# Patient Record
Sex: Female | Born: 1950 | Race: White | Hispanic: No | Marital: Married | Smoking: Current some day smoker
Health system: Southern US, Community
[De-identification: ages and names within clinical notes are randomized; demographics above are authoritative.]

## PROBLEM LIST (undated history)

## (undated) DIAGNOSIS — R0600 Dyspnea, unspecified: Secondary | ICD-10-CM

## (undated) DIAGNOSIS — M1990 Unspecified osteoarthritis, unspecified site: Secondary | ICD-10-CM

## (undated) DIAGNOSIS — I4891 Unspecified atrial fibrillation: Secondary | ICD-10-CM

## (undated) DIAGNOSIS — T8209XA Other mechanical complication of heart valve prosthesis, initial encounter: Secondary | ICD-10-CM

## (undated) DIAGNOSIS — I10 Essential (primary) hypertension: Secondary | ICD-10-CM

## (undated) DIAGNOSIS — Z953 Presence of xenogenic heart valve: Secondary | ICD-10-CM

## (undated) DIAGNOSIS — I253 Aneurysm of heart: Secondary | ICD-10-CM

## (undated) DIAGNOSIS — Z9889 Other specified postprocedural states: Secondary | ICD-10-CM

## (undated) DIAGNOSIS — D649 Anemia, unspecified: Secondary | ICD-10-CM

## (undated) DIAGNOSIS — I499 Cardiac arrhythmia, unspecified: Secondary | ICD-10-CM

## (undated) DIAGNOSIS — E7800 Pure hypercholesterolemia, unspecified: Secondary | ICD-10-CM

## (undated) DIAGNOSIS — K219 Gastro-esophageal reflux disease without esophagitis: Secondary | ICD-10-CM

## (undated) DIAGNOSIS — I2699 Other pulmonary embolism without acute cor pulmonale: Secondary | ICD-10-CM

## (undated) DIAGNOSIS — I350 Nonrheumatic aortic (valve) stenosis: Secondary | ICD-10-CM

## (undated) DIAGNOSIS — G510 Bell's palsy: Secondary | ICD-10-CM

## (undated) DIAGNOSIS — H269 Unspecified cataract: Secondary | ICD-10-CM

## (undated) DIAGNOSIS — I729 Aneurysm of unspecified site: Secondary | ICD-10-CM

## (undated) DIAGNOSIS — E669 Obesity, unspecified: Secondary | ICD-10-CM

## (undated) HISTORY — DX: Other mechanical complication of heart valve prosthesis, initial encounter: T82.09XA

## (undated) HISTORY — DX: Nonrheumatic aortic (valve) stenosis: I35.0

## (undated) HISTORY — DX: Unspecified cataract: H26.9

## (undated) HISTORY — DX: Aneurysm of unspecified site: I72.9

## (undated) HISTORY — DX: Aneurysm of heart: I25.3

## (undated) HISTORY — PX: CATARACT EXTRACTION: SUR2

## (undated) HISTORY — PX: PARS PLANA VITRECTOMY W/ REPAIR OF MACULAR HOLE: SHX2170

## (undated) HISTORY — DX: Other specified postprocedural states: Z98.890

## (undated) HISTORY — PX: KNEE ARTHROSCOPY: SUR90

## (undated) HISTORY — PX: EYE SURGERY: SHX253

## (undated) HISTORY — DX: Obesity, unspecified: E66.9

## (undated) HISTORY — DX: Unspecified atrial fibrillation: I48.91

## (undated) HISTORY — DX: Anemia, unspecified: D64.9

## (undated) HISTORY — DX: Bell's palsy: G51.0

## (undated) HISTORY — DX: Essential (primary) hypertension: I10

## (undated) HISTORY — DX: Pure hypercholesterolemia, unspecified: E78.00

## (undated) HISTORY — DX: Other pulmonary embolism without acute cor pulmonale: I26.99

---

## 1997-11-12 ENCOUNTER — Other Ambulatory Visit: Admission: RE | Admit: 1997-11-12 | Discharge: 1997-11-12 | Payer: Self-pay | Admitting: Gynecology

## 1999-10-04 ENCOUNTER — Other Ambulatory Visit: Admission: RE | Admit: 1999-10-04 | Discharge: 1999-10-04 | Payer: Self-pay | Admitting: Gynecology

## 2001-05-30 ENCOUNTER — Other Ambulatory Visit: Admission: RE | Admit: 2001-05-30 | Discharge: 2001-05-30 | Payer: Self-pay | Admitting: Gynecology

## 2002-10-27 ENCOUNTER — Other Ambulatory Visit: Admission: RE | Admit: 2002-10-27 | Discharge: 2002-10-27 | Payer: Self-pay | Admitting: Gynecology

## 2003-03-21 DIAGNOSIS — G510 Bell's palsy: Secondary | ICD-10-CM

## 2003-03-21 HISTORY — DX: Bell's palsy: G51.0

## 2003-09-03 ENCOUNTER — Emergency Department (HOSPITAL_COMMUNITY): Admission: EM | Admit: 2003-09-03 | Discharge: 2003-09-03 | Payer: Self-pay | Admitting: Emergency Medicine

## 2004-08-31 ENCOUNTER — Ambulatory Visit (HOSPITAL_COMMUNITY): Admission: RE | Admit: 2004-08-31 | Discharge: 2004-08-31 | Payer: Self-pay

## 2008-05-04 ENCOUNTER — Ambulatory Visit (HOSPITAL_COMMUNITY): Admission: RE | Admit: 2008-05-04 | Discharge: 2008-05-04 | Payer: Self-pay | Admitting: Internal Medicine

## 2008-05-04 ENCOUNTER — Encounter: Payer: Self-pay | Admitting: Cardiology

## 2008-05-19 ENCOUNTER — Ambulatory Visit: Payer: Self-pay | Admitting: Cardiology

## 2008-05-29 ENCOUNTER — Ambulatory Visit: Payer: Self-pay | Admitting: Cardiology

## 2008-05-29 ENCOUNTER — Ambulatory Visit: Payer: Self-pay

## 2008-05-29 LAB — CONVERTED CEMR LAB
Basophils Relative: 0.6 % (ref 0.0–3.0)
Eosinophils Absolute: 0.1 10*3/uL (ref 0.0–0.7)
Eosinophils Relative: 1.3 % (ref 0.0–5.0)
HDL: 44.4 mg/dL (ref >39.0)
Lymphocytes Relative: 39.2 % (ref 12.0–46.0)
MCV: 91.9 fL (ref 78.0–100.0)
Monocytes Relative: 7.5 % (ref 3.0–12.0)
Neutrophils Relative %: 51.4 % (ref 43.0–77.0)
Platelets: 284 10*3/uL (ref 150–400)
RBC: 4.33 M/uL (ref 3.87–5.11)
Total CHOL/HDL Ratio: 4.7
Triglycerides: 65 mg/dL (ref 0–149)
VLDL: 13 mg/dL (ref 0–40)
WBC: 8.1 10*3/uL (ref 4.5–10.5)

## 2008-06-01 ENCOUNTER — Encounter: Payer: Self-pay | Admitting: Cardiology

## 2008-06-01 ENCOUNTER — Ambulatory Visit: Payer: Self-pay | Admitting: Cardiology

## 2008-06-01 DIAGNOSIS — E7800 Pure hypercholesterolemia, unspecified: Secondary | ICD-10-CM

## 2008-06-01 DIAGNOSIS — I359 Nonrheumatic aortic valve disorder, unspecified: Secondary | ICD-10-CM | POA: Insufficient documentation

## 2008-06-01 DIAGNOSIS — R0602 Shortness of breath: Secondary | ICD-10-CM | POA: Insufficient documentation

## 2008-06-01 DIAGNOSIS — I10 Essential (primary) hypertension: Secondary | ICD-10-CM

## 2008-06-01 DIAGNOSIS — F17200 Nicotine dependence, unspecified, uncomplicated: Secondary | ICD-10-CM | POA: Insufficient documentation

## 2008-07-13 ENCOUNTER — Ambulatory Visit: Payer: Self-pay | Admitting: Cardiology

## 2008-07-13 LAB — CONVERTED CEMR LAB
ALT: 17 units/L (ref 0–35)
Albumin: 3.9 g/dL (ref 3.5–5.2)
Alkaline Phosphatase: 80 units/L (ref 39–117)
Bilirubin, Direct: 0.1 mg/dL (ref 0.0–0.3)
Total Protein: 6.7 g/dL (ref 6.0–8.3)

## 2008-07-17 LAB — CONVERTED CEMR LAB
Cholesterol: 139 mg/dL (ref 0–200)
Magnesium: 2.2 mg/dL (ref 1.5–2.5)
Triglycerides: 41 mg/dL (ref 0.0–149.0)
VLDL: 8.2 mg/dL (ref 0.0–40.0)

## 2008-11-09 ENCOUNTER — Ambulatory Visit (HOSPITAL_BASED_OUTPATIENT_CLINIC_OR_DEPARTMENT_OTHER): Admission: RE | Admit: 2008-11-09 | Discharge: 2008-11-09 | Payer: Self-pay | Admitting: Specialist

## 2009-12-21 ENCOUNTER — Telehealth: Payer: Self-pay | Admitting: Cardiology

## 2010-01-03 ENCOUNTER — Telehealth: Payer: Self-pay | Admitting: Cardiology

## 2010-01-25 ENCOUNTER — Ambulatory Visit: Payer: Self-pay | Admitting: Cardiology

## 2010-01-26 ENCOUNTER — Encounter (INDEPENDENT_AMBULATORY_CARE_PROVIDER_SITE_OTHER): Payer: Self-pay | Admitting: *Deleted

## 2010-01-26 LAB — CONVERTED CEMR LAB
ALT: 15 units/L (ref 0–35)
AST: 18 units/L (ref 0–37)
Alkaline Phosphatase: 88 units/L (ref 39–117)
Total Bilirubin: 0.7 mg/dL (ref 0.3–1.2)
Total CHOL/HDL Ratio: 4
Triglycerides: 71 mg/dL (ref 0.0–149.0)

## 2010-02-09 ENCOUNTER — Ambulatory Visit: Payer: Self-pay | Admitting: Cardiovascular Disease

## 2010-02-09 ENCOUNTER — Ambulatory Visit (HOSPITAL_COMMUNITY): Admission: RE | Admit: 2010-02-09 | Discharge: 2010-02-09 | Payer: Self-pay | Admitting: Cardiology

## 2010-02-09 ENCOUNTER — Telehealth: Payer: Self-pay | Admitting: Cardiology

## 2010-02-09 ENCOUNTER — Ambulatory Visit: Payer: Self-pay

## 2010-02-09 ENCOUNTER — Encounter: Payer: Self-pay | Admitting: Cardiology

## 2010-02-25 ENCOUNTER — Telehealth: Payer: Self-pay | Admitting: Cardiology

## 2010-03-01 ENCOUNTER — Ambulatory Visit (HOSPITAL_COMMUNITY)
Admission: RE | Admit: 2010-03-01 | Discharge: 2010-03-01 | Payer: Self-pay | Source: Home / Self Care | Attending: Internal Medicine | Admitting: Internal Medicine

## 2010-03-07 ENCOUNTER — Telehealth: Payer: Self-pay | Admitting: Cardiology

## 2010-04-10 ENCOUNTER — Encounter: Payer: Self-pay | Admitting: Otolaryngology

## 2010-04-10 ENCOUNTER — Encounter: Payer: Self-pay | Admitting: Cardiology

## 2010-04-21 NOTE — Progress Notes (Signed)
Summary: pt having sob x 1week since starting norvasc   Phone Note Call from Patient   Caller: Patient [phone] Reason for Call: Talk to Nurse Summary of Call: pt having sob since starting new med-pls advise Initial call taken by: Glynda Jaeger,  February 25, 2010 9:00 AM  Follow-up for Phone Call        Left message to call back Deliah Goody, RN  February 25, 2010 12:48 PM  pt returning call call back [phone] Judie Grieve  February 25, 2010 12:54 PM Left message to call back Deliah Goody, RN  February 25, 2010 3:38 PM  Additional Follow-up for Phone Call Additional follow up Details #1::        pt rtn call she is at home now Omer Jack  February 25, 2010 3:52 PM   spoke with pt, she reports since starting the norvasc she has been SOB. she denies edema. she did not take the norvasc today and feels better. will foward for dr Jens Som review Deliah Goody, RN  February 25, 2010 4:43 PM    Additional Follow-up for Phone Call Additional follow up Details #2::    follow BP off norvasc Ferman Hamming, MD, [facility]  February 25, 2010 5:40 PM  pt aware Deliah Goody, RN  February 25, 2010 5:50 PM

## 2010-04-21 NOTE — Progress Notes (Signed)
Summary: order for blood work?   Phone Note Call from Patient   Caller: Patient [phone] ok to leave message Reason for Call: Talk to Nurse Complaint: Cough/Sore throat Summary of Call: pt called to make a past due appt, wants to know if needs blood work prior? just let me know and i'll call her and make her appt Initial call taken by: Glynda Jaeger,  January 03, 2010 9:09 AM  Follow-up for Phone Call        left mess on ID vm doesn't look like labs needed at this time, liver/lipid ok in April Terri Staggers, RN  January 03, 2010 11:06 AM

## 2010-04-21 NOTE — Progress Notes (Signed)
Summary: calling about labs   Phone Note Call from Patient Call back at Home Phone [phone]   Caller: Patient Summary of Call: Pt calling regarding labs,said she have not had labs since last year Initial call taken by: Judie Grieve,  January 03, 2010 3:02 PM  Follow-up for Phone Call        Left message to call back Meredith Staggers, RN  January 03, 2010 3:14 PM   Additional Follow-up for Phone Call Additional follow up Details #1::        rtn your call Omer Jack  January 03, 2010 4:06 PM   spoke w/pt labs were from April of 2010, she is due for liver/lipid she can't come in before appt she will have done same day Meredith Staggers, RN  January 03, 2010 4:54 PM

## 2010-04-21 NOTE — Assessment & Plan Note (Signed)
Summary: follow up appt  Medications Added OMEPRAZOLE 40 MG CPDR (OMEPRAZOLE) 1 tab by mouth once daily SIMVASTATIN 40 MG TABS (SIMVASTATIN) Take one tablet by mouth daily at bedtime METOPROLOL SUCCINATE 50 MG XR24H-TAB (METOPROLOL SUCCINATE) Take one tablet by mouth daily ASPIRIN 81 MG TBEC (ASPIRIN) Take one tablet by mouth daily        CC:  check up.  History of Present Illness: Pleasant female with aortic stenosis who presents in followup. She did have an echocardiogram performed on May 01, 2008. She was found to have normal LV function and moderate aortic stenosis with a mean gradient of 33 mm of mercury. She also had carotid Dopplers on May 29, 2008 that showed 0-39% bilateral stenosis. I last saw her in March 2010. Since then, the patient has dyspnea with more extreme activities but not with routine activities. It is relieved with rest. It is not associated with chest pain. There is no orthopnea, PND or pedal edema. There is no syncope or palpitations. There is no exertional chest pain.   Current Medications (verified): 1)  Omeprazole 40 Mg Cpdr (Omeprazole) .Marland Kitchen.. 1 Tab By Mouth Once Daily 2)  Motrin .... As Needed 3)  Simvastatin 40 Mg Tabs (Simvastatin) .... Take One Tablet By Mouth Daily At Bedtime 4)  Metoprolol Succinate 25 Mg Xr24h-Tab (Metoprolol Succinate) .... Take One Tablet By Mouth Daily As Needed  Past History:  Past Medical History: HYPERTENSION, BENIGN ESSENTIAL  AORTIC STENOSIS  HYPERCHOLESTEROLEMIA-PURE   Social History: Reviewed history from 06/01/2008 and no changes required. Works locally in [employer] Married  Tobacco Use - Yes.   Review of Systems       Occasional myalgias  but no fevers or chills, productive cough, hemoptysis, dysphasia, odynophagia, melena, hematochezia, dysuria, hematuria, rash, seizure activity, orthopnea, PND, pedal edema, claudication. Remaining systems are negative.   Vital Signs:  Patient profile:   60 year old  female Height:      63 inches Weight:      183 pounds BMI:     32.53 Pulse rate:   74 / minute Resp:     14 per minute BP sitting:   153 / 77  (left arm)  Vitals Entered By: Kem Parkinson (January 25, 2010 8:56 AM)  Physical Exam  General:  Well-developed well-nourished in no acute distress.  Skin is warm and dry.  HEENT is normal.  Neck is supple. No thyromegaly.  Chest is clear to auscultation with normal expansion.  Cardiovascular exam is regular rate and rhythm. 3/6 systolic murmur left sternal border radiating to the carotids. Abdominal exam nontender or distended. No masses palpated. Extremities show no edema. neuro grossly intact    EKG  Procedure date:  01/25/2010  Findings:      Sinus rhythm at a rate of 74. Axis normal. No ST changes.  Impression & Recommendations:  Problem # 1:  PURE HYPERCHOLESTEROLEMIA (ICD-272.0)  Continue statin. Check lipids and liver. Her updated medication list for this problem includes:    Simvastatin 40 Mg Tabs (Simvastatin) .Marland Kitchen... Take one tablet by mouth daily at bedtime  Her updated medication list for this problem includes:    Simvastatin 40 Mg Tabs (Simvastatin) .Marland Kitchen... Take one tablet by mouth daily at bedtime  Problem # 2:  HYPERTENSION, BENIGN ESSENTIAL (ICD-401.1) Blood pressure elevated. Increase Toprol to 50 mg p.o. daily. Patient will follow blood pressure at home and we will adjust medicines accordingly. Her updated medication list for this problem includes:    Metoprolol  Succinate 50 Mg Xr24h-tab (Metoprolol succinate) .Marland Kitchen... Take one tablet by mouth daily    Aspirin 81 Mg Tbec (Aspirin) .Marland Kitchen... Take one tablet by mouth daily  Problem # 3:  AORTIC STENOSIS (ICD-424.1)  Repeat echocardiogram. Add ECASA 81 mg by mouth daily. Her updated medication list for this problem includes:    Metoprolol Succinate 50 Mg Xr24h-tab (Metoprolol succinate) .Marland Kitchen... Take one tablet by mouth daily  Her updated medication list for this  problem includes:    Metoprolol Succinate 50 Mg Xr24h-tab (Metoprolol succinate) .Marland Kitchen... Take one tablet by mouth daily  Orders: EKG w/ Interpretation (93[address]) Echocardiogram (Echo)  Problem # 4:  TOBACCO ABUSE (ICD-305.1) Patient counseled on discontinuing.  Problem # 5:  DYSPNEA (ICD-786.05) No change. Instructed on symptoms of progressive aortic stenosis. Her updated medication list for this problem includes:    Metoprolol Succinate 50 Mg Xr24h-tab (Metoprolol succinate) .Marland Kitchen... Take one tablet by mouth daily    Aspirin 81 Mg Tbec (Aspirin) .Marland Kitchen... Take one tablet by mouth daily  Her updated medication list for this problem includes:    Metoprolol Succinate 50 Mg Xr24h-tab (Metoprolol succinate) .Marland Kitchen... Take one tablet by mouth daily  Patient Instructions: 1)  Increase Metoprolol Succ to 50mg  once daily. 2)  Start Aspirin 81mg  once daily. 3)  Labwork today: lipid/liver (272.2). 4)  Your physician has requested that you have an echocardiogram.  Echocardiography is a painless test that uses sound waves to create images of your heart. It provides your doctor with information about the size and shape of your heart and how well your heart's chambers and valves are working.  This procedure takes approximately one hour. There are no restrictions for this procedure. 5)  Your physician wants you to follow-up in: 1 year.  You will receive a reminder letter in the mail two months in advance. If you don't receive a letter, please call our office to schedule the follow-up appointment. Prescriptions: SIMVASTATIN 40 MG TABS (SIMVASTATIN) Take one tablet by mouth daily at bedtime  #30 x 11   Entered by:   Sherri Rad, RN, BSN   Authorized by:   Ferman Hamming, MD, [facility]   Signed by:   Sherri Rad, RN, BSN on 01/25/2010   Method used:   [facility] to        [facility] [phone][facility] (retail)       [address]       [place], [place]  [address]       Ph: [phone] or  [phone]       Fax: [phone]   RxID:   [id number] METOPROLOL SUCCINATE 50 MG XR24H-TAB (METOPROLOL SUCCINATE) Take one tablet by mouth daily  #30 x 11   Entered by:   Sherri Rad, RN, BSN   Authorized by:   Ferman Hamming, MD, [facility]   Signed by:   Sherri Rad, RN, BSN on 01/25/2010   Method used:   [facility]ronically to        [facility] [phone]* (retail)       [address]       [place], [place]  [address]       Ph: [phone] or [phone]       Fax: [phone]   RxID:   [id number]

## 2010-04-21 NOTE — Progress Notes (Signed)
Summary: Questions about Norvasc   Phone Note Call from Patient Call back at [phone]   Caller: Patient Summary of Call: Pt have question about taking Norvasc and pt calling with B/P numbers 153/87 137/82 144/74 146/86 toady Initial call taken by: Judie Grieve,  March 07, 2010 12:39 PM  Follow-up for Phone Call        pt stopped taking Norvasc about 12/10 because it was making her [facility], she thought. Last week she was dx w/bronchitis and sinus infection and started on prednisone.  Note the blood pressures she has provided off of Norvasc. She is wondering since she had an URI, should she go back on Norvasc now or not. Adv would wait for MD to decide.   Follow-up by: Claris Gladden RN,  March 07, 2010 3:02 PM  Additional Follow-up for Phone Call Additional follow up Details #1::        Add norvasc 5 mg by mouth daily Ferman Hamming, MD, [facility]  March 07, 2010 3:11 PM     Additional Follow-up for Phone Call Additional follow up Details #2::    LMTCB Scherrie Bateman, LPN  March 07, 2010 4:53 PM  Additional Follow-up for Phone Call Additional follow up Details #3:: Details for Additional Follow-up Action Taken: [phone] pt calling back. pt states you can leave a message re meds.  I called the pt back and made her aware that Dr. Jens Som would like her to take Norvasc 5mg  once daily. I have advised her to track her bp readings and let us know if they are not controlled on this dose. She verbalizes understanding. Sherri Rad, RN, BSN  March 08, 2010 9:10 AM  Additional Follow-up by: Roe Coombs,  March 08, 2010 8:06 AM

## 2010-04-21 NOTE — Progress Notes (Signed)
Summary: Pt returning call regarding meds   Phone Note Call from Patient Call back at [facility] Phone [phone]   Caller: Patient Summary of Call: Pt returned call Initial call taken by: Judie Grieve,  December 21, 2009 2:26 PM  Follow-up for Phone Call        Patient's spause states was returning a call. I did not find any thing noted on pt's records regarding a call placed to pt. Pt's husband aware . He verbalized understanding. Follow-up by: Ollen Gross, RN, BSN,  December 21, 2009 2:48 PM

## 2010-04-21 NOTE — Progress Notes (Signed)
Summary: b/p reading   Medications Added AMLODIPINE BESYLATE 5 MG TABS (AMLODIPINE BESYLATE) Take one tablet by mouth daily       Phone Note Call from Patient Call back at Home Phone [phone]   Caller: Patient Reason for Call: Talk to Nurse Summary of Call: Pt had echo today, wanted to report her b/p reading .  139/83  on  11/ 9 154/75 on 11/ 10 154 /84 on 11/ 14 146/86 on  11/15 151/82 on 11/16 133/82 on 11/21 145/81 done on today .  Initial call taken by: Lorne Skeens,  February 09, 2010 12:30 PM  Follow-up for Phone Call        will foward for dr Jens Som review Deliah Goody, RN  February 09, 2010 2:36 PM   Additional Follow-up for Phone Call Additional follow up Details #1::        Add norvasc 5 mg by mouth daily Ferman Hamming, MD, [facility]  February 10, 2010 11:27 AM  Left message to call back Deliah Goody, RN  February 11, 2010 9:04 AM  pt aware Deliah Goody, RN  February 11, 2010 4:51 PM     Additional Follow-up for Phone Call Additional follow up Details #2::    pt returning your call you can reach pt at # [phone] Follow-up by: Roe Coombs,  February 11, 2010 11:35 AM  New/Updated Medications: AMLODIPINE BESYLATE 5 MG TABS (AMLODIPINE BESYLATE) Take one tablet by mouth daily Prescriptions: AMLODIPINE BESYLATE 5 MG TABS (AMLODIPINE BESYLATE) Take one tablet by mouth daily  #30 x 12   Entered by:   Deliah Goody, RN   Authorized by:   Ferman Hamming, MD, [facility]   Signed by:   Deliah Goody, RN on 02/11/2010   Method used:   [facility]ronically to        [facility] [phone]* (retail)       [address]       [place], [place]  [address]       Ph: [phone] or [phone]       Fax: [phone]   RxID:   [id number]

## 2010-04-21 NOTE — Letter (Signed)
Summary: Custom - Lipid  [facility], Main Office  [address]   [place], [place] [address]   Phone: [phone]  Fax: [phone]     January 26, 2010 MRN: [id number]   [facility] [address] [place], [place]  [address]   Dear Ms. Feil,  We have reviewed your cholesterol results.  They are as follows:     Total Cholesterol:    187 (Desirable: less than 200)       HDL  Cholesterol:     44.60  (Desirable: greater than 40 for men and 50 for women)       LDL Cholesterol:       128  (Desirable: less than 100 for low risk and less than 70 for moderate to high risk)       Triglycerides:       71.0  (Desirable: less than 150)  Our recommendations include:These numbers look good. Continue on the same medicine. Liver function is normal. Take care, Dr. Darel Hong.    Call our office at the number listed above if you have any questions.  Lowering your LDL cholesterol is important, but it is only one of a large number of "risk factors" that may indicate that you are at risk for heart disease, stroke or other complications of hardening of the arteries.  Other risk factors include:   A.  Cigarette Smoking* B.  High Blood Pressure* C.  Obesity* D.   Low HDL Cholesterol (see yours above)* E.   Diabetes Mellitus (higher risk if your is uncontrolled) F.  Family history of premature heart disease G.  Previous history of stroke or cardiovascular disease    *These are risk factors YOU HAVE CONTROL OVER.  For more information, visit .  There is now evidence that lowering the TOTAL CHOLESTEROL AND LDL CHOLESTEROL can reduce the risk of heart disease.  The American Heart Association recommends the following guidelines for the treatment of elevated cholesterol:  1.  If there is now current heart disease and less than two risk factors, TOTAL CHOLESTEROL should be less than 200 and LDL CHOLESTEROL should be less than 100. 2.  If there is current heart disease  or two or more risk factors, TOTAL CHOLESTEROL should be less than 200 and LDL CHOLESTEROL should be less than 70.  A diet low in cholesterol, saturated fat, and calories is the cornerstone of treatment for elevated cholesterol.  Cessation of smoking and exercise are also important in the management of elevated cholesterol and preventing vascular disease.  Studies have shown that 30 to 60 minutes of physical activity most days can help lower blood pressure, lower cholesterol, and keep your weight at a healthy level.  Drug therapy is used when cholesterol levels do not respond to therapeutic lifestyle changes (smoking cessation, diet, and exercise) and remains unacceptably high.  If medication is started, it is important to have you levels checked periodically to evaluate the need for further treatment options.  Thank you,    [employer] Team

## 2010-06-16 ENCOUNTER — Other Ambulatory Visit: Payer: Self-pay | Admitting: Cardiology

## 2010-06-25 LAB — POCT I-STAT 4, (NA,K, GLUC, HGB,HCT)
HCT: 39 % (ref 36.0–46.0)
Hemoglobin: 13.3 g/dL (ref 12.0–15.0)

## 2010-08-02 NOTE — Op Note (Signed)
[name], Terri Edwards            ACCOUNT NO.:  [id number]   MEDICAL RECORD NO.:  [id number]          PATIENT TYPE:  AMB   LOCATION:  [facility]                         FACILITY[facility]  [facility]   PHYSICIAN:  Jene Every, M.D.    DATE OF BIRTH:  May 24, 1950   DATE OF PROCEDURE:  11/09/2008  DATE OF DISCHARGE:                               OPERATIVE REPORT   PREOPERATIVE DIAGNOSES:  1. Medial meniscus tear of the left knee.  2. Degenerative joint disease.   POSTOPERATIVE DIAGNOSES:  1. Medial meniscus tear of the left knee.  2. Degenerative joint disease.   PROCEDURE PERFORMED:  1. Left knee arthroscopy.  2. Partial medial meniscectomy.  3. Synovectomy.   BRIEF HISTORY:  This is a 60 year old with medial knee pain, locking and  giving way.  Mild degenerative changes seen on her studies.  Meniscus  tear suspected.  She was indicated for diagnosis and treatment by  arthroscopic means.  The risks and benefits discussed, including  bleeding, infection, damage to neurovascular structures, no change in  symptoms, worsening symptoms, need for repeat debridement, anesthetic  complications, etc.   TECHNIQUE:  With the patient in the supine position after induction of  adequate anesthesia and 1 gram of Kefzol, the left lower extremity was  prepped and draped in the usual sterile fashion.  A lateral parapatellar  portal and superomedial parapatellar portal was fashioned with a #11  blade.  Ingress cannula atraumatically placed.  Irrigant was utilized to  insufflate the joint.  Under direct visualization, the medial  parapatellar portal was fashioned with a #11 blade after localization  with an 18 gauge needle, sparing the medial meniscus.  Noted was  extensive complex tearing of the anterior horn of the medial meniscus.  It was attached, however.  I introduced a shaver, a 3.5 and utilized it  to debride and shave this down to a more stable and further contoured  with a basket rongeur and then  ArthroWand.  There was a shear tearing of  this anteriorly of the inner 50% of that from approximately the 6  o'clock position to the 9 o'clock position.  The remainder of the  meniscus was stable to probe palpation without evidence of tearing.  There was minor grade 3 changes of the medial compartment, but no grade  4 changes.  Hypertrophic synovitis was noted and a synovectomy was  performed here as well.   The ACL and PCL were unremarkable.   The lateral compartment revealed normal femoral condyle, tibial plateau  and meniscus stable to probe palpation.   The suprapatellar pouch revealed some patellofemoral degenerative  changes, normal patellofemoral tracking.  A light chondroplasty  performed here.   Next, the knee was copiously lavaged.  I reexamined the medial  compartment in flexion/extension.  The meniscus was stable and without  impingement.  All instrumentation was removed.  The portals were closed  with 4-0 nylon simple sutures.  Quarter-percent Marcaine with  epinephrine was infiltrated in the joint.  The wound was dressed  sterilely.  She was awakened without difficulty and transported to the  recovery room in satisfactory condition.  The patient tolerated the procedure well with no complications.      Jene Every, M.D.  Electronically Signed     JB/MEDQ  D:  11/09/2008  T:  11/09/2008  Job:  [id number]

## 2010-08-02 NOTE — Letter (Signed)
May 19, 2008    Terri Edwards. Terri Sills, MD  [address]  [place], [place] [address]   RE:  Terri, Edwards  MRN:  [id number]  /  DOB:  09/21/1950   Dear Terri Edwards,   It was my pleasure evaluating Terri Edwards in the office today  at your request for aortic stenosis.  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  Her only hospitalizations have  been for childbirth with her babies delivered vaginally.  She has never  undergone surgery.  She has no chronic medical conditions other than  mild GERD for which she takes omeprazole daily.  She does smoke  cigarettes at a rate of 1 pack per day with a total consumption of  approximately 40 pack-years.   She reports an allergy to SULFA drugs.   Her only medications are omeprazole and Motrin on a p.r.n. basis.   A murmur was recently auscultated during a routine physical examination.  She has never been told of a murmur in the past nor any other cardiac  problem.  She has never previously seen a cardiologist.  An  echocardiogram shows moderate aortic stenosis.  There is mild LVH with  normal LV systolic function.   SOCIAL HISTORY:  Works locally in [employer]; married with 1 child; no  excessive use of alcohol.   Family history is positive for a fatal CVA in her father, fatal MI in  her mother, and sister with a murmur.   Review of systems is positive for the need for corrective lenses, prior  bilateral cataract surgery, occasional mild palpitations, intermittent  heartburn.  All other systems reviewed and are negative.   PHYSICAL EXAMINATION:  GENERAL:  Pleasant, somewhat overweight woman in  no acute distress.  VITAL SIGNS:  The weight is 172.  Blood pressure 135/80, heart rate 75  and regular, respirations 14 and unlabored.  HEENT:  EOMs full; normal lids and conjunctivae; normal oral mucosa.  NECK:  No jugular venous distention; possible slight delay in the  carotid upstroke; transmitted murmur  bilaterally.  ENDOCRINE:  No thyromegaly.  HEMATOPOIETIC:  No adenopathy.  SKIN:  No significant lesions.  LUNGS:  Clear.  CARDIAC:  Normal first and second heart sounds; grade 2-3/6 early to mid  peaking systolic ejection murmur at the cardiac base.  Normal PMI.  ABDOMEN:  Soft and nontender; no organomegaly.  EXTREMITIES:  No edema; normal distal pulses.  NEUROLOGIC:  Symmetric strength and tone; normal cranial nerves.   EKG:  Normal sinus rhythm; borderline left atrial abnormality; delayed R-  wave progression.  No prior tracing for comparison.   IMPRESSION:  Terri Edwards has asymptomatic aortic stenosis.  Her  lifestyle is generally sedentary.  She has other vascular risk factors  including cigarette smoking.  She has not been told of hyperlipidemia in  the past.  Since there is some overlap between risk factors for coronary  disease and risk factors for progression of aortic stenosis, she should  be more attentive to these concerns.  She is advised to completely  discontinue cigarette smoking and increase activity.  A lipid profile  will be obtained.  Unless cholesterol values are low, she may benefit  from treatment with a statin drug.  The pathophysiology, physiology, and  likely time course of aortic valve disease were discussed with her.  Due  to the presence of transmitted murmur versus bilateral bruits, carotid  ultrasound will be obtained.  We will check basic  laboratories including  a lipid profile.  If results are good, I will plan to see this nice  woman again in 1 year.  The symptoms of a critical aortic stenosis were  described to her.  She was encouraged call your office or mine  immediately should she develop chest discomfort, lightheadedness,  syncope, or exertional dyspnea.    Sincerely,      Gerrit Friends. Dietrich Pates, MD, [facility]  Electronically Signed    RMR/MedQ  DD: 05/19/2008  DT: 05/20/2008  Job #: [id number]

## 2011-01-16 ENCOUNTER — Other Ambulatory Visit: Payer: Self-pay | Admitting: Cardiology

## 2011-01-16 MED ORDER — METOPROLOL SUCCINATE ER 50 MG PO TB24: 50.0000 mg | ORAL_TABLET | Freq: Every day | ORAL | Status: DC

## 2011-01-16 NOTE — Telephone Encounter (Signed)
Pt calling stating that she needs metoprolol 50 mg called in. Pt said she has two pills left. Pt said she has been trying to get refill since last Wednesday or Thursday.

## 2011-02-07 ENCOUNTER — Ambulatory Visit (INDEPENDENT_AMBULATORY_CARE_PROVIDER_SITE_OTHER): Payer: BC Managed Care – PPO | Admitting: Cardiology

## 2011-02-07 ENCOUNTER — Encounter: Payer: Self-pay | Admitting: Cardiology

## 2011-02-07 DIAGNOSIS — F17200 Nicotine dependence, unspecified, uncomplicated: Secondary | ICD-10-CM

## 2011-02-07 DIAGNOSIS — E7800 Pure hypercholesterolemia, unspecified: Secondary | ICD-10-CM

## 2011-02-07 DIAGNOSIS — I10 Essential (primary) hypertension: Secondary | ICD-10-CM

## 2011-02-07 DIAGNOSIS — I359 Nonrheumatic aortic valve disorder, unspecified: Secondary | ICD-10-CM

## 2011-02-07 MED ORDER — AMLODIPINE BESYLATE 5 MG PO TABS: 5.0000 mg | ORAL_TABLET | Freq: Every day | ORAL | Status: DC

## 2011-02-07 MED ORDER — PRAVASTATIN SODIUM 40 MG PO TABS: 40.0000 mg | ORAL_TABLET | Freq: Every day | ORAL | Status: DC

## 2011-02-07 NOTE — Assessment & Plan Note (Signed)
Blood pressure controlled. Continue present medications. Check potassium and renal function. 

## 2011-02-07 NOTE — Assessment & Plan Note (Signed)
Patient developed myalgias with Zocor. Will discontinue. Try Pravachol 40 mg daily. Check lipids and liver in 6 weeks.

## 2011-02-07 NOTE — Progress Notes (Signed)
[id number] female with aortic stenosis who presents in followup. She did have an echocardiogram performed in Nov 2011. She was found to have normal LV function and moderate aortic stenosis with a mean gradient of 31 mm of mercury. She also had carotid Dopplers on May 29, 2008 that showed 0-39% bilateral stenosis. I last saw her in Nov 2011. Since then, the patient has dyspnea with more extreme activities but not with routine activities. It is relieved with rest. It is not associated with chest pain. There is no orthopnea, PND or pedal edema. There is no syncope or palpitations. There is no exertional chest pain.  Current Outpatient Prescriptions  Medication Sig Dispense Refill  . amLODipine (NORVASC) 5 MG tablet Take 5 mg by mouth daily.        Marland Kitchen aspirin 81 MG tablet Take 81 mg by mouth daily.        . metoprolol succinate (TOPROL-XL) 50 MG 24 hr tablet Take 1 tablet (50 mg total) by mouth daily.  30 tablet  11  . pantoprazole (PROTONIX) 40 MG tablet Take 40 mg by mouth as needed.        . simvastatin (ZOCOR) 40 MG tablet TAKE 1 TABLET BY MOUTH AT BEDTIME  30 tablet  11     Past Medical History  Diagnosis Date  . HTN (hypertension), benign   . Aortic stenosis   . Hypercholesteremia     No past surgical history on file.  History   Social History  . Marital Status: Married    Spouse Name: N/A    Number of Children: N/A  . 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 Smoking status: Current Everyday Smoker  . Smokeless tobacco: Not on file  . Alcohol Use: Not on file  . Drug Use: Not on file  . Sexually Active: Not on file   Other Topics Concern  . Not on file   Social History Narrative  . No narrative on file    ROS: no fevers or chills, productive cough, hemoptysis, dysphasia, odynophagia, melena, hematochezia, dysuria, hematuria, rash, seizure activity, orthopnea, PND, pedal edema, claudication. Remaining systems are  negative.  Physical Exam: Well-developed well-nourished in no acute distress.  Skin is warm and dry.  HEENT is normal.  Neck is supple. No thyromegaly.  Chest is clear to auscultation with normal expansion.  Cardiovascular exam is regular rate and rhythm. 3/6 systolic murmur left sternal border. S2 is not diminished. Abdominal exam nontender or distended. No masses palpated. Extremities show no edema. neuro grossly intact  ECG normal sinus rhythm at a rate of 79. No ST changes.

## 2011-02-07 NOTE — Assessment & Plan Note (Signed)
Symptoms unchanged. Plan repeat echocardiogram. I have explained that she will most likely require aortic valve replacement in the future. We will watch for worsening dyspnea, chest pain or presyncope.

## 2011-02-07 NOTE — Assessment & Plan Note (Signed)
Patient counseled on discontinuing. 

## 2011-02-07 NOTE — Patient Instructions (Addendum)
Your physician has recommended you make the following change in your medication: Start Pravachol  Stop Simvastatin  Your physician recommends that you return for lab work in: Early January for fasting lipid,liver, and bmet  Same day as your ECHO  Your physician has requested that you have an echocardiogram. Echocardiography is a painless test that uses sound waves to create images of your heart. It provides your doctor with information about the size and shape of your heart and how well your heart's chambers and valves are working. This procedure takes approximately one hour. There are no restrictions for this procedure.  Early January same day as fasting labs  Your physician wants you to follow-up in: 1 year with Dr. Jens Som. You will receive a reminder letter in the mail two months in advance. If you don't receive a letter, please call our office to schedule the follow-up appointment.

## 2011-03-27 ENCOUNTER — Ambulatory Visit (HOSPITAL_COMMUNITY): Payer: BC Managed Care – PPO | Attending: Cardiology | Admitting: Radiology

## 2011-03-27 ENCOUNTER — Telehealth: Payer: Self-pay | Admitting: Cardiology

## 2011-03-27 ENCOUNTER — Other Ambulatory Visit (INDEPENDENT_AMBULATORY_CARE_PROVIDER_SITE_OTHER): Payer: BC Managed Care – PPO | Admitting: *Deleted

## 2011-03-27 DIAGNOSIS — E7800 Pure hypercholesterolemia, unspecified: Secondary | ICD-10-CM | POA: Insufficient documentation

## 2011-03-27 DIAGNOSIS — I359 Nonrheumatic aortic valve disorder, unspecified: Secondary | ICD-10-CM | POA: Insufficient documentation

## 2011-03-27 DIAGNOSIS — R0989 Other specified symptoms and signs involving the circulatory and respiratory systems: Secondary | ICD-10-CM | POA: Insufficient documentation

## 2011-03-27 DIAGNOSIS — I10 Essential (primary) hypertension: Secondary | ICD-10-CM | POA: Insufficient documentation

## 2011-03-27 DIAGNOSIS — R0609 Other forms of dyspnea: Secondary | ICD-10-CM | POA: Insufficient documentation

## 2011-03-27 LAB — BASIC METABOLIC PANEL
Chloride: 107 mEq/L (ref 96–112)
Potassium: 4.2 mEq/L (ref 3.5–5.1)

## 2011-03-27 LAB — LIPID PANEL
LDL Cholesterol: 86 mg/dL (ref 0–99)
Total CHOL/HDL Ratio: 3

## 2011-03-28 LAB — HEPATIC FUNCTION PANEL
ALT: 13 U/L (ref 0–35)
AST: 16 U/L (ref 0–37)
Alkaline Phosphatase: 73 U/L (ref 39–117)
Bilirubin, Direct: 0.1 mg/dL (ref 0.0–0.3)
Total Bilirubin: 0.8 mg/dL (ref 0.3–1.2)

## 2011-04-18 ENCOUNTER — Encounter: Payer: Self-pay | Admitting: Cardiology

## 2011-04-18 ENCOUNTER — Ambulatory Visit (INDEPENDENT_AMBULATORY_CARE_PROVIDER_SITE_OTHER): Payer: BC Managed Care – PPO | Admitting: Cardiology

## 2011-04-18 VITALS — BP 131/67 | HR 88 | Ht 62.0 in | Wt 184.0 lb

## 2011-04-18 DIAGNOSIS — I77819 Aortic ectasia, unspecified site: Secondary | ICD-10-CM

## 2011-04-18 DIAGNOSIS — I10 Essential (primary) hypertension: Secondary | ICD-10-CM

## 2011-04-18 DIAGNOSIS — I77810 Thoracic aortic ectasia: Secondary | ICD-10-CM

## 2011-04-18 DIAGNOSIS — I359 Nonrheumatic aortic valve disorder, unspecified: Secondary | ICD-10-CM

## 2011-04-18 DIAGNOSIS — E7800 Pure hypercholesterolemia, unspecified: Secondary | ICD-10-CM

## 2011-04-18 DIAGNOSIS — F17200 Nicotine dependence, unspecified, uncomplicated: Secondary | ICD-10-CM

## 2011-04-18 NOTE — Assessment & Plan Note (Signed)
Patient counseled on discontinuing. 

## 2011-04-18 NOTE — Patient Instructions (Signed)
Your physician recommends that you schedule a follow-up appointment in: 3 MONTHS  CTA OF THE CHEST FOR DILATED AORTIC ROOT

## 2011-04-18 NOTE — Progress Notes (Signed)
   HPI: Pleasant female with aortic stenosis who presents in followup. Last echo in Jan 2013 showed normal LV function, severe AS with mean gradient of 50 mmHg, mild LAE. She also had carotid Dopplers on May 29, 2008 that showed 0-39% bilateral stenosis. I last saw her in Nov 2012. Since then, the patient has dyspnea with more extreme activities but not with routine activities. It is relieved with rest. It is not associated with chest pain. There is no orthopnea, PND or pedal edema. There is no syncope or palpitations. There is no exertional chest pain.   Current Outpatient Prescriptions  Medication Sig Dispense Refill  . amLODipine (NORVASC) 5 MG tablet Take 1 tablet (5 mg total) by mouth daily.  30 tablet  12  . aspirin 81 MG tablet Take 81 mg by mouth daily.        . metoprolol succinate (TOPROL-XL) 50 MG 24 hr tablet Take 1 tablet (50 mg total) by mouth daily.  30 tablet  11  . pantoprazole (PROTONIX) 40 MG tablet Take 40 mg by mouth as needed.        . pravastatin (PRAVACHOL) 40 MG tablet Take 1 tablet (40 mg total) by mouth daily.  30 tablet  12     Past Medical History  Diagnosis Date  . HTN (hypertension), benign   . Aortic stenosis   . Hypercholesteremia     No past surgical history on file.  History   Social History  . Marital Status: Married    Spouse Name: N/A    Number of Children: N/A  . 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 Smoking status: Current Everyday Smoker  . Smokeless tobacco: Not on file  . Alcohol Use: Not on file  . Drug Use: Not on file  . Sexually Active: Not on file   Other Topics Concern  . Not on file   Social History Narrative  . No narrative on file    ROS: no fevers or chills, productive cough, hemoptysis, dysphasia, odynophagia, melena, hematochezia, dysuria, hematuria, rash, seizure activity, orthopnea, PND, pedal edema, claudication. Remaining systems are negative.  Physical  Exam: Well-developed well-nourished in no acute distress.  Skin is warm and dry.  HEENT is normal.  Neck is supple. No thyromegaly.  Chest is clear to auscultation with normal expansion.  Cardiovascular exam is regular rate and rhythm. 3 over 6 systolic murmur left sternal border radiating to the carotids. Abdominal exam nontender or distended. No masses palpated. Extremities show no edema. neuro grossly intact

## 2011-04-18 NOTE — Assessment & Plan Note (Signed)
Blood pressure controlled. Continue present medications. 

## 2011-04-18 NOTE — Assessment & Plan Note (Signed)
Continue statin. 

## 2011-04-18 NOTE — Assessment & Plan Note (Signed)
Patient's echocardiogram demonstrates severe aortic stenosis. I discussed dyspnea, chest pain and syncope with her. She feels her symptoms are stable. I will see her back in 3 months. If she develops any of the above symptoms I have asked her to contact me. She understands she will require aortic valve replacement in the near future. Repeat echocardiogram in 6 months. Plan CTA of her aortic root to rule out aneurysm.

## 2011-04-21 ENCOUNTER — Ambulatory Visit (INDEPENDENT_AMBULATORY_CARE_PROVIDER_SITE_OTHER)
Admission: RE | Admit: 2011-04-21 | Discharge: 2011-04-21 | Disposition: A | Discharge status: Home / Self Care | Payer: BC Managed Care – PPO | Source: Ambulatory Visit | Attending: Cardiology | Admitting: Cardiology

## 2011-04-21 DIAGNOSIS — I77810 Thoracic aortic ectasia: Secondary | ICD-10-CM

## 2011-04-21 DIAGNOSIS — I77819 Aortic ectasia, unspecified site: Secondary | ICD-10-CM

## 2011-04-21 MED ORDER — IOHEXOL 300 MG/ML  SOLN
100.0000 mL | Freq: Once | INTRAMUSCULAR | Status: AC | PRN
  Administered 2011-04-21: 100 mL via INTRAVENOUS

## 2011-04-24 ENCOUNTER — Telehealth: Payer: Self-pay | Admitting: Cardiology

## 2011-04-24 DIAGNOSIS — E278 Other specified disorders of adrenal gland: Secondary | ICD-10-CM

## 2011-04-24 NOTE — Telephone Encounter (Signed)
F/U   Patient returning nurse call, she can be reached on cell# [phone]

## 2011-04-24 NOTE — Telephone Encounter (Signed)
New problem Pt wants to know results of test she had on Friday and setting her up for surgery

## 2011-04-25 ENCOUNTER — Telehealth: Payer: Self-pay | Admitting: Cardiology

## 2011-04-25 NOTE — Telephone Encounter (Signed)
Discussed pt with dr Jens Som, pt is not ready for valve replacement unless her SOB changes and then she knows to call. Will schedule pt for MRI to assess adrenal mass.

## 2011-04-25 NOTE — Telephone Encounter (Signed)
MRA of the abdomen w/wo contrast scheduled for Friday 04-28-11 @ 8pm. The pt will need to arrive at 7:45pm.NPO 4 hours prior. Pt aware and given number to radiology at [place] if needs to be rescheduled. Pre-cert made aware.

## 2011-04-25 NOTE — Telephone Encounter (Signed)
Left message for pt, MRI is of the abdomen only. Pre-cert has been started

## 2011-04-25 NOTE — Telephone Encounter (Signed)
Pt calling to see if MRI is full body? And has insurance approved this? [phone] or [phone]

## 2011-04-28 ENCOUNTER — Other Ambulatory Visit (HOSPITAL_COMMUNITY): Payer: BC Managed Care – PPO

## 2011-04-29 ENCOUNTER — Ambulatory Visit (HOSPITAL_COMMUNITY)
Admission: RE | Admit: 2011-04-29 | Discharge: 2011-04-29 | Disposition: A | Discharge status: Home / Self Care | Payer: BC Managed Care – PPO | Source: Ambulatory Visit | Attending: Cardiology | Admitting: Cardiology

## 2011-04-29 ENCOUNTER — Other Ambulatory Visit: Payer: Self-pay | Admitting: Cardiology

## 2011-04-29 DIAGNOSIS — D739 Disease of spleen, unspecified: Secondary | ICD-10-CM | POA: Insufficient documentation

## 2011-04-29 DIAGNOSIS — N9489 Other specified conditions associated with female genital organs and menstrual cycle: Secondary | ICD-10-CM | POA: Insufficient documentation

## 2011-04-29 DIAGNOSIS — E278 Other specified disorders of adrenal gland: Secondary | ICD-10-CM

## 2011-04-29 DIAGNOSIS — K862 Cyst of pancreas: Secondary | ICD-10-CM | POA: Insufficient documentation

## 2011-04-29 DIAGNOSIS — N281 Cyst of kidney, acquired: Secondary | ICD-10-CM | POA: Insufficient documentation

## 2011-04-29 DIAGNOSIS — D3500 Benign neoplasm of unspecified adrenal gland: Secondary | ICD-10-CM | POA: Insufficient documentation

## 2011-06-15 ENCOUNTER — Encounter: Payer: Self-pay | Admitting: *Deleted

## 2011-06-26 ENCOUNTER — Ambulatory Visit (INDEPENDENT_AMBULATORY_CARE_PROVIDER_SITE_OTHER)
Admission: RE | Admit: 2011-06-26 | Discharge: 2011-06-26 | Disposition: A | Discharge status: Home / Self Care | Payer: BC Managed Care – PPO | Source: Ambulatory Visit | Attending: Cardiology | Admitting: Cardiology

## 2011-06-26 ENCOUNTER — Encounter: Payer: Self-pay | Admitting: *Deleted

## 2011-06-26 ENCOUNTER — Other Ambulatory Visit: Payer: Self-pay | Admitting: Cardiology

## 2011-06-26 ENCOUNTER — Encounter: Payer: Self-pay | Admitting: Cardiology

## 2011-06-26 ENCOUNTER — Ambulatory Visit (INDEPENDENT_AMBULATORY_CARE_PROVIDER_SITE_OTHER): Payer: BC Managed Care – PPO | Admitting: Cardiology

## 2011-06-26 VITALS — BP 133/79 | HR 72 | Ht 62.0 in | Wt 186.0 lb

## 2011-06-26 DIAGNOSIS — E7800 Pure hypercholesterolemia, unspecified: Secondary | ICD-10-CM

## 2011-06-26 DIAGNOSIS — F17200 Nicotine dependence, unspecified, uncomplicated: Secondary | ICD-10-CM

## 2011-06-26 DIAGNOSIS — Z01810 Encounter for preprocedural cardiovascular examination: Secondary | ICD-10-CM

## 2011-06-26 DIAGNOSIS — I359 Nonrheumatic aortic valve disorder, unspecified: Secondary | ICD-10-CM

## 2011-06-26 DIAGNOSIS — I10 Essential (primary) hypertension: Secondary | ICD-10-CM

## 2011-06-26 LAB — BASIC METABOLIC PANEL
BUN: 12 mg/dL (ref 6–23)
CO2: 29 mEq/L (ref 19–32)
Calcium: 9.1 mg/dL (ref 8.4–10.5)
Chloride: 105 mEq/L (ref 96–112)
Creatinine, Ser: 0.8 mg/dL (ref 0.4–1.2)

## 2011-06-26 LAB — CBC WITH DIFFERENTIAL/PLATELET
Basophils Absolute: 0 10*3/uL (ref 0.0–0.1)
Eosinophils Absolute: 0.1 10*3/uL (ref 0.0–0.7)
Lymphocytes Relative: 38.4 % (ref 12.0–46.0)
MCHC: 33.3 g/dL (ref 30.0–36.0)
MCV: 92.6 fl (ref 78.0–100.0)
Monocytes Absolute: 0.6 10*3/uL (ref 0.1–1.0)
Neutrophils Relative %: 51.7 % (ref 43.0–77.0)
Platelets: 289 10*3/uL (ref 150.0–400.0)
RBC: 4.47 Mil/uL (ref 3.87–5.11)
RDW: 14.1 % (ref 11.5–14.6)

## 2011-06-26 LAB — PROTIME-INR
INR: 0.9 ratio (ref 0.8–1.0)
Prothrombin Time: 10.3 s (ref 10.2–12.4)

## 2011-06-26 NOTE — Progress Notes (Signed)
   [id number] female with aortic stenosis who presents in followup. Last echo in Jan 2013 showed normal LV function, severe AS with mean gradient of 50 mmHg, mild LAE. She also had carotid Dopplers on May 29, 2008 that showed 0-39% bilateral stenosis. CTA in Jan 2013 showed no aneurysm. I last saw her in Jan 2013. Since then, she is describing increasing dyspnea on exertion. There is no orthopnea, PND, pedal edema. She occasionally feels left-sided chest pain with bending over that resolves immediately. She does not have exertional chest pain. She is having occasional dizziness but dominantly with changing positions. No syncope.  Current Outpatient Prescriptions  Medication Sig Dispense Refill  . amLODipine (NORVASC) 5 MG tablet Take 1 tablet (5 mg total) by mouth daily.  30 tablet  12  . aspirin 81 MG tablet Take 81 mg by mouth daily.        . pantoprazole (PROTONIX) 40 MG tablet Take 40 mg by mouth as needed.        . pravastatin (PRAVACHOL) 40 MG tablet Take 1 tablet (40 mg total) by mouth daily.  30 tablet  12  . metoprolol succinate (TOPROL-XL) 50 MG 24 hr tablet Take 1 tablet (50 mg total) by mouth daily.  30 tablet  11     Past Medical History  Diagnosis Date  . HTN (hypertension), benign   . Aortic stenosis   . Hypercholesteremia     No past surgical history on file.  History   Social History  . Marital Status: Married    Spouse Name: N/A    Number of Children: N/A  . 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 Smoking status: Current Everyday Smoker  . Smokeless tobacco: Not on file  . Alcohol Use: Not on file  . Drug Use: Not on file  . Sexually Active: Not on file   Other Topics Concern  . Not on file   Social History Narrative  . No narrative on file    ROS: no fevers or chills, productive cough, hemoptysis, dysphasia, odynophagia, melena, hematochezia, dysuria, hematuria, rash, seizure activity, orthopnea,  PND, pedal edema, claudication. Remaining systems are negative.  Physical Exam: Well-developed well-nourished in no acute distress.  Skin is warm and dry.  HEENT is normal.  Neck is supple.  Chest is clear to auscultation with normal expansion.  Cardiovascular exam is regular rate and rhythm. 3/6 systolic murmur left sternal border radiating to the carotids. Abdominal exam nontender or distended. No masses palpated. Extremities show no edema. neuro grossly intact  ECG sinus rhythm at a rate of 72. Right axis deviation. Nonspecific ST changes.

## 2011-06-26 NOTE — Patient Instructions (Addendum)
Your physician recommends that you schedule a follow-up appointment in: 3 MONTHS   Your physician has requested that you have a cardiac catheterization. Cardiac catheterization is used to diagnose and/or treat various heart conditions. Doctors may recommend this procedure for a number of different reasons. The most common reason is to evaluate chest pain. Chest pain can be a symptom of coronary artery disease (CAD), and cardiac catheterization can show whether plaque is narrowing or blocking your heart's arteries. This procedure is also used to evaluate the valves, as well as measure the blood flow and oxygen levels in different parts of your heart. For further information please visit [email or web address] Please follow instruction sheet, as given.   A chest x-ray takes a picture of the organs and structures inside the chest, including the heart, lungs, and blood vessels. This test can show several things, including, whether the heart is enlarges; whether fluid is building up in the lungs; and whether pacemaker / defibrillator leads are still in place. AT [address]  Your physician recommends that you HAVE LAB WORK TODAY  REFERRAL TO DR Cornelius Moras AT [facility] FOR AVR

## 2011-06-26 NOTE — Assessment & Plan Note (Signed)
The patient is having worsening dyspnea on exertion and dizziness predominantly with changing positions but occasionally without. Feel aortic valve replacement is indicated. Schedule right and left heart catheterization. The risks and benefits including myocardial infarction, CVA and death discussed and patient agrees to proceed. Will arrange appointment with cardiothoracic surgery to arrange aortic valve replacement. I have asked her to limit her activities until surgery is complete.

## 2011-06-26 NOTE — Assessment & Plan Note (Signed)
Patient counseled on discontinuing. 

## 2011-06-26 NOTE — Assessment & Plan Note (Signed)
Blood pressure controlled. Continue present medications. 

## 2011-06-26 NOTE — Assessment & Plan Note (Signed)
Continue statin. 

## 2011-06-27 ENCOUNTER — Encounter (HOSPITAL_BASED_OUTPATIENT_CLINIC_OR_DEPARTMENT_OTHER)
Admission: RE | Disposition: A | Discharge status: Home / Self Care | Payer: Self-pay | Source: Ambulatory Visit | Attending: Cardiovascular Disease

## 2011-06-27 ENCOUNTER — Inpatient Hospital Stay (HOSPITAL_BASED_OUTPATIENT_CLINIC_OR_DEPARTMENT_OTHER)
Admission: RE | Admit: 2011-06-27 | Discharge: 2011-06-27 | Disposition: A | Discharge status: Home / Self Care | Payer: BC Managed Care – PPO | Source: Ambulatory Visit | Attending: Cardiovascular Disease | Admitting: Cardiovascular Disease

## 2011-06-27 DIAGNOSIS — I359 Nonrheumatic aortic valve disorder, unspecified: Secondary | ICD-10-CM | POA: Insufficient documentation

## 2011-06-27 DIAGNOSIS — E7800 Pure hypercholesterolemia, unspecified: Secondary | ICD-10-CM | POA: Insufficient documentation

## 2011-06-27 DIAGNOSIS — I739 Peripheral vascular disease, unspecified: Secondary | ICD-10-CM

## 2011-06-27 DIAGNOSIS — I10 Essential (primary) hypertension: Secondary | ICD-10-CM | POA: Insufficient documentation

## 2011-06-27 LAB — POCT I-STAT 3, VENOUS BLOOD GAS (G3P V)
pCO2, Ven: 48.3 mmHg (ref 45.0–50.0)
pH, Ven: 7.347 — ABNORMAL HIGH (ref 7.250–7.300)

## 2011-06-27 LAB — POCT I-STAT 3, ART BLOOD GAS (G3+)
O2 Saturation: 92 %
TCO2: 27 mmol/L (ref 0–100)

## 2011-06-27 SURGERY — JV LEFT AND RIGHT HEART CATHETERIZATION WITH CORONARY/GRAFT ANGIOGRAM
Anesthesia: Moderate Sedation

## 2011-06-27 MED ORDER — SODIUM CHLORIDE 0.9 % IV SOLN: 250.0000 mL | INTRAVENOUS | Status: DC | PRN

## 2011-06-27 MED ORDER — ASPIRIN 81 MG PO CHEW
324.0000 mg | CHEWABLE_TABLET | ORAL | Status: AC
  Administered 2011-06-27: 324 mg via ORAL

## 2011-06-27 MED ORDER — SODIUM CHLORIDE 0.9 % IV SOLN
1.0000 mL/kg/h | INTRAVENOUS | Status: DC
  Administered 2011-06-27: 1 mL/kg/h via INTRAVENOUS

## 2011-06-27 MED ORDER — SODIUM CHLORIDE 0.9 % IV SOLN: INTRAVENOUS | Status: AC

## 2011-06-27 MED ORDER — SODIUM CHLORIDE 0.9 % IJ SOLN: 3.0000 mL | Freq: Two times a day (BID) | INTRAMUSCULAR | Status: DC

## 2011-06-27 MED ORDER — SODIUM CHLORIDE 0.9 % IJ SOLN: 3.0000 mL | INTRAMUSCULAR | Status: DC | PRN

## 2011-06-27 MED ORDER — DIAZEPAM 2 MG PO TABS: 2.0000 mg | ORAL_TABLET | ORAL | Status: DC

## 2011-06-27 MED ORDER — ACETAMINOPHEN 325 MG PO TABS: 650.0000 mg | ORAL_TABLET | ORAL | Status: DC | PRN

## 2011-06-27 MED ORDER — DIAZEPAM 5 MG PO TABS: 5.0000 mg | ORAL_TABLET | Freq: Once | ORAL | Status: DC

## 2011-06-27 MED ORDER — ONDANSETRON HCL 4 MG/2ML IJ SOLN: 4.0000 mg | Freq: Four times a day (QID) | INTRAMUSCULAR | Status: DC | PRN

## 2011-06-27 NOTE — H&P (View-Only) (Signed)
   HPI:Pleasant female with aortic stenosis who presents in followup. Last echo in Jan 2013 showed normal LV function, severe AS with mean gradient of 50 mmHg, mild LAE. She also had carotid Dopplers on May 29, 2008 that showed 0-39% bilateral stenosis. CTA in Jan 2013 showed no aneurysm. I last saw her in Jan 2013. Since then, she is describing increasing dyspnea on exertion. There is no orthopnea, PND, pedal edema. She occasionally feels left-sided chest pain with bending over that resolves immediately. She does not have exertional chest pain. She is having occasional dizziness but dominantly with changing positions. No syncope.  Current Outpatient Prescriptions  Medication Sig Dispense Refill  . amLODipine (NORVASC) 5 MG tablet Take 1 tablet (5 mg total) by mouth daily.  30 tablet  12  . aspirin 81 MG tablet Take 81 mg by mouth daily.        . pantoprazole (PROTONIX) 40 MG tablet Take 40 mg by mouth as needed.        . pravastatin (PRAVACHOL) 40 MG tablet Take 1 tablet (40 mg total) by mouth daily.  30 tablet  12  . metoprolol succinate (TOPROL-XL) 50 MG 24 hr tablet Take 1 tablet (50 mg total) by mouth daily.  30 tablet  11     Past Medical History  Diagnosis Date  . HTN (hypertension), benign   . Aortic stenosis   . Hypercholesteremia     No past surgical history on file.  History   Social History  . Marital Status: Married    Spouse Name: N/A    Number of Children: N/A  . 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 Smoking status: Current Everyday Smoker  . Smokeless tobacco: Not on file  . Alcohol Use: Not on file  . Drug Use: Not on file  . Sexually Active: Not on file   Other Topics Concern  . Not on file   Social History Narrative  . No narrative on file    ROS: no fevers or chills, productive cough, hemoptysis, dysphasia, odynophagia, melena, hematochezia, dysuria, hematuria, rash, seizure activity, orthopnea,  PND, pedal edema, claudication. Remaining systems are negative.  Physical Exam: Well-developed well-nourished in no acute distress.  Skin is warm and dry.  HEENT is normal.  Neck is supple.  Chest is clear to auscultation with normal expansion.  Cardiovascular exam is regular rate and rhythm. 3/6 systolic murmur left sternal border radiating to the carotids. Abdominal exam nontender or distended. No masses palpated. Extremities show no edema. neuro grossly intact  ECG sinus rhythm at a rate of 72. Right axis deviation. Nonspecific ST changes.     

## 2011-06-27 NOTE — Progress Notes (Signed)
Discharge instructions completed, ambulated to bathroom without bleeding from left groin site.  Discharged to home via wheelchair with husband.

## 2011-06-27 NOTE — Interval H&P Note (Signed)
History and Physical Interval Note:  06/27/2011 8:24 AM  Terri Edwards  has presented today for cardiac cath with the diagnosis of AS  The various methods of treatment have been discussed with the patient and family. After consideration of risks, benefits and other options for treatment, the patient has consented to  Procedure(s) (LRB): JV LEFT AND RIGHT HEART CATHETERIZATION WITH CORONARY/GRAFT ANGIOGRAM (N/A) as a surgical intervention .  The patients' history has been reviewed, patient examined, no change in status, stable for surgery.  I have reviewed the patients' chart and labs.  Questions were answered to the patient's satisfaction.     Krista Som

## 2011-06-27 NOTE — Progress Notes (Signed)
Bedrest begins @ [id number]

## 2011-06-27 NOTE — CV Procedure (Addendum)
    Cardiac Catheterization Operative Report  Terri Edwards [id number] 4/9/20139:17 AM Carylon Perches, MD, MD  Procedure Performed:  1. Left Heart Catheterization 2. Selective Coronary Angiography 3. Right Heart Catheterization 4. Left ventricular angiogram 5. Distal aortogram  Operator: Verne Carrow, MD  Access: Left femoral artery and left femoral vein.   Indication:  Aortic stenosis. Cardiac cath in preparation for AVR.                               Procedure Details: The risks, benefits, complications, treatment options, and expected outcomes were discussed with the patient. The patient and/or family concurred with the proposed plan, giving informed consent. The patient was brought to the cath lab after IV hydration was begun and oral premedication was given. The patient was further sedated with Versed and Fentanyl. The left  groin was prepped and draped in the usual manner. Using the modified Seldinger access technique, a 4 French sheath was placed in the left femoral artery. A 6 French sheath was inserted into the left femoral vein. A multi-purpose catheter was used to perform a right heart catheterization. Standard diagnostic catheters were used to perform selective coronary angiography. A pigtail catheter was used to perform a left ventricular angiogram and a distal aortogram. There were no immediate complications. The patient was taken to the recovery area in stable condition.   Hemodynamic Findings: Ao: 140/69                 LV: 177/14/24 RA:  8              RV:  34/6/12 PA:  33/13 (mean 22)       PCWP: 13 Fick Cardiac Output:4.9 L/min Fick Cardiac Index: 2.7 L/min/m2 Central Aortic Saturation: 92% Pulmonary Artery Saturation: 67%  Aortic valve: Mean gradient . AVA 0.90   Angiographic Findings:  Left main: No obstructive disease.   Left Anterior Descending Artery: Large vessel that courses to the apex and gives off a moderate sized  Diagonal branch.  No evidence of obstructive disease.   Circumflex Artery: Moderate sized vessel with early intermediate branch and a moderate sized OM branch. NO evidence of obstructive disease.   Right Coronary Artery: Moderate sized, dominant vessel with no evidence of obstructive disease.   Left Ventricular Angiogram: LVEF 60%.  Distal aortogram: No evidence of disease.    Impression: 1. No angiographic evidence of CAD 2. Normal LV systolic function 3. Normal distal aorta 4. Severe aortic valve stenosis  Recommendations: Proceed with planning for aortic valve replacement.        Complications:  None; patient tolerated the procedure well.

## 2011-06-29 ENCOUNTER — Encounter: Payer: Self-pay | Admitting: Thoracic Surgery (Cardiothoracic Vascular Surgery)

## 2011-06-29 ENCOUNTER — Other Ambulatory Visit: Payer: Self-pay | Admitting: Thoracic Surgery (Cardiothoracic Vascular Surgery)

## 2011-06-29 ENCOUNTER — Encounter (HOSPITAL_COMMUNITY): Payer: Self-pay | Admitting: Pharmacy Technician

## 2011-06-29 ENCOUNTER — Other Ambulatory Visit: Payer: Self-pay

## 2011-06-29 ENCOUNTER — Institutional Professional Consult (permissible substitution) (INDEPENDENT_AMBULATORY_CARE_PROVIDER_SITE_OTHER): Payer: BC Managed Care – PPO | Admitting: Thoracic Surgery (Cardiothoracic Vascular Surgery)

## 2011-06-29 ENCOUNTER — Encounter: Payer: BC Managed Care – PPO | Admitting: Thoracic Surgery (Cardiothoracic Vascular Surgery)

## 2011-06-29 VITALS — BP 124/68 | HR 74 | Resp 16 | Ht 62.0 in | Wt 186.0 lb

## 2011-06-29 DIAGNOSIS — I359 Nonrheumatic aortic valve disorder, unspecified: Secondary | ICD-10-CM

## 2011-06-29 DIAGNOSIS — E66811 Obesity, class 1: Secondary | ICD-10-CM

## 2011-06-29 DIAGNOSIS — I350 Nonrheumatic aortic (valve) stenosis: Secondary | ICD-10-CM

## 2011-06-29 DIAGNOSIS — E669 Obesity, unspecified: Secondary | ICD-10-CM

## 2011-06-29 HISTORY — DX: Obesity, unspecified: E66.9

## 2011-06-29 HISTORY — DX: Obesity, class 1: E66.811

## 2011-06-29 NOTE — Progress Notes (Signed)
[address]            [name] [address]          [phone]     CARDIOTHORACIC SURGERY CONSULTATION REPORT  Referring Provider is Jens Som, Madolyn Frieze, MD PCP is Carylon Perches, MD, MD  Chief Complaint  Patient presents with  . Aortic Stenosis    Referral from Dr Jens Som for eval on poss.AVR, cardiac cath 06/27/11  . Shortness of Breath  . Dizziness    HPI:  Patient is a 61 year old female who was first noted to have a heart murmur on routine physical exam several years ago. Echocardiogram revealed aortic stenosis and she has been followed by Dr. Jens Som.  Over the past 1-1-1/2 years the patient has developed progressive fatigue and exertional shortness of breath. She now get short of breath with relatively mild physical activity. She has occasional atypical chest tightness that does not seem to be related to physical activity. She has had occasional dizzy spells without syncope. She denies resting shortness of breath, PND, orthopnea, or lower extremity edema. She has frequent palpitations. She recently underwent a followup echocardiogram demonstrating progression of the severity of aortic stenosis with peak velocity across the aortic valve measured 453 cm/s and peak and mean transvalvular gradients estimated 82 and 50 mm mercury respectively. Left ventricular systolic function remains normal. Cardiac catheterization was performed demonstrating normal coronary artery anatomy with no significant coronary artery disease. The patient has been referred for possible elective aortic valve replacement.  Past Medical History  Diagnosis Date  . HTN (hypertension), benign   . Aortic stenosis   . Hypercholesteremia   . Bell's palsy 2005    Past Surgical History  Procedure Date  . Knee arthroplasty     left    Family History  Problem Relation Age of Onset  . Stroke Father   . Heart attack Mother     Social History History  Substance Use Topics  .  Smoking status: Current Everyday Smoker -- 0.5 packs/day    Types: Cigarettes  . Smokeless tobacco: Never Used  . Alcohol Use: No    Current Outpatient Prescriptions  Medication Sig Dispense Refill  . amLODipine (NORVASC) 5 MG tablet Take 1 tablet (5 mg total) by mouth daily.  30 tablet  12  . aspirin 81 MG tablet Take 81 mg by mouth daily.        . metoprolol succinate (TOPROL-XL) 50 MG 24 hr tablet Take 1 tablet (50 mg total) by mouth daily.  30 tablet  11  . pantoprazole (PROTONIX) 40 MG tablet Take 40 mg by mouth as needed.        . pravastatin (PRAVACHOL) 40 MG tablet Take 1 tablet (40 mg total) by mouth daily.  30 tablet  12    Allergies  Allergen Reactions  . Nickel   . Sulfa Antibiotics     Review of Systems:  General:  normal appetite, decreased energy and activity, 15 lb weight gain over 1.5 years  Respiratory:  no cough, no wheezing, no hemoptysis, no pain with inspiration or cough, + exertional shortness of breath  Cardiac:  Occasional brief chest pain or tightness, + exertional SOB, no resting SOB, no PND, no orthopnea, no LE edema, + palpitations, no syncope  GI:   no difficulty swallowing, no hematochezia, no hematemesis, no melena, no constipation, no diarrhea, reflux well controlled on medical therapy  GU:   no dysuria, no urgency, no frequency   Musculoskeletal: no arthritis, + mild arthralgia left knee  Vascular:  no pain suggestive of claudication   Neuro:   no symptoms suggestive of TIA's, no seizures, no headaches, no peripheral neuropathy   Endocrine:  Negative   HEENT:  no loose teeth or painful teeth,  no recent vision changes  Psych:   no anxiety, no depression    Physical Exam:   BP 124/68  Pulse 74  Resp 16  Ht 5\' 2"  (1.575 m)  Wt 186 lb (84.369 kg)  BMI 34.02 kg/m2  SpO2 96%  LMP 04/29/1991  General:  Obese  well-appearing  HEENT:  Unremarkable   Neck:   no JVD, no bruits, no adenopathy   Chest:   clear to auscultation, symmetrical breath  sounds, no wheezes, no rhonchi   CV:   RRR, grade III/VI systolic murmur   Abdomen:  soft, non-tender, no masses   Extremities:  warm, well-perfused, pulses diminished but palpable, no LE edema  Rectal/GU  Deferred  Neuro:   Grossly non-focal and symmetrical throughout  Skin:   Clean and dry, no rashes, no breakdown   Diagnostic Tests:  ------------------------------------------------------------ Transthoracic Echocardiography  Patient: Terri Edwards, Terri Edwards MR #: [id number] Study Date: 03/27/2011  LV EF: 55% - 60% ------------------------------------------------------------ ----------------------------------------------------------- ------------------------------------------------------------ Study Conclusions  - Left ventricle: The cavity size was normal. Wall thickness was increased in a pattern of mild LVH. Systolic function was normal. The estimated ejection fraction was in the range of 55% to 60%. Wall motion was normal; there were no regional wall motion abnormalities. Doppler parameters are consistent with abnormal left ventricular relaxation (grade 1 diastolic dysfunction). - Aortic valve: Valve mobility was restricted. There was severe stenosis. Mean gradient: 50mm Hg (S). Peak gradient: 82mm Hg (S). - Left atrium: The atrium was mildly dilated. ------------------------------------------------------------ Left ventricle: The cavity size was normal. Wall thickness was increased in a pattern of mild LVH. Systolic function was normal. The estimated ejection fraction was in the range of 55% to 60%. Wall motion was normal; there were no regional wall motion abnormalities. Doppler parameters are consistent with abnormal left ventricular relaxation (grade 1 diastolic dysfunction). ------------------------------------------------------------ Aortic valve: Trileaflet; moderately thickened leaflets. Valve mobility was restricted. Doppler: There was severe stenosis. No  regurgitation. Mean gradient: 50mm Hg (S). Peak gradient: 82mm Hg (S). ------------------------------------------------------------ Aorta: Aortic root: The aortic root was normal in size. ------------------------------------------------------------ Mitral valve: Structurally normal valve. Mobility was not restricted. Doppler: Transvalvular velocity was within the normal range. There was no evidence for stenosis. Trivial regurgitation. Peak gradient: 3mm Hg (D). ------------------------------------------------------------ Left atrium: The atrium was mildly dilated. ------------------------------------------------------------ Right ventricle: The cavity size was normal. Systolic function was normal. ------------------------------------------------------------ Pulmonic valve: Doppler: Transvalvular velocity was within the normal range. There was no evidence for stenosis. Trivial regurgitation. ------------------------------------------------------------ Tricuspid valve: Structurally normal valve. Doppler: Transvalvular velocity was within the normal range. Trivial regurgitation. ------------------------------------------------------------ Pulmonary artery: Systolic pressure was within the normal range. ------------------------------------------------------------ Right atrium: The atrium was normal in size. ------------------------------------------------------------ Pericardium: There was no pericardial effusion. ------------------------------------------------------------ Systemic veins: Inferior vena cava: The vessel was normal in size. ------------------------------------------------------------ ------------------------------------------------------------ Prepared and Electronically Authenticated by  Olga Millers, MD, [facility] 2013-01-07T10:29:00.097  Cardiac Catheterization Operative Report  Terri Edwards  [id number]  4/9/20139:17 AM    Hemodynamic Findings:  Ao:  140/69  LV: 177/14/24  RA: 8  RV: 34/6/12  PA: 33/13 (mean 22)  PCWP: 13  Fick Cardiac Output:4.9 L/min  Fick Cardiac Index: 2.7 L/min/m2  Central Aortic Saturation: 92%  Pulmonary Artery Saturation: 67%  Aortic valve: Mean gradient . AVA 0.90  Angiographic Findings:  Left main: No obstructive disease.  Left Anterior Descending Artery: Large vessel that courses to the apex and gives off a moderate sized Diagonal branch. No evidence of obstructive disease.  Circumflex Artery: Moderate sized vessel with early intermediate branch and a moderate sized OM branch. NO evidence of obstructive disease.  Right Coronary Artery: Moderate sized, dominant vessel with no evidence of obstructive disease.  Left Ventricular Angiogram: LVEF 60%.  Distal aortogram: No evidence of disease.   Operator: Verne Carrow, MD   Impression:  Severe symptomatic aortic stenosis with normal left ventricular systolic function and no significant coronary artery disease. The patient's only significant comorbid medical problems include hypertension and significant obesity.   Plan:  The patient was counseled at length regarding surgical alternatives with respect to valve replacement. In particular, discussion was held comparing in contrast and the risks of mechanical valve replacement and the need for lifelong anticoagulation versus use of a bioprosthetic tissue valve and the associated potential for late structural valve deterioration in failure.  This discussion was placed in the context of the patient's particular circumstances, and as a result the patient specifically requests that their valve be replaced using a bioprosthetic tissue valve .  They understand and accept all potential associated risks of surgery including but not limited to risk of death, stroke, myocardial infarction, congestive heart failure, respiratory failure, renal failure, pneumonia, bleeding requiring blood transfusion and or  reexploration, arrhythmia, heart block or bradycardia requiring permanent pacemaker, pleural effusions or other delayed complications related to continued congestive heart failure, or other late complications related to valve replacement.  Alternative surgical approaches have been discussed, including a comparison between conventional sternotomy and minimally-invasive techniques.  The relative risks and benefits of each have been reviewed as they pertain to the patient's specific circumstances.  After discussing the alternatives, the patient specifically requests that her valve be replaced using conventional full median sternotomy. All of her questions been addressed. We plan to proceed with surgery on Wednesday, April 17.      Salvatore Decent. Cornelius Moras, MD 06/29/2011 12:12 PM

## 2011-07-03 ENCOUNTER — Ambulatory Visit (HOSPITAL_COMMUNITY)
Admission: RE | Admit: 2011-07-03 | Discharge: 2011-07-03 | Disposition: A | Discharge status: Home / Self Care | Payer: BC Managed Care – PPO | Source: Ambulatory Visit | Attending: Thoracic Surgery (Cardiothoracic Vascular Surgery) | Admitting: Thoracic Surgery (Cardiothoracic Vascular Surgery)

## 2011-07-03 ENCOUNTER — Encounter (HOSPITAL_COMMUNITY): Payer: Self-pay

## 2011-07-03 ENCOUNTER — Other Ambulatory Visit: Payer: Self-pay

## 2011-07-03 ENCOUNTER — Inpatient Hospital Stay (HOSPITAL_COMMUNITY)
Admission: RE | Admit: 2011-07-03 | Discharge: 2011-07-03 | Disposition: A | Discharge status: Home / Self Care | Payer: BC Managed Care – PPO | Source: Ambulatory Visit | Attending: Thoracic Surgery (Cardiothoracic Vascular Surgery) | Admitting: Thoracic Surgery (Cardiothoracic Vascular Surgery)

## 2011-07-03 ENCOUNTER — Encounter (HOSPITAL_COMMUNITY)
Admission: RE | Admit: 2011-07-03 | Discharge: 2011-07-03 | Disposition: A | Discharge status: Home / Self Care | Payer: BC Managed Care – PPO | Source: Ambulatory Visit | Attending: Thoracic Surgery (Cardiothoracic Vascular Surgery) | Admitting: Thoracic Surgery (Cardiothoracic Vascular Surgery)

## 2011-07-03 ENCOUNTER — Other Ambulatory Visit (HOSPITAL_COMMUNITY): Payer: Self-pay | Admitting: Radiology

## 2011-07-03 VITALS — BP 137/82 | HR 70 | Temp 97.2°F | Resp 20 | Ht 62.0 in | Wt 186.4 lb

## 2011-07-03 DIAGNOSIS — Z01818 Encounter for other preprocedural examination: Secondary | ICD-10-CM | POA: Insufficient documentation

## 2011-07-03 DIAGNOSIS — I359 Nonrheumatic aortic valve disorder, unspecified: Secondary | ICD-10-CM

## 2011-07-03 DIAGNOSIS — Z01812 Encounter for preprocedural laboratory examination: Secondary | ICD-10-CM | POA: Insufficient documentation

## 2011-07-03 DIAGNOSIS — Z01810 Encounter for preprocedural cardiovascular examination: Secondary | ICD-10-CM

## 2011-07-03 DIAGNOSIS — I350 Nonrheumatic aortic (valve) stenosis: Secondary | ICD-10-CM

## 2011-07-03 HISTORY — DX: Unspecified osteoarthritis, unspecified site: M19.90

## 2011-07-03 HISTORY — DX: Gastro-esophageal reflux disease without esophagitis: K21.9

## 2011-07-03 LAB — URINALYSIS, ROUTINE W REFLEX MICROSCOPIC
Glucose, UA: NEGATIVE mg/dL
Leukocytes, UA: NEGATIVE
Nitrite: NEGATIVE
Specific Gravity, Urine: 1.004 — ABNORMAL LOW (ref 1.005–1.030)
pH: 6 (ref 5.0–8.0)

## 2011-07-03 LAB — PULMONARY FUNCTION TEST

## 2011-07-03 LAB — COMPREHENSIVE METABOLIC PANEL
AST: 18 U/L (ref 0–37)
Albumin: 3.9 g/dL (ref 3.5–5.2)
Alkaline Phosphatase: 84 U/L (ref 39–117)
BUN: 11 mg/dL (ref 6–23)
CO2: 22 mEq/L (ref 19–32)
Chloride: 101 mEq/L (ref 96–112)
GFR calc non Af Amer: >90 mL/min (ref >90)
Potassium: 3.6 mEq/L (ref 3.5–5.1)
Total Bilirubin: 0.4 mg/dL (ref 0.3–1.2)

## 2011-07-03 LAB — SURGICAL PCR SCREEN: Staphylococcus aureus: NEGATIVE

## 2011-07-03 LAB — APTT: aPTT: 26 seconds (ref 24–37)

## 2011-07-03 MED ORDER — ALBUTEROL SULFATE (5 MG/ML) 0.5% IN NEBU
2.5000 mg | INHALATION_SOLUTION | Freq: Once | RESPIRATORY_TRACT | Status: AC
  Administered 2011-07-03: 2.5 mg via RESPIRATORY_TRACT

## 2011-07-03 MED ORDER — CHLORHEXIDINE GLUCONATE 4 % EX LIQD: 30.0000 mL | CUTANEOUS | Status: DC

## 2011-07-03 NOTE — Progress Notes (Signed)
Cbc, blood gasses clotted. Per resp and lab. Pt called to see if would come in tomorrow for redraw. Message left on machine at 1625 4.15.13

## 2011-07-03 NOTE — Pre-Procedure Instructions (Addendum)
[address]OLD  07/03/2011   Your procedure is scheduled on:  Wed, April 17 @ 0830  Report to [facility] at 0630 AM.  Call this number if you have problems the morning of surgery: [phone]   Remember:   Do not eat food:After Midnight.  May have clear liquids: up to 4 Hours before arrival.(until 2:30 am)  Clear liquids include soda, tea, black coffee, apple or grape juice, broth,water  Take these medicines the morning of surgery with A SIP OF WATER: Amlodipine,Metoprolol,and Protonix   Do not wear jewelry, make-up or nail polish.  Do not wear lotions, powders, or perfumes.   Do not shave 48 hours prior to surgery.  Do not bring valuables to the hospital.  Contacts, dentures or bridgework may not be worn into surgery.  Leave suitcase in the car. After surgery it may be brought to your room.  For patients admitted to the hospital, checkout time is 11:00 AM the day of discharge.      Special Instructions: Incentive Spirometry - Practice and bring it with you on the day of surgery. and CHG Shower Use Special Wash: 1/2 bottle night before surgery and 1/2 bottle morning of surgery.   Please read over the following fact sheets that you were given: Pain Booklet, Coughing and Deep Breathing, Blood Transfusion Information, Open Heart Packet, MRSA Information and Surgical Site Infection Prevention

## 2011-07-03 NOTE — Progress Notes (Addendum)
Vascular studies, pft's done immed  prior to preop appt.  Office called  , left message for susan to enter dos prep.

## 2011-07-04 LAB — HEMOGLOBIN A1C: Hgb A1c MFr Bld: 5.8 % — ABNORMAL HIGH (ref <5.7)

## 2011-07-04 MED ORDER — NITROGLYCERIN IN D5W 200-5 MCG/ML-% IV SOLN
2.0000 ug/min | INTRAVENOUS | Status: DC
  Filled 2011-07-04: qty 250

## 2011-07-04 MED ORDER — TRANEXAMIC ACID (OHS) PUMP PRIME SOLUTION
2.0000 mg/kg | INTRAVENOUS | Status: DC
  Filled 2011-07-04: qty 1.69

## 2011-07-04 MED ORDER — PHENYLEPHRINE HCL 10 MG/ML IJ SOLN
30.0000 ug/min | INTRAVENOUS | Status: DC
  Filled 2011-07-04: qty 2

## 2011-07-04 MED ORDER — METOPROLOL TARTRATE 12.5 MG HALF TABLET: 12.5000 mg | ORAL_TABLET | Freq: Once | ORAL | Status: DC

## 2011-07-04 MED ORDER — EPINEPHRINE HCL 1 MG/ML IJ SOLN
0.5000 ug/min | INTRAVENOUS | Status: DC
  Filled 2011-07-04: qty 4

## 2011-07-04 MED ORDER — DEXTROSE 5 % IV SOLN
750.0000 mg | INTRAVENOUS | Status: DC
  Filled 2011-07-04 (×2): qty 750

## 2011-07-04 MED ORDER — VANCOMYCIN HCL 1000 MG IV SOLR
1500.0000 mg | INTRAVENOUS | Status: AC
  Administered 2011-07-05: 1500 mg via INTRAVENOUS
  Filled 2011-07-04: qty 1500

## 2011-07-04 MED ORDER — DEXTROSE 5 % IV SOLN
1.5000 g | INTRAVENOUS | Status: AC
  Administered 2011-07-05: .75 g via INTRAVENOUS
  Administered 2011-07-05: 1.5 g via INTRAVENOUS
  Filled 2011-07-04 (×2): qty 1.5

## 2011-07-04 MED ORDER — SODIUM CHLORIDE 0.9 % IV SOLN
INTRAVENOUS | Status: DC
  Filled 2011-07-04: qty 1

## 2011-07-04 MED ORDER — MAGNESIUM SULFATE 50 % IJ SOLN
40.0000 meq | INTRAMUSCULAR | Status: DC
  Filled 2011-07-04: qty 10

## 2011-07-04 MED ORDER — DOPAMINE-DEXTROSE 3.2-5 MG/ML-% IV SOLN
2.0000 ug/kg/min | INTRAVENOUS | Status: DC
  Filled 2011-07-04: qty 250

## 2011-07-04 MED ORDER — SODIUM BICARBONATE 8.4 % IV SOLN
INTRAVENOUS | Status: DC
  Filled 2011-07-04: qty 2.5

## 2011-07-04 MED ORDER — POTASSIUM CHLORIDE 2 MEQ/ML IV SOLN
80.0000 meq | INTRAVENOUS | Status: DC
  Filled 2011-07-04: qty 40

## 2011-07-04 MED ORDER — TRANEXAMIC ACID (OHS) BOLUS VIA INFUSION
15.0000 mg/kg | INTRAVENOUS | Status: DC
  Filled 2011-07-04: qty 1266

## 2011-07-04 MED ORDER — TRANEXAMIC ACID 100 MG/ML IV SOLN
1.5000 mg/kg/h | INTRAVENOUS | Status: DC
  Filled 2011-07-04: qty 25

## 2011-07-04 MED ORDER — SODIUM CHLORIDE 0.9 % IV SOLN
0.1000 ug/kg/h | INTRAVENOUS | Status: DC
  Filled 2011-07-04: qty 4

## 2011-07-04 NOTE — Progress Notes (Signed)
Dr. Cornelius Moras paged due to Patient's abg and cbc specimens clotting yesterday to see if it's okay to draw these DOS.  He is fine with that and asked if we would put in the orders to make sure they were done.  Orders entered for cbc and abg per request. Patient notified that we can draw these DOS without delaying her surgery and explained that sometimes if it's a difficult blood draw and the blood is coming slowly that the tendency for clotting is higher.  Patient understands and if okay with this.

## 2011-07-05 ENCOUNTER — Encounter (HOSPITAL_COMMUNITY)
Admission: RE | Disposition: A | Discharge status: Home / Self Care | Payer: Self-pay | Source: Ambulatory Visit | Attending: Thoracic Surgery (Cardiothoracic Vascular Surgery)

## 2011-07-05 ENCOUNTER — Ambulatory Visit (HOSPITAL_COMMUNITY): Payer: BC Managed Care – PPO | Admitting: Anesthesiology

## 2011-07-05 ENCOUNTER — Inpatient Hospital Stay (HOSPITAL_COMMUNITY)
Admission: RE | Admit: 2011-07-05 | Discharge: 2011-07-10 | DRG: 105 | Disposition: A | Discharge status: Home / Self Care | Payer: BC Managed Care – PPO | Source: Ambulatory Visit | Attending: Thoracic Surgery (Cardiothoracic Vascular Surgery) | Admitting: Thoracic Surgery (Cardiothoracic Vascular Surgery)

## 2011-07-05 ENCOUNTER — Encounter (HOSPITAL_COMMUNITY): Payer: Self-pay | Admitting: *Deleted

## 2011-07-05 ENCOUNTER — Inpatient Hospital Stay (HOSPITAL_COMMUNITY): Payer: BC Managed Care – PPO

## 2011-07-05 ENCOUNTER — Encounter (HOSPITAL_COMMUNITY): Payer: Self-pay | Admitting: Anesthesiology

## 2011-07-05 ENCOUNTER — Encounter (HOSPITAL_COMMUNITY): Payer: Self-pay | Admitting: Thoracic Surgery (Cardiothoracic Vascular Surgery)

## 2011-07-05 DIAGNOSIS — Z6834 Body mass index (BMI) 34.0-34.9, adult: Secondary | ICD-10-CM

## 2011-07-05 DIAGNOSIS — Z96659 Presence of unspecified artificial knee joint: Secondary | ICD-10-CM

## 2011-07-05 DIAGNOSIS — I359 Nonrheumatic aortic valve disorder, unspecified: Secondary | ICD-10-CM

## 2011-07-05 DIAGNOSIS — E8770 Fluid overload, unspecified: Secondary | ICD-10-CM

## 2011-07-05 DIAGNOSIS — Z953 Presence of xenogenic heart valve: Secondary | ICD-10-CM

## 2011-07-05 DIAGNOSIS — E7800 Pure hypercholesterolemia, unspecified: Secondary | ICD-10-CM | POA: Diagnosis present

## 2011-07-05 DIAGNOSIS — Z7982 Long term (current) use of aspirin: Secondary | ICD-10-CM

## 2011-07-05 DIAGNOSIS — D62 Acute posthemorrhagic anemia: Secondary | ICD-10-CM | POA: Diagnosis not present

## 2011-07-05 DIAGNOSIS — I350 Nonrheumatic aortic (valve) stenosis: Secondary | ICD-10-CM

## 2011-07-05 DIAGNOSIS — J90 Pleural effusion, not elsewhere classified: Secondary | ICD-10-CM | POA: Diagnosis not present

## 2011-07-05 DIAGNOSIS — F17200 Nicotine dependence, unspecified, uncomplicated: Secondary | ICD-10-CM | POA: Diagnosis present

## 2011-07-05 DIAGNOSIS — E8779 Other fluid overload: Secondary | ICD-10-CM | POA: Diagnosis not present

## 2011-07-05 DIAGNOSIS — E785 Hyperlipidemia, unspecified: Secondary | ICD-10-CM | POA: Diagnosis present

## 2011-07-05 DIAGNOSIS — R05 Cough: Secondary | ICD-10-CM | POA: Diagnosis not present

## 2011-07-05 DIAGNOSIS — J9819 Other pulmonary collapse: Secondary | ICD-10-CM | POA: Diagnosis not present

## 2011-07-05 DIAGNOSIS — Z882 Allergy status to sulfonamides status: Secondary | ICD-10-CM

## 2011-07-05 DIAGNOSIS — Z79899 Other long term (current) drug therapy: Secondary | ICD-10-CM

## 2011-07-05 DIAGNOSIS — E669 Obesity, unspecified: Secondary | ICD-10-CM | POA: Diagnosis present

## 2011-07-05 DIAGNOSIS — R059 Cough, unspecified: Secondary | ICD-10-CM | POA: Diagnosis not present

## 2011-07-05 DIAGNOSIS — I10 Essential (primary) hypertension: Secondary | ICD-10-CM | POA: Diagnosis present

## 2011-07-05 DIAGNOSIS — D696 Thrombocytopenia, unspecified: Secondary | ICD-10-CM | POA: Diagnosis not present

## 2011-07-05 HISTORY — PX: AORTIC VALVE REPLACEMENT: SHX41

## 2011-07-05 HISTORY — DX: Presence of xenogenic heart valve: Z95.3

## 2011-07-05 LAB — PLATELET COUNT: Platelets: 196 10*3/uL (ref 150–400)

## 2011-07-05 LAB — POCT I-STAT 4, (NA,K, GLUC, HGB,HCT)
Glucose, Bld: 138 mg/dL — ABNORMAL HIGH (ref 70–99)
Glucose, Bld: 91 mg/dL (ref 70–99)
Glucose, Bld: 103 mg/dL — ABNORMAL HIGH (ref 70–99)
HCT: 26 % — ABNORMAL LOW (ref 36.0–46.0)
HCT: 34 % — ABNORMAL LOW (ref 36.0–46.0)
HCT: 29 % — ABNORMAL LOW (ref 36.0–46.0)
Hemoglobin: 9.9 g/dL — ABNORMAL LOW (ref 12.0–15.0)
Potassium: 4.2 mEq/L (ref 3.5–5.1)
Potassium: 3.9 mEq/L (ref 3.5–5.1)
Potassium: 4.6 mEq/L (ref 3.5–5.1)
Potassium: 3.6 mEq/L (ref 3.5–5.1)
Potassium: 3.9 mEq/L (ref 3.5–5.1)
Sodium: 141 mEq/L (ref 135–145)
Sodium: 142 mEq/L (ref 135–145)
Sodium: 139 mEq/L (ref 135–145)

## 2011-07-05 LAB — CBC
HCT: 40.5 % (ref 36.0–46.0)
HCT: 27.7 % — ABNORMAL LOW (ref 36.0–46.0)
HCT: 29.8 % — ABNORMAL LOW (ref 36.0–46.0)
Hemoglobin: 13.5 g/dL (ref 12.0–15.0)
Hemoglobin: 9.3 g/dL — ABNORMAL LOW (ref 12.0–15.0)
MCH: 29.9 pg (ref 26.0–34.0)
MCHC: 33.6 g/dL (ref 30.0–36.0)
MCV: 89.6 fL (ref 78.0–100.0)
MCV: 90 fL (ref 78.0–100.0)
RBC: 4.52 MIL/uL (ref 3.87–5.11)
RBC: 3.31 MIL/uL — ABNORMAL LOW (ref 3.87–5.11)
RDW: 13.7 % (ref 11.5–15.5)
WBC: 9.3 10*3/uL (ref 4.0–10.5)
WBC: 13.4 10*3/uL — ABNORMAL HIGH (ref 4.0–10.5)
WBC: 14.9 10*3/uL — ABNORMAL HIGH (ref 4.0–10.5)

## 2011-07-05 LAB — POCT I-STAT 3, ART BLOOD GAS (G3+)
Acid-Base Excess: 4 mmol/L — ABNORMAL HIGH (ref 0.0–2.0)
Acid-Base Excess: 1 mmol/L (ref 0.0–2.0)
Acid-base deficit: 3 mmol/L — ABNORMAL HIGH (ref 0.0–2.0)
Bicarbonate: 30.2 mEq/L — ABNORMAL HIGH (ref 20.0–24.0)
Bicarbonate: 25.8 mEq/L — ABNORMAL HIGH (ref 20.0–24.0)
Bicarbonate: 24.8 mEq/L — ABNORMAL HIGH (ref 20.0–24.0)
Bicarbonate: 23.9 mEq/L (ref 20.0–24.0)
Bicarbonate: 23.9 mEq/L (ref 20.0–24.0)
O2 Saturation: 100 %
O2 Saturation: 100 %
O2 Saturation: 99 %
O2 Saturation: 98 %
Patient temperature: 34.5
TCO2: 32 mmol/L (ref 0–100)
TCO2: 31 mmol/L (ref 0–100)
TCO2: 26 mmol/L (ref 0–100)
TCO2: 25 mmol/L (ref 0–100)
TCO2: 25 mmol/L (ref 0–100)
pCO2 arterial: 46 mmHg — ABNORMAL HIGH (ref 35.0–45.0)
pCO2 arterial: 42.2 mmHg (ref 35.0–45.0)
pCO2 arterial: 49.2 mmHg — ABNORMAL HIGH (ref 35.0–45.0)
pH, Arterial: 7.423 — ABNORMAL HIGH (ref 7.350–7.400)
pH, Arterial: 7.365 (ref 7.350–7.400)
pH, Arterial: 7.295 — ABNORMAL LOW (ref 7.350–7.400)
pO2, Arterial: 475 mmHg — ABNORMAL HIGH (ref 80.0–100.0)
pO2, Arterial: 79 mmHg — ABNORMAL LOW (ref 80.0–100.0)
pO2, Arterial: 112 mmHg — ABNORMAL HIGH (ref 80.0–100.0)

## 2011-07-05 LAB — BLOOD GAS, ARTERIAL
Acid-Base Excess: 1.9 mmol/L (ref 0.0–2.0)
Bicarbonate: 26.2 mEq/L — ABNORMAL HIGH (ref 20.0–24.0)
TCO2: 27.5 mmol/L (ref 0–100)
pCO2 arterial: 42.2 mmHg (ref 35.0–45.0)

## 2011-07-05 LAB — HEMOGLOBIN AND HEMATOCRIT, BLOOD
HCT: 28.6 % — ABNORMAL LOW (ref 36.0–46.0)
Hemoglobin: 9.8 g/dL — ABNORMAL LOW (ref 12.0–15.0)

## 2011-07-05 LAB — POCT I-STAT, CHEM 8
Creatinine, Ser: 0.6 mg/dL (ref 0.50–1.10)
Glucose, Bld: 123 mg/dL — ABNORMAL HIGH (ref 70–99)
Hemoglobin: 9.5 g/dL — ABNORMAL LOW (ref 12.0–15.0)
Potassium: 4.4 mEq/L (ref 3.5–5.1)

## 2011-07-05 LAB — CREATININE, SERUM
GFR calc Af Amer: >90 mL/min (ref >90)
GFR calc non Af Amer: >90 mL/min (ref >90)

## 2011-07-05 LAB — APTT: aPTT: 39 seconds — ABNORMAL HIGH (ref 24–37)

## 2011-07-05 LAB — PROTIME-INR
INR: 1.52 — ABNORMAL HIGH (ref 0.00–1.49)
Prothrombin Time: 18.6 seconds — ABNORMAL HIGH (ref 11.6–15.2)

## 2011-07-05 SURGERY — REPLACEMENT, AORTIC VALVE, OPEN
Anesthesia: General | Site: Chest | Wound class: Clean

## 2011-07-05 MED ORDER — ACETAMINOPHEN 500 MG PO TABS
1000.0000 mg | ORAL_TABLET | Freq: Four times a day (QID) | ORAL | Status: DC
  Administered 2011-07-06 (×3): 1000 mg via ORAL
  Filled 2011-07-05 (×8): qty 2

## 2011-07-05 MED ORDER — LACTATED RINGERS IV SOLN: INTRAVENOUS | Status: DC

## 2011-07-05 MED ORDER — MIDAZOLAM HCL 2 MG/2ML IJ SOLN: 1.0000 mg | INTRAMUSCULAR | Status: DC | PRN

## 2011-07-05 MED ORDER — LACTATED RINGERS IV SOLN
INTRAVENOUS | Status: DC | PRN
  Administered 2011-07-05 (×2): via INTRAVENOUS

## 2011-07-05 MED ORDER — HEPARIN SODIUM (PORCINE) 1000 UNIT/ML IJ SOLN
INTRAMUSCULAR | Status: DC | PRN
  Administered 2011-07-05: 21000 [IU] via INTRAVENOUS

## 2011-07-05 MED ORDER — PHENYLEPHRINE HCL 10 MG/ML IJ SOLN
0.0000 ug/min | INTRAVENOUS | Status: DC
  Filled 2011-07-05: qty 2

## 2011-07-05 MED ORDER — MIDAZOLAM HCL 2 MG/2ML IJ SOLN: 2.0000 mg | INTRAMUSCULAR | Status: DC | PRN

## 2011-07-05 MED ORDER — BISACODYL 5 MG PO TBEC
10.0000 mg | DELAYED_RELEASE_TABLET | Freq: Every day | ORAL | Status: DC
  Administered 2011-07-06 – 2011-07-09 (×4): 10 mg via ORAL
  Filled 2011-07-05 (×4): qty 2

## 2011-07-05 MED ORDER — VECURONIUM BROMIDE 10 MG IV SOLR
INTRAVENOUS | Status: DC | PRN
  Administered 2011-07-05: 6 mg via INTRAVENOUS

## 2011-07-05 MED ORDER — SODIUM CHLORIDE 0.9 % IJ SOLN: 3.0000 mL | INTRAMUSCULAR | Status: DC | PRN

## 2011-07-05 MED ORDER — PHENYLEPHRINE HCL 10 MG/ML IJ SOLN
20.0000 mg | INTRAVENOUS | Status: DC | PRN
  Administered 2011-07-05: 1 ug/min via INTRAVENOUS

## 2011-07-05 MED ORDER — ACETAMINOPHEN 650 MG RE SUPP
650.0000 mg | RECTAL | Status: AC
  Administered 2011-07-05: 650 mg via RECTAL

## 2011-07-05 MED ORDER — SODIUM CHLORIDE 0.9 % IV SOLN
INTRAVENOUS | Status: DC
  Filled 2011-07-05: qty 1

## 2011-07-05 MED ORDER — MORPHINE SULFATE 2 MG/ML IJ SOLN
1.0000 mg | INTRAMUSCULAR | Status: AC | PRN
  Administered 2011-07-05: 2 mg via INTRAVENOUS
  Administered 2011-07-05 (×4): 1 mg via INTRAVENOUS
  Filled 2011-07-05 (×3): qty 1

## 2011-07-05 MED ORDER — SODIUM CHLORIDE 0.9 % IV SOLN
0.1000 ug/kg/h | INTRAVENOUS | Status: DC
  Administered 2011-07-05: 0.4 ug/kg/h via INTRAVENOUS
  Filled 2011-07-05: qty 2

## 2011-07-05 MED ORDER — SODIUM CHLORIDE 0.9 % IR SOLN
Status: DC | PRN
  Administered 2011-07-05: 3000 mL

## 2011-07-05 MED ORDER — ACETAMINOPHEN 160 MG/5ML PO SOLN: 975.0000 mg | Freq: Four times a day (QID) | ORAL | Status: DC

## 2011-07-05 MED ORDER — SODIUM CHLORIDE 0.9 % IJ SOLN
3.0000 mL | Freq: Two times a day (BID) | INTRAMUSCULAR | Status: DC
  Administered 2011-07-06 (×2): 3 mL via INTRAVENOUS

## 2011-07-05 MED ORDER — OXYCODONE HCL 5 MG PO TABS
5.0000 mg | ORAL_TABLET | ORAL | Status: DC | PRN
  Administered 2011-07-06: 10 mg via ORAL
  Filled 2011-07-05: qty 2

## 2011-07-05 MED ORDER — FENTANYL CITRATE 0.05 MG/ML IJ SOLN: 50.0000 ug | INTRAMUSCULAR | Status: DC | PRN

## 2011-07-05 MED ORDER — SODIUM CHLORIDE 0.45 % IV SOLN
INTRAVENOUS | Status: DC
  Administered 2011-07-05: 20 mL/h via INTRAVENOUS

## 2011-07-05 MED ORDER — BISACODYL 10 MG RE SUPP: 10.0000 mg | Freq: Every day | RECTAL | Status: DC

## 2011-07-05 MED ORDER — PROPOFOL 10 MG/ML IV EMUL
INTRAVENOUS | Status: DC | PRN
  Administered 2011-07-05: 50 mg via INTRAVENOUS
  Administered 2011-07-05: 40 mg via INTRAVENOUS

## 2011-07-05 MED ORDER — ALBUMIN HUMAN 5 % IV SOLN
INTRAVENOUS | Status: DC | PRN
  Administered 2011-07-05 (×2): via INTRAVENOUS

## 2011-07-05 MED ORDER — MAGNESIUM SULFATE 40 MG/ML IJ SOLN
4.0000 g | Freq: Once | INTRAMUSCULAR | Status: AC
  Administered 2011-07-05: 4 g via INTRAVENOUS
  Filled 2011-07-05: qty 100

## 2011-07-05 MED ORDER — SODIUM CHLORIDE 0.9 % IV SOLN
250.0000 mL | INTRAVENOUS | Status: DC
  Administered 2011-07-06: 250 mL via INTRAVENOUS

## 2011-07-05 MED ORDER — MORPHINE SULFATE 4 MG/ML IJ SOLN
2.0000 mg | INTRAMUSCULAR | Status: DC | PRN
  Administered 2011-07-06 – 2011-07-07 (×4): 2 mg via INTRAVENOUS
  Filled 2011-07-05 (×2): qty 1

## 2011-07-05 MED ORDER — SODIUM CHLORIDE 0.9 % IV SOLN: INTRAVENOUS | Status: DC

## 2011-07-05 MED ORDER — SODIUM CHLORIDE 0.9 % IJ SOLN
OROMUCOSAL | Status: DC | PRN
  Administered 2011-07-05: 10:00:00 via TOPICAL

## 2011-07-05 MED ORDER — SODIUM CHLORIDE 0.9 % IV SOLN
INTRAVENOUS | Status: DC | PRN
  Administered 2011-07-05: 13:00:00 via INTRAVENOUS

## 2011-07-05 MED ORDER — PANTOPRAZOLE SODIUM 40 MG PO TBEC
40.0000 mg | DELAYED_RELEASE_TABLET | Freq: Every day | ORAL | Status: DC
  Administered 2011-07-07 – 2011-07-10 (×4): 40 mg via ORAL
  Filled 2011-07-05 (×4): qty 1

## 2011-07-05 MED ORDER — VANCOMYCIN HCL IN DEXTROSE 1-5 GM/200ML-% IV SOLN
1000.0000 mg | Freq: Once | INTRAVENOUS | Status: AC
  Administered 2011-07-05: 1000 mg via INTRAVENOUS
  Filled 2011-07-05: qty 200

## 2011-07-05 MED ORDER — ACETAMINOPHEN 160 MG/5ML PO SOLN: 650.0000 mg | ORAL | Status: AC

## 2011-07-05 MED ORDER — METOPROLOL TARTRATE 25 MG/10 ML ORAL SUSPENSION
12.5000 mg | Freq: Two times a day (BID) | ORAL | Status: DC
  Filled 2011-07-05 (×5): qty 5

## 2011-07-05 MED ORDER — SIMVASTATIN 20 MG PO TABS
20.0000 mg | ORAL_TABLET | Freq: Every day | ORAL | Status: DC
  Administered 2011-07-06 – 2011-07-09 (×3): 20 mg via ORAL
  Filled 2011-07-05 (×6): qty 1

## 2011-07-05 MED ORDER — ASPIRIN 81 MG PO CHEW: 324.0000 mg | CHEWABLE_TABLET | Freq: Every day | ORAL | Status: DC

## 2011-07-05 MED ORDER — METOPROLOL TARTRATE 12.5 MG HALF TABLET
12.5000 mg | ORAL_TABLET | Freq: Two times a day (BID) | ORAL | Status: DC
  Filled 2011-07-05 (×5): qty 1

## 2011-07-05 MED ORDER — ROCURONIUM BROMIDE 100 MG/10ML IV SOLN
INTRAVENOUS | Status: DC | PRN
  Administered 2011-07-05: 70 mg via INTRAVENOUS
  Administered 2011-07-05: 30 mg via INTRAVENOUS

## 2011-07-05 MED ORDER — SUFENTANIL CITRATE 50 MCG/ML IV SOLN
INTRAVENOUS | Status: DC | PRN
  Administered 2011-07-05: 10 ug via INTRAVENOUS
  Administered 2011-07-05 (×2): 30 ug via INTRAVENOUS
  Administered 2011-07-05: 40 ug via INTRAVENOUS
  Administered 2011-07-05: 20 ug via INTRAVENOUS
  Administered 2011-07-05 (×2): 10 ug via INTRAVENOUS

## 2011-07-05 MED ORDER — INSULIN REGULAR BOLUS VIA INFUSION
0.0000 [IU] | Freq: Three times a day (TID) | INTRAVENOUS | Status: DC
  Administered 2011-07-06: 2 [IU] via INTRAVENOUS
  Filled 2011-07-05: qty 10

## 2011-07-05 MED ORDER — TRANEXAMIC ACID 100 MG/ML IV SOLN
2500.0000 mg | INTRAVENOUS | Status: DC | PRN
  Administered 2011-07-05: 15 mg/kg/h via INTRAVENOUS

## 2011-07-05 MED ORDER — SODIUM CHLORIDE 0.9 % IV SOLN
200.0000 ug | INTRAVENOUS | Status: DC | PRN
  Administered 2011-07-05: 0.2 ug/kg/h via INTRAVENOUS

## 2011-07-05 MED ORDER — 0.9 % SODIUM CHLORIDE (POUR BTL) OPTIME
TOPICAL | Status: DC | PRN
  Administered 2011-07-05: 5000 mL

## 2011-07-05 MED ORDER — POTASSIUM CHLORIDE 10 MEQ/50ML IV SOLN
10.0000 meq | INTRAVENOUS | Status: AC
  Administered 2011-07-05 (×2): 10 meq via INTRAVENOUS

## 2011-07-05 MED ORDER — NITROGLYCERIN IN D5W 200-5 MCG/ML-% IV SOLN: 0.0000 ug/min | INTRAVENOUS | Status: DC

## 2011-07-05 MED ORDER — NITROGLYCERIN IN D5W 200-5 MCG/ML-% IV SOLN
INTRAVENOUS | Status: DC | PRN
  Administered 2011-07-05: 10 ug/min via INTRAVENOUS

## 2011-07-05 MED ORDER — DOCUSATE SODIUM 100 MG PO CAPS
200.0000 mg | ORAL_CAPSULE | Freq: Every day | ORAL | Status: DC
  Administered 2011-07-06: 200 mg via ORAL
  Filled 2011-07-05: qty 2

## 2011-07-05 MED ORDER — CALCIUM CHLORIDE 10 % IV SOLN
1.0000 g | Freq: Once | INTRAVENOUS | Status: AC | PRN
  Filled 2011-07-05: qty 10

## 2011-07-05 MED ORDER — FAMOTIDINE IN NACL 20-0.9 MG/50ML-% IV SOLN
20.0000 mg | Freq: Two times a day (BID) | INTRAVENOUS | Status: DC
  Administered 2011-07-05: 20 mg via INTRAVENOUS

## 2011-07-05 MED ORDER — PNEUMOCOCCAL VAC POLYVALENT 25 MCG/0.5ML IJ INJ
0.5000 mL | INJECTION | INTRAMUSCULAR | Status: AC | PRN
  Administered 2011-07-10: 0.5 mL via INTRAMUSCULAR

## 2011-07-05 MED ORDER — DEXTROSE 5 % IV SOLN
1.5000 g | Freq: Two times a day (BID) | INTRAVENOUS | Status: DC
  Administered 2011-07-05 – 2011-07-06 (×3): 1.5 g via INTRAVENOUS
  Filled 2011-07-05 (×4): qty 1.5

## 2011-07-05 MED ORDER — ASPIRIN EC 325 MG PO TBEC
325.0000 mg | DELAYED_RELEASE_TABLET | Freq: Every day | ORAL | Status: DC
  Administered 2011-07-06: 325 mg via ORAL
  Filled 2011-07-05 (×2): qty 1

## 2011-07-05 MED ORDER — METOPROLOL TARTRATE 1 MG/ML IV SOLN: 2.5000 mg | INTRAVENOUS | Status: DC | PRN

## 2011-07-05 MED ORDER — MIDAZOLAM HCL 5 MG/5ML IJ SOLN
INTRAMUSCULAR | Status: DC | PRN
  Administered 2011-07-05: 7 mg via INTRAVENOUS
  Administered 2011-07-05 (×2): 2 mg via INTRAVENOUS
  Administered 2011-07-05: 5 mg via INTRAVENOUS
  Administered 2011-07-05: 4 mg via INTRAVENOUS

## 2011-07-05 MED ORDER — ONDANSETRON HCL 4 MG/2ML IJ SOLN
4.0000 mg | Freq: Four times a day (QID) | INTRAMUSCULAR | Status: DC | PRN
  Administered 2011-07-05 – 2011-07-08 (×4): 4 mg via INTRAVENOUS
  Filled 2011-07-05 (×4): qty 2

## 2011-07-05 MED ORDER — SODIUM CHLORIDE 0.9 % IV SOLN
100.0000 [IU] | INTRAVENOUS | Status: DC | PRN
  Administered 2011-07-05: 2 [IU]/h via INTRAVENOUS

## 2011-07-05 MED ORDER — ALBUMIN HUMAN 5 % IV SOLN
250.0000 mL | INTRAVENOUS | Status: AC | PRN
  Administered 2011-07-05 – 2011-07-06 (×3): 250 mL via INTRAVENOUS
  Filled 2011-07-05: qty 250

## 2011-07-05 MED ORDER — PROTAMINE SULFATE 10 MG/ML IV SOLN
INTRAVENOUS | Status: DC | PRN
  Administered 2011-07-05: 20 mg via INTRAVENOUS

## 2011-07-05 SURGICAL SUPPLY — 86 items
ADAPTER CARDIO PERF ANTE/RETRO (ADAPTER) ×2 IMPLANT
ADH SRG 12 PREFL SYR 3 SPRDR (MISCELLANEOUS)
ADPR PRFSN 84XANTGRD RTRGD (ADAPTER) ×1
APPLICATOR TIP BIOGLUE STANDRD (MISCELLANEOUS) IMPLANT
ATTRACTOMAT 16X20 MAGNETIC DRP (DRAPES) ×2 IMPLANT
BAG DECANTER FOR FLEXI CONT (MISCELLANEOUS) ×2 IMPLANT
BLADE STERNUM SYSTEM 6 (BLADE) ×2 IMPLANT
BLADE SURG 11 STRL SS (BLADE) ×3 IMPLANT
CANISTER SUCTION 2500CC (MISCELLANEOUS) ×2 IMPLANT
CANNULA GUNDRY RCSP 15FR (MISCELLANEOUS) ×2 IMPLANT
CATH CPB KIT OWEN (MISCELLANEOUS) ×2 IMPLANT
CATH HEART VENT LEFT (CATHETERS) ×1 IMPLANT
CATH ROBINSON RED A/P 18FR (CATHETERS) ×6 IMPLANT
CATH THORACIC 36FR (CATHETERS) ×1 IMPLANT
CATH THORACIC 36FR RT ANG (CATHETERS) ×1 IMPLANT
CLOTH BEACON ORANGE TIMEOUT ST (SAFETY) ×2 IMPLANT
CONT SPEC 4OZ CLIKSEAL STRL BL (MISCELLANEOUS) ×1 IMPLANT
COVER MAYO STAND STRL (DRAPES) ×1 IMPLANT
COVER SURGICAL LIGHT HANDLE (MISCELLANEOUS) ×4 IMPLANT
CRADLE DONUT ADULT HEAD (MISCELLANEOUS) ×2 IMPLANT
DRAIN CHANNEL 32F RND 10.7 FF (WOUND CARE) IMPLANT
DRAPE INCISE IOBAN 66X45 STRL (DRAPES) ×2 IMPLANT
DRAPE SLUSH/WARMER DISC (DRAPES) ×2 IMPLANT
DRSG COVADERM 4X14 (GAUZE/BANDAGES/DRESSINGS) ×2 IMPLANT
ELECT REM PT RETURN 9FT ADLT (ELECTROSURGICAL) ×4
ELECTRODE REM PT RTRN 9FT ADLT (ELECTROSURGICAL) ×2 IMPLANT
GLOVE BIO SURGEON STRL SZ 6 (GLOVE) ×3 IMPLANT
GLOVE BIO SURGEON STRL SZ 6.5 (GLOVE) ×4 IMPLANT
GLOVE BIO SURGEON STRL SZ7 (GLOVE) IMPLANT
GLOVE BIO SURGEON STRL SZ7.5 (GLOVE) ×7 IMPLANT
GLOVE BIOGEL PI IND STRL 6.5 (GLOVE) IMPLANT
GLOVE BIOGEL PI INDICATOR 6.5 (GLOVE) ×5
GLOVE ORTHO TXT STRL SZ7.5 (GLOVE) ×6 IMPLANT
GOWN STRL NON-REIN LRG LVL3 (GOWN DISPOSABLE) ×13 IMPLANT
GRAFT PTCH CORMATRIX 7X10 4PLY (Prosthesis & Implant Heart) ×1 IMPLANT
HEMOSTAT POWDER SURGIFOAM 1G (HEMOSTASIS) ×6 IMPLANT
INSERT FOGARTY XLG (MISCELLANEOUS) ×2 IMPLANT
KIT BASIN OR (CUSTOM PROCEDURE TRAY) ×2 IMPLANT
KIT ROOM TURNOVER OR (KITS) ×2 IMPLANT
KIT SUCTION CATH 14FR (SUCTIONS) ×2 IMPLANT
LEAD PACING MYOCARDI (MISCELLANEOUS) ×2 IMPLANT
LINE VENT (MISCELLANEOUS) ×1 IMPLANT
NS IRRIG 1000ML POUR BTL (IV SOLUTION) ×10 IMPLANT
PACK OPEN HEART (CUSTOM PROCEDURE TRAY) ×2 IMPLANT
PAD ARMBOARD 7.5X6 YLW CONV (MISCELLANEOUS) ×4 IMPLANT
SET CARDIOPLEGIA MPS 5001102 (MISCELLANEOUS) ×1 IMPLANT
SET IRRIG TUBING LAPAROSCOPIC (IRRIGATION / IRRIGATOR) ×3 IMPLANT
SPONGE GAUZE 4X4 12PLY (GAUZE/BANDAGES/DRESSINGS) ×3 IMPLANT
SUT BONE WAX W31G (SUTURE) ×2 IMPLANT
SUT ETHIBON 2 0 V 52N 30 (SUTURE) ×2 IMPLANT
SUT ETHIBON EXCEL 2-0 V-5 (SUTURE) IMPLANT
SUT ETHIBOND 2 0 SH (SUTURE)
SUT ETHIBOND 2 0 SH 36X2 (SUTURE) IMPLANT
SUT ETHIBOND 2 0 V4 (SUTURE) IMPLANT
SUT ETHIBOND 2 0V4 GREEN (SUTURE) IMPLANT
SUT ETHIBOND 4 0 RB 1 (SUTURE) IMPLANT
SUT ETHIBOND NAB MH 2-0 36IN (SUTURE) ×4 IMPLANT
SUT ETHIBOND V-5 VALVE (SUTURE) IMPLANT
SUT ETHIBOND X763 2 0 SH 1 (SUTURE) ×8 IMPLANT
SUT MNCRL AB 3-0 PS2 18 (SUTURE) ×4 IMPLANT
SUT PDS AB 1 CTX 36 (SUTURE) ×4 IMPLANT
SUT PROLENE 3 0 SH DA (SUTURE) ×1 IMPLANT
SUT PROLENE 4 0 RB 1 (SUTURE) ×10
SUT PROLENE 4 0 SH DA (SUTURE) ×2 IMPLANT
SUT PROLENE 4-0 RB1 .5 CRCL 36 (SUTURE) IMPLANT
SUT PROLENE 5 0 C 1 36 (SUTURE) IMPLANT
SUT PROLENE 6 0 C 1 30 (SUTURE) ×1 IMPLANT
SUT SILK  1 MH (SUTURE) ×1
SUT SILK 1 MH (SUTURE) ×1 IMPLANT
SUT SILK 2 0 SH CR/8 (SUTURE) IMPLANT
SUT SILK 3 0 SH CR/8 (SUTURE) IMPLANT
SUT STEEL 6MS V (SUTURE) IMPLANT
SUT STEEL SZ 6 DBL 3X14 BALL (SUTURE) ×3 IMPLANT
SUT VIC AB 2-0 CTX 27 (SUTURE) ×3 IMPLANT
SUTURE E-PAK OPEN HEART (SUTURE) ×2 IMPLANT
SYR 10ML KIT SKIN ADHESIVE (MISCELLANEOUS) IMPLANT
SYSTEM SAHARA CHEST DRAIN ATS (WOUND CARE) ×2 IMPLANT
TAPE CLOTH SURG 4X10 WHT LF (GAUZE/BANDAGES/DRESSINGS) ×1 IMPLANT
TOWEL OR 17X24 6PK STRL BLUE (TOWEL DISPOSABLE) ×2 IMPLANT
TOWEL OR 17X26 10 PK STRL BLUE (TOWEL DISPOSABLE) ×4 IMPLANT
TRAY FOLEY IC TEMP SENS 14FR (CATHETERS) ×2 IMPLANT
TUBE SUCT INTRACARD DLP 20F (MISCELLANEOUS) ×2 IMPLANT
UNDERPAD 30X30 INCONTINENT (UNDERPADS AND DIAPERS) ×2 IMPLANT
VALVE AORTIC MAGNA 21MM (Prosthesis & Implant Heart) ×1 IMPLANT
VENT LEFT HEART 12002 (CATHETERS) ×2
WATER STERILE IRR 1000ML POUR (IV SOLUTION) ×4 IMPLANT

## 2011-07-05 NOTE — Transfer of Care (Signed)
Immediate Anesthesia Transfer of Care Note  Patient: Terri Edwards  Procedure(s) Performed: Procedure(s) (LRB): AORTIC VALVE REPLACEMENT (AVR) (N/A)  Patient Location: SICU  Anesthesia Type: General  Level of Consciousness: Patient remains intubated per anesthesia plan  Airway & Oxygen Therapy: Patient remains intubated per anesthesia plan and Patient placed on Ventilator (see vital sign flow sheet for setting)  Post-op Assessment: Report given to SICU RN and Post -op Vital signs reviewed and stable  Post vital signs: Reviewed and stable  Complications: No apparent anesthesia complications

## 2011-07-05 NOTE — H&P (Signed)
CARDIOTHORACIC SURGERY HISTORY AND PHYSICAL EXAM   Referring Provider is Crenshaw, Madolyn Frieze, MD PCP is Carylon Perches, MD, MD    Chief Complaint   Patient presents with   .  Aortic Stenosis       Referral from Dr Jens Som for eval on poss.AVR, cardiac cath 06/27/11   .  Shortness of Breath   .  Dizziness     HPI:  Patient is a 61 year old female who was first noted to have a heart murmur on routine physical exam several years ago. Echocardiogram revealed aortic stenosis and she has been followed by Dr. Jens Som.  Over the past 1-1-1/2 years the patient has developed progressive fatigue and exertional shortness of breath. She now get short of breath with relatively mild physical activity. She has occasional atypical chest tightness that does not seem to be related to physical activity. She has had occasional dizzy spells without syncope. She denies resting shortness of breath, PND, orthopnea, or lower extremity edema. She has frequent palpitations. She recently underwent a followup echocardiogram demonstrating progression of the severity of aortic stenosis with peak velocity across the aortic valve measured 453 cm/s and peak and mean transvalvular gradients estimated 82 and 50 mm mercury respectively. Left ventricular systolic function remains normal. Cardiac catheterization was performed demonstrating normal coronary artery anatomy with no significant coronary artery disease. The patient has been referred for possible elective aortic valve replacement.    Past Medical History   Diagnosis  Date   .  HTN (hypertension), benign     .  Aortic stenosis     .  Hypercholesteremia     .  Bell's palsy  2005       Past Surgical History   Procedure  Date   .  Knee arthroplasty         left       Family History   Problem  Relation  Age of Onset   .  Stroke  Father     .  Heart attack  Mother       Social History History   Substance Use Topics   .  Smoking status:  Current Everyday  Smoker -- 0.5 packs/day       Types:  Cigarettes   .  Smokeless tobacco:  Never Used   .  Alcohol Use:  No       Current Outpatient Prescriptions   Medication  Sig  Dispense  Refill   .  amLODipine (NORVASC) 5 MG tablet  Take 1 tablet (5 mg total) by mouth daily.   30 tablet   12   .  aspirin 81 MG tablet  Take 81 mg by mouth daily.           .  metoprolol succinate (TOPROL-XL) 50 MG 24 hr tablet  Take 1 tablet (50 mg total) by mouth daily.   30 tablet   11   .  pantoprazole (PROTONIX) 40 MG tablet  Take 40 mg by mouth as needed.           .  pravastatin (PRAVACHOL) 40 MG tablet  Take 1 tablet (40 mg total) by mouth daily.   30 tablet   12       Allergies   Allergen  Reactions   .  Nickel     .  Sulfa Antibiotics       Review of Systems:  General:                      normal appetite, decreased energy and activity, 15 lb weight gain over 1.5 years             Respiratory:                no cough, no wheezing, no hemoptysis, no pain with inspiration or cough, + exertional shortness of breath             Cardiac:                      Occasional brief chest pain or tightness, + exertional SOB, no resting SOB, no PND, no orthopnea, no LE edema, + palpitations, no syncope             GI:                                no difficulty swallowing, no hematochezia, no hematemesis, no melena, no constipation, no diarrhea, reflux well controlled on medical therapy             GU:                              no dysuria, no urgency, no frequency               Musculoskeletal:         no arthritis, + mild arthralgia left knee             Vascular:                     no pain suggestive of claudication               Neuro:                         no symptoms suggestive of TIA's, no seizures, no headaches, no peripheral neuropathy               Endocrine:                   Negative               HEENT:                       no loose teeth or painful teeth,  no recent vision changes              Psych:                         no anxiety, no depression                Physical Exam:              BP 124/68  Pulse 74  Resp 16  Ht 5\' 2"  (1.575 m)  Wt 186 lb (84.369 kg)  BMI 34.02 kg/m2  SpO2 96%  LMP 04/29/1991             General:                      Obese  well-appearing  HEENT:                       Unremarkable               Neck:                           no JVD, no bruits, no adenopathy               Chest:                         clear to auscultation, symmetrical breath sounds, no wheezes, no rhonchi               CV:                              RRR, grade III/VI systolic murmur               Abdomen:                    soft, non-tender, no masses               Extremities:                 warm, well-perfused, pulses diminished but palpable, no LE edema             Rectal/GU                   Deferred             Neuro:                         Grossly non-focal and symmetrical throughout             Skin:                            Clean and dry, no rashes, no breakdown   Diagnostic Tests:  ------------------------------------------------------------ Transthoracic Echocardiography  Patient: Terri Edwards, Terri Edwards MR #: [id number] Study Date: 03/27/2011  LV EF: 55% - 60% ------------------------------------------------------------ ----------------------------------------------------------- ------------------------------------------------------------ Study Conclusions  - Left ventricle: The cavity size was normal. Wall thickness was increased in a pattern of mild LVH. Systolic function was normal. The estimated ejection fraction was in the range of 55% to 60%. Wall motion was normal; there were no regional wall motion abnormalities. Doppler parameters are consistent with abnormal left ventricular relaxation (grade 1 diastolic dysfunction). - Aortic valve: Valve mobility was restricted. There was severe stenosis. Mean gradient: 50mm Hg (S).  Peak gradient: 82mm Hg (S). - Left atrium: The atrium was mildly dilated. ------------------------------------------------------------ Left ventricle: The cavity size was normal. Wall thickness was increased in a pattern of mild LVH. Systolic function was normal. The estimated ejection fraction was in the range of 55% to 60%. Wall motion was normal; there were no regional wall motion abnormalities. Doppler parameters are consistent with abnormal left ventricular relaxation (grade 1 diastolic dysfunction). ------------------------------------------------------------ Aortic valve: Trileaflet; moderately thickened leaflets. Valve mobility was restricted. Doppler: There was severe stenosis. No regurgitation. Mean gradient: 50mm Hg (S). Peak gradient: 82mm Hg (S). ------------------------------------------------------------ Aorta: Aortic root: The aortic root was normal in size. ------------------------------------------------------------ Mitral valve: Structurally normal valve. Mobility was not restricted. Doppler: Transvalvular velocity was within the  normal range. There was no evidence for stenosis. Trivial regurgitation. Peak gradient: 3mm Hg (D). ------------------------------------------------------------ Left atrium: The atrium was mildly dilated. ------------------------------------------------------------ Right ventricle: The cavity size was normal. Systolic function was normal. ------------------------------------------------------------ Pulmonic valve: Doppler: Transvalvular velocity was within the normal range. There was no evidence for stenosis. Trivial regurgitation. ------------------------------------------------------------ Tricuspid valve: Structurally normal valve. Doppler: Transvalvular velocity was within the normal range. Trivial regurgitation. ------------------------------------------------------------ Pulmonary artery: Systolic pressure was within the  normal range. ------------------------------------------------------------ Right atrium: The atrium was normal in size. ------------------------------------------------------------ Pericardium: There was no pericardial effusion. ------------------------------------------------------------ Systemic veins: Inferior vena cava: The vessel was normal in size. ------------------------------------------------------------ ------------------------------------------------------------ Prepared and Electronically Authenticated by  Olga Millers, MD, [facility] 2013-01-07T10:29:00.097  Cardiac Catheterization Operative Report   Terri Edwards   [id number]   4/9/20139:17 AM      Hemodynamic Findings:   Ao: 140/69  LV: 177/14/24   RA: 8  RV: 34/6/12  PA: 33/13 (mean 22)   PCWP: 13  Fick Cardiac Output:4.9 L/min   Fick Cardiac Index: 2.7 L/min/m2   Central Aortic Saturation: 92%   Pulmonary Artery Saturation: 67%   Aortic valve: Mean gradient . AVA 0.90  Angiographic Findings:   Left main: No obstructive disease.   Left Anterior Descending Artery: Large vessel that courses to the apex and gives off a moderate sized Diagonal branch. No evidence of obstructive disease.   Circumflex Artery: Moderate sized vessel with early intermediate branch and a moderate sized OM branch. NO evidence of obstructive disease.   Right Coronary Artery: Moderate sized, dominant vessel with no evidence of obstructive disease.   Left Ventricular Angiogram: LVEF 60%.   Distal aortogram: No evidence of disease.   Operator: Verne Carrow, MD   Impression:  Severe symptomatic aortic stenosis with normal left ventricular systolic function and no significant coronary artery disease. The patient's only significant comorbid medical problems include hypertension and significant obesity.   Plan:  The patient was counseled at length regarding surgical alternatives with respect to valve replacement. In  particular, discussion was held comparing in contrast and the risks of mechanical valve replacement and the need for lifelong anticoagulation versus use of a bioprosthetic tissue valve and the associated potential for late structural valve deterioration in failure.  This discussion was placed in the context of the patient's particular circumstances, and as a result the patient specifically requests that their valve be replaced using a bioprosthetic tissue valve .  They understand and accept all potential associated risks of surgery including but not limited to risk of death, stroke, myocardial infarction, congestive heart failure, respiratory failure, renal failure, pneumonia, bleeding requiring blood transfusion and or reexploration, arrhythmia, heart block or bradycardia requiring permanent pacemaker, pleural effusions or other delayed complications related to continued congestive heart failure, or other late complications related to valve replacement.  Alternative surgical approaches have been discussed, including a comparison between conventional sternotomy and minimally-invasive techniques.  The relative risks and benefits of each have been reviewed as they pertain to the patient's specific circumstances.  After discussing the alternatives, the patient specifically requests that her valve be replaced using conventional full median sternotomy. All of her questions been addressed. We plan to proceed with surgery on Wednesday, April 17.    Kiefer Opheim H 07/05/2011 7:51 AM

## 2011-07-05 NOTE — Progress Notes (Signed)
   CARE MANAGEMENT NOTE 07/05/2011  Patient:  Terri Edwards, Terri Edwards   Account Number:  [id number]  Date Initiated:  07/05/2011  Documentation initiated by:  Tylea Hise  Subjective/Objective Assessment:   PT S/P AVR ON 07/05/11.  PTA, PT INDEPENDENT, LIVES WITH SPOUSE.     Action/Plan:   PT JUST OUT OF SURGERY, STILL INTUBATED.  WILL FOLLOW UP WHEN EXTUBATED.   Anticipated DC Date:  07/10/2011   Anticipated DC Plan:  HOME W HOME HEALTH SERVICES      DC Planning Services  CM consult      Choice offered to / List presented to:             Status of service:  In process, will continue to follow Medicare Important Message given?   (If response is "NO", the following Medicare IM given date fields will be blank) Date Medicare IM given:   Date Additional Medicare IM given:    Discharge Disposition:    Per UR Regulation:    If discussed at Long Length of Stay Meetings, dates discussed:    Comments:    Jerrell Belfast, RN, BSN Phone #[phone]

## 2011-07-05 NOTE — Procedures (Signed)
Extubation Procedure Note  Patient Details:   Name: LANETA GUERIN DOB: [birth year] MRN: [id number]   Airway Documentation:     Evaluation  O2 sats: stable throughout Complications: No apparent complications Patient did tolerate procedure well. Bilateral Breath Sounds: Clear   Yes  NIF -20, VC 600 ml.  Cuff leak positive.  Extubated per protocol.  Pt tolerated well.  No complications noted.  Lysbeth Penner [facility] 07/05/2011, 6:31 PM

## 2011-07-05 NOTE — Op Note (Signed)
CARDIOTHORACIC SURGERY OPERATIVE NOTE  Date of Procedure:  07/05/2011  Preoperative Diagnosis: Severe Aortic Stenosis   Postoperative Diagnosis: Same   Procedure:   Aortic Valve Replacement  [facility] Ease Pericardial Tissue Valve (size 21mm, model #3300TFX, serial [id number])   Surgeon: Salvatore Decent. Cornelius Moras, MD  Assistant: Gershon Crane, PA-C  Anesthesia: Bedelia Person, MD  Operative Findings:  Bicuspid aortic valve with severe aortic stenosis  Normal left ventricular systolic function  Moderate left ventricular hypertrophy   BRIEF CLINICAL NOTE AND INDICATIONS FOR SURGERY  Patient is a 61 year old female who was first noted to have a heart murmur on routine physical exam several years ago. Echocardiogram revealed aortic stenosis and she has been followed by Dr. Jens Som.  Over the past 1-1-1/2 years the patient has developed progressive fatigue and exertional shortness of breath. She now get short of breath with relatively mild physical activity. She has occasional atypical chest tightness that does not seem to be related to physical activity. She has had occasional dizzy spells without syncope. She denies resting shortness of breath, PND, orthopnea, or lower extremity edema. She has frequent palpitations. She recently underwent a followup echocardiogram demonstrating progression of the severity of aortic stenosis with peak velocity across the aortic valve measured 453 cm/s and peak and mean transvalvular gradients estimated 82 and 50 mm mercury respectively. Left ventricular systolic function remains normal. Cardiac catheterization was performed demonstrating normal coronary artery anatomy with no significant coronary artery disease. The patient has been referred for possible elective aortic valve replacement.  The patient has been seen in consultation and counseled at length regarding the indications, risks and potential benefits of surgery.  All questions have been answered, and the patient  provides full informed consent for the operation as described.     DETAILS OF THE OPERATIVE PROCEDURE  The patient is brought to the operating room on the above mentioned date and central monitoring was established by the anesthesia team including placement of Swan-Ganz catheter and radial arterial line. The patient is placed in the supine position on the operating table.  Intravenous antibiotics are administered. General endotracheal anesthesia is induced uneventfully. A Foley catheter is placed.  Baseline transesophageal echocardiogram was performed.  Findings were notable for severe aortic stenosis with normal LV systolic function and moderate LVH.  The patient's chest, abdomen, both groins, and both lower extremities are prepared and draped in a sterile manner. A time out procedure is performed.  A median sternotomy incision was performed and the pericardium is opened. The ascending aorta is small in size and very thin walled in appearance. The ascending aorta and the right atrium are cannulated for cardioplegia bypass.  Adequate heparinization is verified.   A retrograde cardioplegia cannula is placed through the right atrium into the coronary sinus.   The entire pre-bypass portion of the operation was notable for stable hemodynamics.  Cardiopulmonary bypass was begun and the surface of the heart is inspected.  A cardioplegia cannula is placed in the ascending aorta.  A temperature probe was placed in the interventricular septum.  The patient is allowed to cool passively to [facility] systemic temperature.  The aortic cross clamp is applied and cold blood cardioplegia is delivered initially in an antegrade fashion through the aortic root.  Supplemental cardioplegia is given retrograde through the coronary sinus catheter.  Iced saline slush is applied for topical hypothermia.  The initial cardioplegic arrest is rapid with early diastolic arrest.  Repeat doses of cardioplegia are administered  intermittently throughout the entire cross  clamp portion of the operation through the aortic root and through the coronary sinus catheter in order to maintain completely flat electrocardiogram and septal myocardial temperature below 15C.  Myocardial protection was felt to be excellent.  An oblique transverse aortotomy incision was performed.  The aortic valve was inspected and notable for congenitally bicuspid valve with fusion of the raphe between the left and right cusps.  There is severe aortic stenosis.  The aortic valve leaflets were excised sharply and the aortic annulus decalcified.  Decalcification was notably straightforward.  The aortic annulus was sized to accept a 21 mm prosthesis.  The aortic root and left ventricle were irrigated with copious cold saline solution.  Aortic valve replacement was performed using interrupted horizontal mattress 2-0 Ethibond pledgeted sutures with pledgets in the subannular position.  An [facility] Ease pericardial tissue valve (size 21 mm, model # 3300TFX, serial # [id number]) was implanted uneventfully. The valve seated appropriately with adequate space beneath the left main and right coronary artery.  The aortotomy was closed using a 2-layer closure of running 4-0 Prolene suture with Teflon felt strips to buttress the suture line.  One final dose of warm retrograde "hot shot" cardioplegia was administered retrograde through the coronary sinus catheter while all air was evacuated through the aortic root.  The aortic cross clamp was removed after a total cross clamp time of 64 minutes.  Epicardial pacing wires are fixed to the right ventricular outflow tract and to the right atrial appendage. The patient is rewarmed to 37C temperature. The aortic and left ventricular vents are removed.  There was bleeding from the RV outflow tract due to injury of the delicate tissues by retracton which required several pledgeted repair sutures.  The patient is weaned and  disconnected from cardiopulmonary bypass.  The patient's rhythm at separation from bypass was AV paced.  The patient was weaned from cardioplegic bypass without any inotropic support. Total cardiopulmonary bypass time for the operation was 112 minutes.  Followup transesophageal echocardiogram performed after separation from bypass revealed a well-seated aortic valve prosthesis that was functioning normally and without any sign of perivalvular leak.  Left ventricular function was unchanged from preoperatively.  The aortic and venous cannula were removed uneventfully. Protamine was administered to reverse the anticoagulation. The mediastinum and pleural space were inspected for hemostasis and irrigated with saline solution. The mediastinum was drained using 2 chest tubes placed through separate stab incisions inferiorly.  The soft tissues anterior to the aorta were reapproximated loosely. The sternum is closed with double strength sternal wire. The soft tissues anterior to the sternum were closed in multiple layers and the skin is closed with a running subcuticular skin closure.   The post-bypass portion of the operation was notable for stable rhythm and hemodynamics.   No blood products were administered during the operation.  The patient tolerated the procedure well and is transported to the surgical intensive care in stable condition. There are no intraoperative complications. All sponge instrument and needle counts are verified correct at completion of the operation.    Salvatore Decent. Cornelius Moras MD 07/05/2011 1:27 PM

## 2011-07-05 NOTE — Progress Notes (Signed)
*  PRELIMINARY RESULTS* Echocardiogram 2D Echocardiogram has been performed.  Katheren Puller 07/05/2011, 9:46 AM

## 2011-07-05 NOTE — Anesthesia Preprocedure Evaluation (Addendum)
Anesthesia Evaluation  Patient identified by MRN, date of birth, ID band Patient awake    Reviewed: Allergy & Precautions, H&P , NPO status , Patient's Chart, lab work & pertinent test results  History of Anesthesia Complications Negative for: history of anesthetic complications  Airway Mallampati: II TM Distance: >3 FB Neck ROM: Full    Dental   Pulmonary shortness of breath and with exertion, COPDCurrent Smoker,  + rhonchi   + decreased breath sounds      Cardiovascular hypertension, Pt. on home beta blockers + Valvular Problems/Murmurs AS + Systolic murmurs    Neuro/Psych negative neurological ROS  negative psych ROS   GI/Hepatic Neg liver ROS, GERD-  Medicated and Controlled,  Endo/Other  negative endocrine ROS  Renal/GU negative Renal ROS  negative genitourinary   Musculoskeletal negative musculoskeletal ROS (+)   Abdominal (+) + obese,   Peds  Hematology negative hematology ROS (+)   Anesthesia Other Findings   Reproductive/Obstetrics                          Anesthesia Physical Anesthesia Plan  ASA: III  Anesthesia Plan: General   Post-op Pain Management:    Induction: Intravenous  Airway Management Planned: Oral ETT  Additional Equipment: PA Cath, Arterial line, 3D TEE, CVP, TEE and Ultrasound Guidance Line Placement  Intra-op Plan:   Post-operative Plan: Post-operative intubation/ventilation  Informed Consent: I have reviewed the patients History and Physical, chart, labs and discussed the procedure including the risks, benefits and alternatives for the proposed anesthesia with the patient or authorized representative who has indicated his/her understanding and acceptance.   Dental advisory given  Plan Discussed with: CRNA, Surgeon and Anesthesiologist  Anesthesia Plan Comments:        Anesthesia Quick Evaluation

## 2011-07-05 NOTE — Anesthesia Procedure Notes (Signed)
Procedure Name: Intubation Performed by: Melina Schools Pre-anesthesia Checklist: Patient identified, Emergency Drugs available, Suction available, Patient being monitored and Timeout performed Patient Re-evaluated:Patient Re-evaluated prior to inductionOxygen Delivery Method: Circle system utilized Preoxygenation: Pre-oxygenation with 100% oxygen Intubation Type: IV induction Ventilation: Mask ventilation without difficulty Grade View: Grade I Tube type: Oral Tube size: 8.0 mm Number of attempts: 1 Airway Equipment and Method: Video-laryngoscopy and Rigid stylet Placement Confirmation: ETT inserted through vocal cords under direct vision,  breath sounds checked- equal and bilateral,  positive ETCO2 and CO2 detector Secured at: 22 cm Tube secured with: Tape Dental Injury: Teeth and Oropharynx as per pre-operative assessment

## 2011-07-05 NOTE — Anesthesia Postprocedure Evaluation (Signed)
  Anesthesia Post-op Note  Patient: Terri Edwards  Procedure(s) Performed: Procedure(s) (LRB): AORTIC VALVE REPLACEMENT (AVR) (N/A)  Patient Location: PACU and SICU  Anesthesia Type: General  Level of Consciousness: Patient remains intubated per anesthesia plan  Airway and Oxygen Therapy: Patient remains intubated per anesthesia plan  Post-op Pain: none  Post-op Assessment: Post-op Vital signs reviewed, Patient's Cardiovascular Status Stable, Respiratory Function Stable, Patent Airway and Pain level controlled  Post-op Vital Signs: stable  Complications: No apparent anesthesia complications

## 2011-07-05 NOTE — Progress Notes (Signed)
Patient ID: CHRISTABELL LOSEKE, female   DOB: 03/09/1951, 61 y.o.   MRN: [id number]  Filed Vitals:   07/05/11 1900 07/05/11 1915 07/05/11 2000 07/05/11 2100  BP: 91/51  73/57 92/52  Pulse: 90 90 90 90  Temp: 98.6 F (37 C) 98.6 F (37 C) 98.4 F (36.9 C) 98.6 F (37 C)  TempSrc:   Core (Comment) Core (Comment)  Resp: 19 14 18 21   Height:      Weight:      SpO2: 99% 98% 98% 98%   CO= 4.6  Extubated and alert  Urine output good  CT output low  CBC    Component Value Date/Time   WBC 14.9* 07/05/2011 1930   RBC 3.31* 07/05/2011 1930   HGB 9.9* 07/05/2011 1930   HCT 29.8* 07/05/2011 1930   PLT 124* 07/05/2011 1930   MCV 90.0 07/05/2011 1930   MCH 29.9 07/05/2011 1930   MCHC 33.2 07/05/2011 1930   RDW 13.7 07/05/2011 1930   LYMPHSABS 3.0 06/26/2011 0903   MONOABS 0.6 06/26/2011 0903   EOSABS 0.1 06/26/2011 0903   BASOSABS 0.0 06/26/2011 0903    BMET    Component Value Date/Time   NA 143 07/05/2011 1358   K 3.9 07/05/2011 1358   CL 101 07/03/2011 1536   CO2 22 07/03/2011 1536   GLUCOSE 103* 07/05/2011 1358   BUN 11 07/03/2011 1536   CREATININE 0.59 07/05/2011 1930   CALCIUM 9.6 07/03/2011 1536   GFRNONAA >90 07/05/2011 1930   GFRAA >90 07/05/2011 1930    A/P:  Doing well. Continue routine postop course.

## 2011-07-05 NOTE — Brief Op Note (Addendum)
                   [address]            [name] [address]          [phone]    07/05/2011  11:34 AM  PATIENT:  Terri Edwards  61 y.o. female  PRE-OPERATIVE DIAGNOSIS:  AORTIC STENOSIS  POST-OPERATIVE DIAGNOSIS:  Aortic stenosis  PROCEDURE:  Procedure(s): AORTIC VALVE REPLACEMENT (AVR) #21 EDWARDS MAGNA-EASE PERICARDIAL TISSUE VALVE  SURGEON:  Surgeon(s): Purcell Nails, MD  PHYSICIAN ASSISTANT: WAYNE GOLD PA-C  ANESTHESIA:   general  PATIENT CONDITION:  ICU - intubated and hemodynamically stable.  PRE-OPERATIVE WEIGHT: 84kg   COMPLICATIONS: NO KNOWN   Marlene Pfluger H 07/05/2011 1:17 PM

## 2011-07-06 ENCOUNTER — Inpatient Hospital Stay (HOSPITAL_COMMUNITY): Payer: BC Managed Care – PPO

## 2011-07-06 ENCOUNTER — Encounter (HOSPITAL_COMMUNITY): Payer: Self-pay | Admitting: Thoracic Surgery (Cardiothoracic Vascular Surgery)

## 2011-07-06 DIAGNOSIS — E8770 Fluid overload, unspecified: Secondary | ICD-10-CM

## 2011-07-06 DIAGNOSIS — I359 Nonrheumatic aortic valve disorder, unspecified: Principal | ICD-10-CM

## 2011-07-06 DIAGNOSIS — E8779 Other fluid overload: Secondary | ICD-10-CM

## 2011-07-06 LAB — GLUCOSE, CAPILLARY
Glucose-Capillary: 100 mg/dL — ABNORMAL HIGH (ref 70–99)
Glucose-Capillary: 112 mg/dL — ABNORMAL HIGH (ref 70–99)
Glucose-Capillary: 115 mg/dL — ABNORMAL HIGH (ref 70–99)
Glucose-Capillary: 126 mg/dL — ABNORMAL HIGH (ref 70–99)
Glucose-Capillary: 126 mg/dL — ABNORMAL HIGH (ref 70–99)
Glucose-Capillary: 141 mg/dL — ABNORMAL HIGH (ref 70–99)
Glucose-Capillary: 73 mg/dL (ref 70–99)
Glucose-Capillary: 92 mg/dL (ref 70–99)

## 2011-07-06 LAB — BASIC METABOLIC PANEL
CO2: 24 mEq/L (ref 19–32)
Chloride: 108 mEq/L (ref 96–112)
Glucose, Bld: 106 mg/dL — ABNORMAL HIGH (ref 70–99)
Potassium: 4.2 mEq/L (ref 3.5–5.1)
Sodium: 140 mEq/L (ref 135–145)

## 2011-07-06 LAB — POCT I-STAT, CHEM 8
Calcium, Ion: 1.25 mmol/L (ref 1.12–1.32)
Glucose, Bld: 143 mg/dL — ABNORMAL HIGH (ref 70–99)
HCT: 27 % — ABNORMAL LOW (ref 36.0–46.0)
Hemoglobin: 9.2 g/dL — ABNORMAL LOW (ref 12.0–15.0)
Potassium: 4.3 mEq/L (ref 3.5–5.1)

## 2011-07-06 LAB — MAGNESIUM: Magnesium: 2.9 mg/dL — ABNORMAL HIGH (ref 1.5–2.5)

## 2011-07-06 LAB — CBC
HCT: 27.4 % — ABNORMAL LOW (ref 36.0–46.0)
Hemoglobin: 9 g/dL — ABNORMAL LOW (ref 12.0–15.0)
Platelets: 121 10*3/uL — ABNORMAL LOW (ref 150–400)
RBC: 3 MIL/uL — ABNORMAL LOW (ref 3.87–5.11)
RDW: 14.3 % (ref 11.5–15.5)
WBC: 17.8 10*3/uL — ABNORMAL HIGH (ref 4.0–10.5)

## 2011-07-06 LAB — CREATININE, SERUM
Creatinine, Ser: 0.92 mg/dL (ref 0.50–1.10)
GFR calc Af Amer: 76 mL/min — ABNORMAL LOW (ref >90)
GFR calc non Af Amer: 66 mL/min — ABNORMAL LOW (ref >90)

## 2011-07-06 MED ORDER — INSULIN ASPART 100 UNIT/ML ~~LOC~~ SOLN
0.0000 [IU] | SUBCUTANEOUS | Status: DC
  Administered 2011-07-06 – 2011-07-07 (×6): 2 [IU] via SUBCUTANEOUS

## 2011-07-06 MED FILL — Magnesium Sulfate Inj 50%: INTRAMUSCULAR | Qty: 10 | Status: AC

## 2011-07-06 MED FILL — Potassium Chloride Inj 2 mEq/ML: INTRAVENOUS | Qty: 40 | Status: AC

## 2011-07-06 MED FILL — Dexmedetomidine HCl IV Soln 200 MCG/2ML: INTRAVENOUS | Qty: 2 | Status: AC

## 2011-07-06 NOTE — Progress Notes (Signed)
Patient ID: Terri Edwards, female   DOB: 12-13-1950, 61 y.o.   MRN: [id number] Feeling pretty good   BP 101/53  Pulse 85  Temp(Src) 99.1 F (37.3 C) (Oral)  Resp 21  Ht 5\' 2"  (1.575 m)  Wt 194 lb 14.2 oz (88.4 kg)  BMI 35.65 kg/m2  SpO2 98%  LMP 04/29/1991   Intake/Output Summary (Last 24 hours) at 07/06/11 1729 Last data filed at 07/06/11 1600  Gross per 24 hour  Intake 1914.26 ml  Output   1820 ml  Net  94.26 ml   Stable POD #1

## 2011-07-06 NOTE — Progress Notes (Signed)
   CARDIOTHORACIC SURGERY PROGRESS NOTE   R1 Day Post-Op Procedure(s) (LRB): AORTIC VALVE REPLACEMENT (AVR) (N/A)  Subjective: Looks good.  Mild soreness in chest.  No SOB.  No nausea.  Objective: Vital signs: BP Readings from Last 1 Encounters:  07/06/11 100/52   Pulse Readings from Last 1 Encounters:  07/06/11 89   Resp Readings from Last 1 Encounters:  07/06/11 22   Temp Readings from Last 1 Encounters:  07/06/11 100.1 F (37.8 C) Axillary    Hemodynamics: PAP: (24-50)/(16-29) 39/22 mmHg CO:  [3.1 L/min-4.7 L/min] 4.7 L/min CI:  [1.7 L/min/m2-2.5 L/min/m2] 2.5 L/min/m2  Physical Exam:  Rhythm:   sinus  Breath sounds: clear  Heart sounds:  RRR  Incisions:  Dressings dry  Abdomen:  Soft, non distended, non tender  Extremities:  Warm, well perfused   Intake/Output from previous day: 04/17 0701 - 04/18 0700 In: 4932.8 [I.V.:2618; Blood:550; IV Piggyback:1764.8] Out: 3460 [Urine:2850; Chest Tube:610] Intake/Output this shift:    Lab Results:  Basename 07/06/11 0415 07/05/11 1936 07/05/11 1930  WBC 19.4* -- 14.9*  HGB 9.0* 9.5* --  HCT 27.4* 28.0* --  PLT 135* -- 124*   BMET:  Basename 07/06/11 0415 07/05/11 1936 07/03/11 1536  NA 140 142 --  K 4.2 4.4 --  CL 108 108 --  CO2 24 -- 22  GLUCOSE 106* 123* --  BUN 12 9 --  CREATININE 0.68 0.60 --  CALCIUM 8.2* -- 9.6    CBG (last 3)   Basename 07/06/11 0039 07/05/11 2335 07/05/11 2226  GLUCAP 100* 130* 157*   ABG    Component Value Date/Time   PHART 7.300* 07/05/2011 1932   HCO3 23.9 07/05/2011 1932   TCO2 24 07/05/2011 1936   ACIDBASEDEF 3.0* 07/05/2011 1932   O2SAT 98.0 07/05/2011 1932   CXR: clear  Assessment/Plan: S/P Procedure(s) (LRB): AORTIC VALVE REPLACEMENT (AVR) (N/A)  Doing well POD1 Mild vasodilatation on low dose Neo drip Expected post op acute blood loss anemia, mild, stable Expected post op volume excess, mild Serous drainage from chest tubes   Mobilize  D/C  lines  Hold diuretics until BP stable off Neo  Watch anemia  Leave tubes in until output decreased  Nicolet Griffy H 07/06/2011 7:47 AM

## 2011-07-06 NOTE — Progress Notes (Signed)
@   Subjective:  Denies dyspnea; chest "sore".   Objective:  Filed Vitals:   07/06/11 0600 07/06/11 0615 07/06/11 0630 07/06/11 0645  BP: 94/48 100/54 105/56 105/56  Pulse: 90 90 90 89  Temp: 100 F (37.8 C) 100 F (37.8 C) 100 F (37.8 C) 100 F (37.8 C)  TempSrc:      Resp: 18 20 22 21   Height:      Weight:      SpO2: 95% 97% 95% 96%    Intake/Output from previous day:  Intake/Output Summary (Last 24 hours) at 07/06/11 0710 Last data filed at 07/06/11 0600  Gross per 24 hour  Intake 4814.64 ml  Output   3335 ml  Net 1479.64 ml    Physical Exam: Physical exam: Well-developed well-nourished in no acute distress.  Skin is warm and dry.  HEENT is normal.  Neck is supple.  Chest is clear to auscultation with normal expansion anteriorly Cardiovascular exam is regular rate and rhythm. 2/6 systolic murmur; no DM Abdominal exam nontender or distended. No masses palpated. Extremities show trace edema. neuro grossly intact    Lab Results: Basic Metabolic Panel:  Basename 07/06/11 0415 07/05/11 1936 07/05/11 1930 07/03/11 1536  NA 140 142 -- --  K 4.2 4.4 -- --  CL 108 108 -- --  CO2 24 -- -- 22  GLUCOSE 106* 123* -- --  BUN 12 9 -- --  CREATININE 0.68 0.60 -- --  CALCIUM 8.2* -- -- 9.6  MG 2.9* -- 3.5* --  PHOS -- -- -- --   CBC:  Basename 07/06/11 0415 07/05/11 1936 07/05/11 1930  WBC 19.4* -- 14.9*  NEUTROABS -- -- --  HGB 9.0* 9.5* --  HCT 27.4* 28.0* --  MCV 91.3 -- 90.0  PLT 135* -- 124*     Assessment/Plan:  1) S/P AVR for severe AS/bicuspid aortic valve. Doing well postop. Diurese for volume excess; wean pressors. Will arrange outpatient echo for baseline study following DC. 2) hypertension - resume preadmission meds at DC pending FU pressures 3) Hyperlipidemia - continue statin 4) Acute blood loss anemia - follow Hgb   Olga Millers 07/06/2011, 7:10 AM

## 2011-07-06 NOTE — Op Note (Signed)
NAMEANTONINETTE, Terri Edwards            ACCOUNT NO.:  [id number]  MEDICAL RECORD NO.:  [id number]  LOCATION:  2316                         FACILITY:  [facility]  PHYSICIAN:  Bedelia Person, M.D.        DATE OF BIRTH:  03-22-1950  DATE OF PROCEDURE:  07/05/2011 DATE OF DISCHARGE:                              OPERATIVE REPORT   TRANSESOPHAGEAL ECHOCARDIOGRAPHY REPORT  [name] is a patient with know aortic stenosis who is scheduled at this time for aortic valve replacement.  TEE will be used intraoperatively to assess the valvular replacement as well as cardiac function pre and post bypass.  The patient has no history of esophageal or gastric pathology.  After induction with general anesthesia, the airway was with an oral endotracheal tube.  An orogastric tube was placed to drain gastric contents and then removed.  The transesophageal transducer was heavily lubricated and placed down the oropharynx without resistance.  There was no evidence of oral or pharyngeal damage on the removal of the transducer at the completion of the procedure.  The prebypass examination revealed the left ventricle to be moderately thickened.  Contractility was good with no segmental defects.  Left atrium was slightly enlarged and her atrial septum was intact on color Doppler.  Left atrial appendage was clean.  The mitral valve apparatus and function appeared to be  normal.  There were no masses or vegetations noted. Color Doppler revealed no insufficiency.  The aortic valve was measured at 2.1 cm in diameter.  Valve had 3 leaflets.  All leaflets were heavily calcified and grossly restricted in their motion . There was no aortic insufficiency on color Doppler.  The tricuspid valve appeared normal .  Leaftlets open and closing appropriately.  Swan-Ganz catheter was noted across the valve.  Color Doppler did reveal trace insufficiency.  The aorta was normal size and appearance.  The patient was placed on  cardiopulmonary bypass and at the completion of an aortic valve replacement, numerous air bubbles were vented with a left ventricular vent.  The postbypass examination, no ionotropic support, revealed a left ventricle to continue to show good contractility with no segmental defects.  Mitral valve again was normal in appearance and function with color Doppler revealing no mitral insufficiency.  The prosthetic aortic valve appeared to be well seated.  Leaflets could be noted opening and closing appropriately.  Color Doppler reveal no perivalvular leak.  The tricuspid vale and right heart examination were again entirely normal.  There were no new findings other than those listed above.          ______________________________ Bedelia Person, M.D.     LK/MEDQ  D:  07/05/2011  T:  07/06/2011  Job:  [id number]

## 2011-07-07 ENCOUNTER — Inpatient Hospital Stay (HOSPITAL_COMMUNITY): Payer: BC Managed Care – PPO

## 2011-07-07 LAB — CBC
Platelets: 128 10*3/uL — ABNORMAL LOW (ref 150–400)
RBC: 2.83 MIL/uL — ABNORMAL LOW (ref 3.87–5.11)
RDW: 14.2 % (ref 11.5–15.5)
WBC: 15.4 10*3/uL — ABNORMAL HIGH (ref 4.0–10.5)

## 2011-07-07 LAB — BASIC METABOLIC PANEL
Calcium: 8.7 mg/dL (ref 8.4–10.5)
Creatinine, Ser: 0.82 mg/dL (ref 0.50–1.10)
GFR calc Af Amer: 88 mL/min — ABNORMAL LOW (ref >90)
Sodium: 137 mEq/L (ref 135–145)

## 2011-07-07 LAB — GLUCOSE, CAPILLARY
Glucose-Capillary: 128 mg/dL — ABNORMAL HIGH (ref 70–99)
Glucose-Capillary: 129 mg/dL — ABNORMAL HIGH (ref 70–99)

## 2011-07-07 MED ORDER — SODIUM CHLORIDE 0.9 % IJ SOLN
3.0000 mL | Freq: Two times a day (BID) | INTRAMUSCULAR | Status: DC
  Administered 2011-07-07 – 2011-07-10 (×7): 3 mL via INTRAVENOUS

## 2011-07-07 MED ORDER — SODIUM CHLORIDE 0.9 % IJ SOLN: 3.0000 mL | INTRAMUSCULAR | Status: DC | PRN

## 2011-07-07 MED ORDER — SODIUM CHLORIDE 0.9 % IV SOLN: 250.0000 mL | INTRAVENOUS | Status: DC | PRN

## 2011-07-07 MED ORDER — FUROSEMIDE 10 MG/ML IJ SOLN
20.0000 mg | Freq: Two times a day (BID) | INTRAMUSCULAR | Status: AC
  Administered 2011-07-07 (×2): 20 mg via INTRAVENOUS
  Filled 2011-07-07 (×2): qty 2

## 2011-07-07 MED ORDER — ACETAMINOPHEN 160 MG/5ML PO SOLN
975.0000 mg | Freq: Four times a day (QID) | ORAL | Status: DC
  Administered 2011-07-07 – 2011-07-08 (×2): 975 mg via ORAL
  Filled 2011-07-07 (×2): qty 40.6

## 2011-07-07 MED ORDER — DOCUSATE SODIUM 100 MG PO CAPS
200.0000 mg | ORAL_CAPSULE | Freq: Every day | ORAL | Status: DC
  Administered 2011-07-07 – 2011-07-09 (×3): 200 mg via ORAL
  Filled 2011-07-07 (×5): qty 2

## 2011-07-07 MED ORDER — TRAMADOL HCL 50 MG PO TABS
50.0000 mg | ORAL_TABLET | ORAL | Status: DC | PRN
  Administered 2011-07-07 – 2011-07-10 (×4): 50 mg via ORAL
  Filled 2011-07-07 (×4): qty 1

## 2011-07-07 MED ORDER — OXYCODONE HCL 5 MG PO TABS
5.0000 mg | ORAL_TABLET | ORAL | Status: DC | PRN
  Administered 2011-07-08: 5 mg via ORAL
  Filled 2011-07-07: qty 1

## 2011-07-07 MED ORDER — MORPHINE SULFATE 4 MG/ML IJ SOLN
INTRAMUSCULAR | Status: AC
  Administered 2011-07-07: 2 mg via INTRAVENOUS
  Filled 2011-07-07: qty 1

## 2011-07-07 MED ORDER — MENTHOL 3 MG MT LOZG
1.0000 | LOZENGE | OROMUCOSAL | Status: DC | PRN
  Filled 2011-07-07: qty 9

## 2011-07-07 MED ORDER — MOVING RIGHT ALONG BOOK
Freq: Once | Status: AC
  Administered 2011-07-07: 09:00:00
  Filled 2011-07-07: qty 1

## 2011-07-07 MED ORDER — MIDAZOLAM HCL 2 MG/2ML IJ SOLN
2.0000 mg | Freq: Once | INTRAMUSCULAR | Status: AC
  Administered 2011-07-07: 2 mg via INTRAVENOUS
  Filled 2011-07-07: qty 2

## 2011-07-07 MED ORDER — ASPIRIN EC 325 MG PO TBEC
325.0000 mg | DELAYED_RELEASE_TABLET | Freq: Every day | ORAL | Status: DC
  Administered 2011-07-07 – 2011-07-10 (×4): 325 mg via ORAL
  Filled 2011-07-07 (×4): qty 1

## 2011-07-07 MED ORDER — FUROSEMIDE 40 MG PO TABS
40.0000 mg | ORAL_TABLET | Freq: Every day | ORAL | Status: DC
  Administered 2011-07-08 – 2011-07-10 (×3): 40 mg via ORAL
  Filled 2011-07-07 (×4): qty 1

## 2011-07-07 MED ORDER — POTASSIUM CHLORIDE CRYS ER 20 MEQ PO TBCR
20.0000 meq | EXTENDED_RELEASE_TABLET | Freq: Every day | ORAL | Status: DC
  Administered 2011-07-08 – 2011-07-10 (×3): 20 meq via ORAL
  Filled 2011-07-07 (×4): qty 1

## 2011-07-07 MED ORDER — INSULIN ASPART 100 UNIT/ML ~~LOC~~ SOLN: 0.0000 [IU] | Freq: Three times a day (TID) | SUBCUTANEOUS | Status: DC

## 2011-07-07 MED ORDER — METOPROLOL TARTRATE 12.5 MG HALF TABLET
12.5000 mg | ORAL_TABLET | Freq: Two times a day (BID) | ORAL | Status: DC
  Administered 2011-07-07 – 2011-07-10 (×7): 12.5 mg via ORAL
  Filled 2011-07-07 (×8): qty 1

## 2011-07-07 MED FILL — Sodium Chloride Irrigation Soln 0.9%: Qty: 3000 | Status: AC

## 2011-07-07 MED FILL — Heparin Sodium (Porcine) Inj 1000 Unit/ML: INTRAMUSCULAR | Qty: 20 | Status: AC

## 2011-07-07 MED FILL — Sodium Chloride IV Soln 0.9%: INTRAVENOUS | Qty: 1000 | Status: AC

## 2011-07-07 MED FILL — Heparin Sodium (Porcine) Inj 1000 Unit/ML: INTRAMUSCULAR | Qty: 30 | Status: AC

## 2011-07-07 MED FILL — Electrolyte-R (PH 7.4) Solution: INTRAVENOUS | Qty: 6000 | Status: AC

## 2011-07-07 MED FILL — Sodium Bicarbonate IV Soln 8.4%: INTRAVENOUS | Qty: 50 | Status: AC

## 2011-07-07 MED FILL — Mannitol IV Soln 20%: INTRAVENOUS | Qty: 500 | Status: AC

## 2011-07-07 MED FILL — Lidocaine HCl IV Inj 20 MG/ML: INTRAVENOUS | Qty: 5 | Status: AC

## 2011-07-07 NOTE — Progress Notes (Signed)
   CARE MANAGEMENT NOTE 07/07/2011  Patient:  BREELLA, VANOSTRAND   Account Number:  [id number]  Date Initiated:  07/05/2011  Documentation initiated by:  Nielle Duford  Subjective/Objective Assessment:   PT S/P AVR ON 07/05/11.  PTA, PT INDEPENDENT, LIVES WITH SPOUSE.     Action/Plan:   PT JUST OUT OF SURGERY, STILL INTUBATED.  WILL FOLLOW UP WHEN EXTUBATED.   Anticipated DC Date:  07/10/2011   Anticipated DC Plan:  HOME W HOME HEALTH SERVICES      DC Planning Services  CM consult      Choice offered to / List presented to:             Status of service:  In process, will continue to follow Medicare Important Message given?   (If response is "NO", the following Medicare IM given date fields will be blank) Date Medicare IM given:   Date Additional Medicare IM given:    Discharge Disposition:    Per UR Regulation:    If discussed at Long Length of Stay Meetings, dates discussed:    Comments:  07/07/11 Araeya Lamb,RN,BSN PTA, PT INDEPENDENT, LIVES WITH SPOUSE.  WILL FOLLOW FOR HOME NEEDS AS PT PROGRESSES. Phone #[phone]

## 2011-07-07 NOTE — Plan of Care (Signed)
Problem: Phase III Progression Outcomes Goal: Transfer to PCTU/Telemetry POD Outcome: Completed/Met Date Met:  07/07/11 Transferred to 2016 via wheelchair with O2 and propak.  Tolerated transfer well.  Parked and walked upon arrival. Goal: Time patient transferred to PCTU/Telemetry POD Outcome: Completed/Met Date Met:  07/07/11 Transferred @ 1430

## 2011-07-07 NOTE — Progress Notes (Signed)
   CARDIOTHORACIC SURGERY PROGRESS NOTE   R2 Days Post-Op Procedure(s) (LRB): AORTIC VALVE REPLACEMENT (AVR) (N/A)  Subjective: Feels sore in chest.  Otherwise doing well.  Objective: Vital signs: BP Readings from Last 1 Encounters:  07/07/11 115/52   Pulse Readings from Last 1 Encounters:  07/07/11 83   Resp Readings from Last 1 Encounters:  07/07/11 23   Temp Readings from Last 1 Encounters:  07/07/11 98.8 F (37.1 C) Oral    Hemodynamics: PAP: (41)/(20) 41/20 mmHg  Physical Exam:  Rhythm:   sinus  Breath sounds: clear  Heart sounds:  RRR  Incisions:  Clean and dry  Abdomen:  soft  Extremities:  warm   Intake/Output from previous day: 04/18 0701 - 04/19 0700 In: 635.5 [I.V.:533.5; IV Piggyback:102] Out: 1115 [Urine:825; Chest Tube:290] Intake/Output this shift:    Lab Results:  Basename 07/07/11 0439 07/06/11 1653  WBC 15.4* 17.8*  HGB 8.6* 9.1*  HCT 26.7* 27.4*  PLT 128* 121*   BMET:  Basename 07/07/11 0439 07/06/11 1653 07/06/11 1650 07/06/11 0415  NA 137 -- 140 --  K 4.2 -- 4.3 --  CL 104 -- 104 --  CO2 26 -- -- 24  GLUCOSE 114* -- 143* --  BUN 16 -- 17 --  CREATININE 0.82 0.92 -- --  CALCIUM 8.7 -- -- 8.2*    CBG (last 3)   Basename 07/07/11 0412 07/07/11 0029 07/06/11 2032  GLUCAP 129* 128* 141*   ABG    Component Value Date/Time   PHART 7.300* 07/05/2011 1932   HCO3 23.9 07/05/2011 1932   TCO2 23 07/06/2011 1650   ACIDBASEDEF 3.0* 07/05/2011 1932   O2SAT 98.0 07/05/2011 1932   CXR: clear  Assessment/Plan: S/P Procedure(s) (LRB): AORTIC VALVE REPLACEMENT (AVR) (N/A)  Doing well POD2 Expected post op acute blood loss anemia, mild, stable Expected post op volume excess, mild   Mobilize  Diuresis  D/C tubes  Transfer step down   Ashlyne Olenick H 07/07/2011 8:19 AM

## 2011-07-07 NOTE — Progress Notes (Signed)
CARDIAC REHAB PHASE I   PRE:  Rate/Rhythm: 94SR  BP:  Supine:   Sitting: 99/48  Standing:    SaO2: 91%2L  MODE:  Ambulation: 150 ft   POST:  Rate/Rhythem: 105ST  BP:  Supine:   Sitting: 109/50  Standing:    SaO2: 86%2L,89-90%3L 1430-1450 Pt just arrived from transfer. Willing to walk. Very sleepy. Walked 150 ft on 2L with rolling walker and asst x 2. Denied dizziness or SOB. Just very sleepy. Desat on 2L to 86%.  To bed after walk and put on 3L to keep sats 89-90%. Bed alarm on. Discussed with pt up with asst only.  Duanne Limerick

## 2011-07-08 ENCOUNTER — Inpatient Hospital Stay (HOSPITAL_COMMUNITY): Payer: BC Managed Care – PPO

## 2011-07-08 LAB — GLUCOSE, CAPILLARY
Glucose-Capillary: 116 mg/dL — ABNORMAL HIGH (ref 70–99)
Glucose-Capillary: 116 mg/dL — ABNORMAL HIGH (ref 70–99)

## 2011-07-08 LAB — CBC
HCT: 27.5 % — ABNORMAL LOW (ref 36.0–46.0)
Hemoglobin: 8.9 g/dL — ABNORMAL LOW (ref 12.0–15.0)
MCHC: 32.4 g/dL (ref 30.0–36.0)
MCV: 94.5 fL (ref 78.0–100.0)
RDW: 14.2 % (ref 11.5–15.5)
WBC: 12.4 10*3/uL — ABNORMAL HIGH (ref 4.0–10.5)

## 2011-07-08 LAB — BASIC METABOLIC PANEL
BUN: 19 mg/dL (ref 6–23)
Chloride: 101 mEq/L (ref 96–112)
Creatinine, Ser: 0.82 mg/dL (ref 0.50–1.10)
GFR calc Af Amer: 88 mL/min — ABNORMAL LOW (ref >90)
Glucose, Bld: 110 mg/dL — ABNORMAL HIGH (ref 70–99)

## 2011-07-08 MED ORDER — ASPIRIN 325 MG PO TBEC: 325.0000 mg | DELAYED_RELEASE_TABLET | Freq: Every day | ORAL | Status: AC

## 2011-07-08 MED ORDER — POTASSIUM CHLORIDE CRYS ER 20 MEQ PO TBCR: 20.0000 meq | EXTENDED_RELEASE_TABLET | Freq: Every day | ORAL | Status: DC

## 2011-07-08 MED ORDER — MENTHOL 3 MG MT LOZG: 1.0000 | LOZENGE | OROMUCOSAL | Status: DC | PRN

## 2011-07-08 MED ORDER — OXYCODONE HCL 5 MG PO TABS: 5.0000 mg | ORAL_TABLET | ORAL | Status: DC | PRN

## 2011-07-08 MED ORDER — METOPROLOL SUCCINATE ER 25 MG PO TB24: 25.0000 mg | ORAL_TABLET | Freq: Every day | ORAL | Status: DC

## 2011-07-08 MED ORDER — FUROSEMIDE 40 MG PO TABS: 40.0000 mg | ORAL_TABLET | Freq: Every day | ORAL | Status: DC

## 2011-07-08 MED ORDER — LACTULOSE 10 GM/15ML PO SOLN
20.0000 g | Freq: Once | ORAL | Status: DC
  Filled 2011-07-08: qty 30

## 2011-07-08 MED ORDER — GUAIFENESIN ER 600 MG PO TB12
600.0000 mg | ORAL_TABLET | Freq: Two times a day (BID) | ORAL | Status: DC
  Filled 2011-07-08 (×2): qty 1

## 2011-07-08 MED ORDER — GUAIFENESIN 100 MG/5ML PO SOLN
5.0000 mL | ORAL | Status: DC | PRN
  Administered 2011-07-10: 100 mg via ORAL
  Filled 2011-07-08 (×2): qty 5

## 2011-07-08 NOTE — Progress Notes (Addendum)
3 Days Post-Op Procedure(s) (LRB): AORTIC VALVE REPLACEMENT (AVR) (N/A)  Subjective: Patient with complaints of sore throat, cough, and incisional pain.  Objective: Vital signs in last 24 hours: Patient Vitals for the past 24 hrs:  BP Temp Temp src Pulse Resp SpO2 Weight  07/08/11 0500 108/51 mmHg 98.2 F (36.8 C) Oral 88  18  91 % 192 lb 14.4 oz (87.5 kg)  07/07/11 2100 107/50 mmHg 99.1 F (37.3 C) Oral 90  20  96 % -  07/07/11 1445 91/48 mmHg 99.8 F (37.7 C) Oral 98  20  91 % -  07/07/11 1400 106/53 mmHg - - 87  20  94 % -  07/07/11 1300 107/61 mmHg - - 100  20  97 % -  07/07/11 1238 - - - 93  24  96 % -  07/07/11 1200 108/55 mmHg - - 88  19  97 % -  07/07/11 1148 - 99.6 F (37.6 C) Oral - - - -  07/07/11 1100 115/50 mmHg - - 94  29  95 % -  07/07/11 1017 107/49 mmHg - - 88  - - -  07/07/11 1000 107/49 mmHg - - 87  18  96 % -  07/07/11 0900 115/49 mmHg - - 89  22  95 % -   Pre op weight  84 kg Current Weight  07/08/11 192 lb 14.4 oz (87.5 kg)      Intake/Output from previous day: 04/19 0701 - 04/20 0700 In: 3 [I.V.:3] Out: 782 [Urine:757; Chest Tube:25]   Physical Exam:  Cardiovascular: RRR, no murmurs, gallops, or rubs. Pulmonary: Diminished at bases bilaterally; no rales, wheezes, or rhonchi. Abdomen: Soft, non tender, bowel sounds present. Extremities: Mild bilateral lower extremity edema. Wounds: Clean and dry.  No erythema or signs of infection.  Lab Results: CBC: Basename 07/08/11 0620 07/07/11 0439  WBC 12.4* 15.4*  HGB 8.9* 8.6*  HCT 27.5* 26.7*  PLT 148* 128*   BMET:  Basename 07/08/11 0620 07/07/11 0439  NA 137 137  K 4.0 4.2  CL 101 104  CO2 30 26  GLUCOSE 110* 114*  BUN 19 16  CREATININE 0.82 0.82  CALCIUM 8.9 8.7    PT/INR:  Basename 07/05/11 1400  LABPROT 18.6*  INR 1.52*   ABG:  INR: Will add last result for INR, ABG once components are confirmed Will add last 4 CBG results once components are  confirmed  Assessment/Plan:  1. CV - SR. Continue Lopressor 12.5 bid. 2.  Pulmonary - Encourage incentive spirometer and flutter valve.CXR this am shows no ptx, bilateral pleural effusions L>R, bibasilar atelectasis. 3. Volume Overload - Continue with diuresis. 4.  Acute blood loss anemia - H/H this am 8.9/27.5. 5.Mild thrombocytopenia-Platelets this am 148,000. 6.Robitussin for cough.    ZIMMERMAN,DONIELLE MPA-C 07/08/2011   I have seen and examined the patient and agree with the assessment and plan as outlined.  Mrs Wolfert continues to progress.  Possibly ready for d/c home by Monday or Tuesday  [name],[name] 07/08/2011 12:40 PM

## 2011-07-08 NOTE — Progress Notes (Addendum)
CARDIAC REHAB PHASE I   PRE:  Rate/Rhythm: 87  BP:  Supine:   Sitting: 102/52  Standing:    SaO2: 93 3L  MODE:  Ambulation: 275 ft   POST:  Rate/Rhythem: 93  BP:  Supine:   Sitting: 102/51  Standing:    SaO2: 94 3L  1300 - 1345  Pt ambulated with RW and 2 assist steady. Rest break x 1. O2 SATS maintained on 3L 02. Back to chair with feet up and call bell in reach. Inc use of IS  Rosalie Doctor

## 2011-07-08 NOTE — Progress Notes (Signed)
Patient ambulated with rolling walker and 1 person assist. Rest break x2. O2 sats maintained on 3L of O2.  Back to bed with call bell in reach.  Lodema Pilot [facility]

## 2011-07-08 NOTE — Discharge Instructions (Signed)
Activity: 1.May walk up steps                2.No lifting more than ten pounds for four weeks.                 3.No driving for four weeks.                4.Stop any activity that causes chest pain, shortness of breath, dizziness,  sweating or excessive weakness.                5.Avoid straining.                6.Continue with your breathing exercises daily.  Diet:  Low fat, Low salt, low sugar diet  Wound Care: May shower.  Clean wounds with mild soap and water daily. Contact the office at [phone] if any problems arise.  Aortic Valve Replacement Care After Read the instructions outlined below and refer to this sheet for the next few weeks. These discharge instructions provide you with general information on caring for yourself after you leave the hospital. Your surgeon may also give you specific instructions. While your treatment has been planned according to the most current medical practices available, unavoidable complications occasionally occur. If you have any problems or questions after discharge, please call your surgeon. AFTER THE PROCEDURE  Full recovery from heart valve surgery can take several months.   Blood thinning (anticoagulation) treatment with warfarin is often prescribed for 6 weeks to 3 months after surgery for those with biological valves. It is prescribed for life for those with mechanical valves.   Recovery includes healing of the surgical incision. There is a gradual building of stamina and exercise abilities. An exercise program under the direction of a physical therapist may be recommended.   Once you have an artificial valve, your heart function and your life will return to normal. You usually feel better after surgery. Shortness of breath and fatigue should lessen. If your heart was already severely damaged before your surgery, you may continue to have problems.   You can usually resume most of your normal activities. You will have to continue to monitor your  condition. You need to watch out for blood clots and infections.   Artificial valves need to be replaced after a period of time. It is important that you see your caregiver regularly.   Some individuals with an aortic valve replacement need to take antibiotics before having dental work or other surgical procedures. This is called prophylactic antibiotic treatment. These drugs help to prevent infective endocarditis. Antibiotics are only recommended for individuals with the highest risk for developing infective endocarditis. Let your dentist and your caregiver know if you have a history of any of the following so that the necessary precautions can be taken:   A VSD.   A repaired VSD.   Endocarditis in the past.   An artificial (prosthetic) heart valve.  HOME CARE INSTRUCTIONS   Use all medications as prescribed.   Take your temperature every morning for the first week after surgery. Record these.   Weigh yourself every morning for at least the first week after surgery and record.   Do not lift more than 10 pounds (4.5 kg) until your breastbone (sternum) has healed. Avoid all activities which would place strain on your incision.   You may shower as soon as directed by your caregiver after surgery. Pat incisions dry. Do not rub incisions with washcloth or towel.  Avoid driving for 4 to 6 weeks following surgery or as instructed.   Use your elastic stockings during the day. You should wear the stockings for at least 2 weeks after discharge or longer if your ankles are swollen. The stockings help blood flow and help reduce swelling in the legs. It is easiest to put the stockings on before you get out of bed in the morning. They should fit snugly.  Pain Control  If a prescription was given for a pain reliever, please follow your doctor's directions.   If the pain is not relieved by your medicine, becomes worse, or you have difficulty breathing, call your surgeon.  Activity  Take frequent  rest periods throughout the day.   Wait one week before returning to strenuous activities such as heavy lifting (more than 10 pounds), pushing or pulling.   Talk with your doctor about when you may return to work and your exercise routine.   Do not drive while taking prescription pain medication.  Nutrition  You may resume your normal diet.   Drink plenty of fluids (6-8 glasses a day).   Eat a well-balanced diet.   Call your caregiver for persistent nausea or vomiting.  Elimination Your normal bowel function should return. If constipation should occur, you may:  Take a mild laxative.   Add fruit and bran to your diet.   Drink more fluids.   Call your doctor if constipation is not relieved.  SEEK IMMEDIATE MEDICAL CARE IF:   You develop chest pain which is not coming from your surgical cut (incision).   You develop shortness of breath or have difficulty breathing.   You develop a temperature over 101 F (38.3 C).   You have a sudden weight gain. Let your caregiver know what the weight gain is.   You develop a rash.   You develop any reaction or side effects to medications given.   You have increased bleeding from wounds.   You see redness, swelling, or have increasing pain in wounds.   You have pus coming from your wound.   You develop lightheadedness or feel faint.  Document Released: 09/22/2004 Document Revised: 02/23/2011 Document Reviewed: 12/14/2004 [facility] Patient Information 2012 [place], [place].

## 2011-07-09 LAB — GLUCOSE, CAPILLARY: Glucose-Capillary: 110 mg/dL — ABNORMAL HIGH (ref 70–99)

## 2011-07-09 NOTE — Discharge Summary (Signed)
I agree with the above discharge summary and plan for follow-up.  Terri Edwards H  

## 2011-07-09 NOTE — Progress Notes (Signed)
Patient shown Recovering after Heart Surgery video.

## 2011-07-09 NOTE — Progress Notes (Addendum)
Pt ambulated around circle and down hallway approximately 300 feet with her sister.  Started to walk without O2 as sats were 94 on room air after bathing.  Sats dropped to 89-90% on room air.  Replaced O2 at 2 liters [place].  Pt had no sob or distress.  Pt returned to room.  Resting in chair with sister in room.  Will continue to monitor.  Call bell within reach. Dereck Ligas [place] Pt walked an additional 300 feet in the afternoon for a total of two walks today.  Pt tolerated walk well without any sob or distress.  Reduced O2 to 1 liter [place]. Pt with call bell within reach.  Will continue to monitor. Thomas Hoff

## 2011-07-09 NOTE — Progress Notes (Signed)
Assessed patient's ambulation status.  Patient has ambulated three times today. Will continue to monitor.

## 2011-07-09 NOTE — Progress Notes (Addendum)
4 Days Post-Op Procedure(s) (LRB): AORTIC VALVE REPLACEMENT (AVR) (N/A)  Subjective: Patient with small bowel movement. Oxycodone makes her have nausea.  Objective: Vital signs in last 24 hours: Patient Vitals for the past 24 hrs:  BP Temp Temp src Pulse Resp SpO2 Weight  07/09/11 0633 106/66 mmHg 99.1 F (37.3 C) Oral 87  18  92 % -  07/09/11 0300 - - - - - - 190 lb 11.2 oz (86.5 kg)  07/08/11 2111 111/51 mmHg - - 88  - - -  07/08/11 2005 104/50 mmHg 98.6 F (37 C) Oral 85  19  95 % -  07/08/11 1700 92/62 mmHg - - - - - -  07/08/11 1416 88/56 mmHg 99.2 F (37.3 C) Oral 84  18  92 % -   Pre op weight  84 kg Current Weight  07/09/11 190 lb 11.2 oz (86.5 kg)      Intake/Output from previous day: 04/20 0701 - 04/21 0700 In: 360 [P.O.:360] Out: 4 [Urine:3; Stool:1]   Physical Exam:  Cardiovascular: RRR, no murmurs, gallops, or rubs. Pulmonary: Diminished at bases bilaterally; no rales, wheezes, or rhonchi. Abdomen: Soft, non tender, bowel sounds present. Extremities: Mild bilateral lower extremity edema. Wounds: Clean and dry.  No erythema or signs of infection.  Lab Results: CBC:  Basename 07/08/11 0620 07/07/11 0439  WBC 12.4* 15.4*  HGB 8.9* 8.6*  HCT 27.5* 26.7*  PLT 148* 128*   BMET:   Basename 07/08/11 0620 07/07/11 0439  NA 137 137  K 4.0 4.2  CL 101 104  CO2 30 26  GLUCOSE 110* 114*  BUN 19 16  CREATININE 0.82 0.82  CALCIUM 8.9 8.7    PT/INR: No results found for this basename: LABPROT,INR in the last 72 hours ABG:  INR: Will add last result for INR, ABG once components are confirmed Will add last 4 CBG results once components are confirmed  Assessment/Plan:  1. CV - SR. Continue Lopressor 12.5 bid. 2.  Pulmonary - Encourage incentive spirometer and flutter valve.Wean O2 as tolerates. 3. Volume Overload - Continue with diuresis. 4.  Acute blood loss anemia - H/H this am 8.9/27.5. 5.Mild thrombocytopenia-Last platelets  148,000. 6.Stop  oxycodone. 7.Remove EPW in am.       Ardelle Balls 07/09/2011 7:54 AM   I have seen and examined the patient and agree with the assessment and plan as outlined.  Tiahna Cure H 07/09/2011 11:45 AM

## 2011-07-09 NOTE — Discharge Summary (Signed)
Physician Discharge Summary  Patient ID: Terri Edwards MRN: [id number] DOB/AGE: 08/12/1950 61 y.o.  Admit date: 07/05/2011 Discharge date: 07/10/2011  Admission Diagnoses: 1. Severe aortic stenosis 2. History of benign hypertension 3. History of hypercholesterolemia 4. History of Bell's palsy 5. History of tobacco abuse  Discharge Diagnoses:  1. Severe aortic stenosis 2. History of benign hypertension 3. History of hypercholesterolemia 4. History of Bell's palsy 5. History of tobacco abuse 6.ABL anemia 7.Mild thrombocytopenia  Procedure (s):  Aortic Valve Replacement  Edwards Magna Ease Pericardial Tissue Valve (size 21mm, model #3300TFX, serial [id number]) by Dr. Cornelius Moras on 07/05/2011.  History of Presenting Illness: This is a 61 year old female who was first noted to have a heart murmur on routine physical exam several years ago. Echocardiogram revealed aortic stenosis and she has been followed by Dr. Jens Som. Over the past 1-1-1/2 years, the patient has developed progressive fatigue and exertional shortness of breath. She now gets short of breath with relatively mild physical activity. She has occasional atypical chest tightness that does not seem to be related to physical activity. She has had occasional dizzy spells without syncope. She denies resting shortness of breath, PND, orthopnea, or lower extremity edema. Sh ealso  has frequent palpitations. She recently underwent a followup echocardiogram that demonstarted progression of the severity of aortic stenosis, with peak velocity across the aortic valve measured 453 cm/s and peak, and mean transvalvular gradients estimated 82 and 50 mm mercury respectively. Left ventricular systolic function remains normal. Cardiac catheterization done on 06/27/2011 showed normal coronary artery anatomy with no significant coronary artery disease. The patient was seen in the office by Dr. Cornelius Moras for possible elective aortic valve replacement.    Brief  Hospital Course: She was extubated without difficulty early the evening of surgery. She remained afebrile and he hemodynamically stable. Her Swan-Ganz, A-line, chest tubes, and Foley were all removed early in her postoperative course. She was weaned off Neo-Synephrine. She was found to be volume overloaded and diuresed. She was found to have acute blood loss anemia. Her H&H went as low as 8.6 and 26.7. She did not require a postoperative transfusion. Her last H&H was 8.9 and 27.5. She also mild thrombocytopenia. Platelet count was low as 121,000; however her last white count was up to 148,000. She was felt surgically stable for transfer from the intensive care to [facility] U. for further convalescence on 4/19. She continued to progress with cardiac rehabilitation. She still is requiring 2-3 L of oxygen via nasal cannula as she does desat with ambulation. She has are to be tolerating a diet has had a bowel movement. Epicardial pacing wires and chest tube sutures will be removed prior to her discharge Provided she remains afebrile, hemodynamically stable, and pending morning round evaluation, she will be surgicalyl stable for discharge on 07/10/2011.  Latest Vital Signs: Blood pressure 104/52, pulse 92, temperature 99.1 F (37.3 C), temperature source Oral, resp. rate 18, height 5\' 2"  (1.575 m), weight 190 lb 11.2 oz (86.5 kg), last menstrual period 04/29/1991, SpO2 92.00%.  Physical Exam: Cardiovascular: RRR, no murmurs, gallops, or rubs.  Pulmonary: Diminished at bases bilaterally; no rales, wheezes, or rhonchi.  Abdomen: Soft, non tender, bowel sounds present.  Extremities: Mild bilateral lower extremity edema.  Wounds: Clean and dry. No erythema or signs of infection.  Discharge Condition:Stable  Recent laboratory studies:  Lab Results  Component Value Date   WBC 12.4* 07/08/2011   HGB 8.9* 07/08/2011   HCT 27.5* 07/08/2011   MCV 94.5 07/08/2011  PLT 148* 07/08/2011   Lab Results  Component Value  Date   NA 137 07/08/2011   K 4.0 07/08/2011   CL 101 07/08/2011   CO2 30 07/08/2011   CREATININE 0.82 07/08/2011   GLUCOSE 110* 07/08/2011      Diagnostic Studies: Dg Chest 2 View  07/08/2011  *RADIOLOGY REPORT*  Clinical Data: 61 year old female with heart surgery.  Weakness.  CHEST - 2 VIEW  Comparison: 07/07/2011 and earlier.  Findings: Mediastinal drain and right IJ introducer sheath removed. Epicardial pacer wires remain in place.  Sequelae median sternotomy and valve replacement.  Stable cardiac size and mediastinal contours.  Small moderate bilateral pleural effusions.  No pneumothorax or pulmonary edema.  IMPRESSION: 1.  Lines and tubes removed as above. 2.  Small to moderate bilateral pleural effusions.  Original Report Authenticated By: Harley Hallmark, M.D.   Discharge Orders    Future Appointments: Provider: Department: Dept Phone: Center:   09/25/2011 8:45 AM Lewayne Bunting, MD [facility] [phone] [facility]      Discharge Medications: Medication List  As of 07/09/2011 11:27 AM   STOP taking these medications         amLODipine 5 MG tablet      aspirin 81 MG chewable tablet      naproxen sodium 220 MG tablet         TAKE these medications         aspirin 325 MG EC tablet   Take 1 tablet (325 mg total) by mouth daily.      furosemide 40 MG tablet   Commonly known as: LASIX   Take 1 tablet (40 mg total) by mouth daily. For 5 days then stop.      menthol-cetylpyridinium 3 MG lozenge   Commonly known as: CEPACOL   Take 1 lozenge (3 mg total) by mouth as needed.      metoprolol succinate 25 MG 24 hr tablet   Commonly known as: TOPROL-XL   Take 1 tablet (25 mg total) by mouth daily.      oxyCODONE 5 MG immediate release tablet   Commonly known as: Oxy IR/ROXICODONE   Take 1-2 tablets (5-10 mg total) by mouth every 4 (four) hours as needed for pain.      pantoprazole 40 MG tablet   Commonly known as: PROTONIX   Take 40 mg by mouth daily as needed. For  reflux      potassium chloride SA 20 MEQ tablet   Commonly known as: K-DUR,KLOR-CON   Take 1 tablet (20 mEq total) by mouth daily. For 5 days then stop.      pravastatin 40 MG tablet   Commonly known as: PRAVACHOL   Take 40 mg by mouth daily.            Follow Up Appointments: Follow-up Information    Follow up with Olga Millers, MD. (Call for an appointment for 2 weeks)    Contact information:   [address] [place], [address] [place] [address] [phone]       Follow up with Purcell Nails, MD. (PA/LAT CXR to be taken 45 minutes prior to office appointment. Office will call with an appointment with Dr. Cornelius Moras )    Contact information:   [address] [place] [address] [phone]       Follow up with Carylon Perches, MD. (Call for an appointment regarding HGA1C pre op 5.8)    Contact information:  [address] [place] [address] [phone]          Signed: Doree Fudge MPA-C 07/09/2011, 11:27 AM

## 2011-07-09 NOTE — Progress Notes (Addendum)
Pt ambulated approximately 300 feet without any SOB or distress. Pt said she felt fine and would walk again later.  Pt ambulated with O2 at 2 liters [place] and front wheel rolling walker.  Pt returned to room and sitting in recliner with friend at bedside.  Will continue to monitor. Thomas Hoff

## 2011-07-09 NOTE — Progress Notes (Signed)
Assessed patient's ambulation status. Patient stated she had ambulated three times today. Will continue to monitor.

## 2011-07-10 MED ORDER — TRAMADOL HCL 50 MG PO TABS: 50.0000 mg | ORAL_TABLET | ORAL | Status: AC | PRN

## 2011-07-10 NOTE — Progress Notes (Signed)
   CARE MANAGEMENT NOTE 07/10/2011  Patient:  Terri Edwards, Terri Edwards   Account Number:  [id number]  Date Initiated:  07/05/2011  Documentation initiated by:  Budd Freiermuth  Subjective/Objective Assessment:   PT S/P AVR ON 07/05/11.  PTA, PT INDEPENDENT, LIVES WITH SPOUSE.     Action/Plan:   PT JUST OUT OF SURGERY, STILL INTUBATED.  WILL FOLLOW UP WHEN EXTUBATED.   Anticipated DC Date:  07/10/2011   Anticipated DC Plan:  HOME W HOME HEALTH SERVICES      DC Planning Services  CM consult      Choice offered to / List presented to:             Status of service:  Completed, signed off Medicare Important Message given?   (If response is "NO", the following Medicare IM given date fields will be blank) Date Medicare IM given:   Date Additional Medicare IM given:    Discharge Disposition:  HOME/SELF CARE  Per UR Regulation:    If discussed at Long Length of Stay Meetings, dates discussed:    Comments:  07/10/11 Clotile Whittington,RN,BSN [phone] PT TO DC HOME WITH SPOUSE TO PROVIDE 24HR CARE.  DENIES ANY HOME NEEDS.  07/07/11 Brad Mcgaughy,RN,BSN PTA, PT INDEPENDENT, LIVES WITH SPOUSE.  WILL FOLLOW FOR HOME NEEDS AS PT PROGRESSES.

## 2011-07-10 NOTE — Progress Notes (Addendum)
5 Days Post-Op Procedure(s) (LRB): AORTIC VALVE REPLACEMENT (AVR) (N/A) Subjective:  Terri Edwards has no complaints this morning.  She states she feels pretty good and would like to be discharged home today.  Objective: Vital signs in last 24 hours: Temp:  [98 F (36.7 C)-98.6 F (37 C)] 98.6 F (37 C) (04/22 0530) Pulse Rate:  [87-90] 88  (04/22 0530) Cardiac Rhythm:  [-] Normal sinus rhythm (04/21 1915) Resp:  [18] 18  (04/22 0530) BP: (97-113)/(61-69) 113/69 mmHg (04/22 0530) SpO2:  [90 %-93 %] 91 % (04/22 0530) Weight:  [190 lb 7.6 oz (86.4 kg)] 190 lb 7.6 oz (86.4 kg) (04/22 0530)  Intake/Output from previous day: 04/21 0701 - 04/22 0700 In: 363 [P.O.:360; I.V.:3] Out: -     General appearance: alert, cooperative and no distress Neurologic: intact Heart: regular rate and rhythm Lungs: clear to auscultation bilaterally Abdomen: soft, non-tender; bowel sounds normal; no masses,  no organomegaly Extremities: edema 1+ Wound: clean and dry  Lab Results:  [facility] 07/08/11 0620  WBC 12.4*  HGB 8.9*  HCT 27.5*  PLT 148*   BMET:  Basename 07/08/11 0620  NA 137  K 4.0  CL 101  CO2 30  GLUCOSE 110*  BUN 19  CREATININE 0.82  CALCIUM 8.9    PT/INR: No results found for this basename: LABPROT,INR in the last 72 hours ABG    Component Value Date/Time   PHART 7.300* 07/05/2011 1932   HCO3 23.9 07/05/2011 1932   TCO2 23 07/06/2011 1650   ACIDBASEDEF 3.0* 07/05/2011 1932   O2SAT 98.0 07/05/2011 1932   CBG (last 3)   Basename 07/09/11 0631 07/08/11 2138 07/08/11 1608  GLUCAP 110* 116* 116*    Assessment/Plan: S/P Procedure(s) (LRB): AORTIC VALVE REPLACEMENT (AVR) (N/A)  2. CV- NSR rate and blood pressure controlled with Lopressor 3. Resp- patient weaned off oxygen doing well, will continue IS as discharge 4. Fluid Balance- patient with good UO, weight up 2kg since admission, will continue Lasix 5. Dispo- will d/c EPW today, if no arrythmia develops will d/c  home this afternoon   LOS: 5 days    Edwards, Terri 07/10/2011   I have seen and examined the patient and agree with the assessment and plan as outlined.  Daking Westervelt H 07/10/2011 1:25 PM

## 2011-07-10 NOTE — Progress Notes (Signed)
Patient was coughing and called out for some cough medicine. Patient was given Robitussin per MD order. Patient was reassessed and stated that it was effective. Will continue to monitor.

## 2011-07-10 NOTE — Progress Notes (Signed)
Pt smokes 1 ppd and says she plans to quit cold [place] after d/c. 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 Discussed different pharmcotherapy options available if smoking cold [place] fails. Pt verbalizes understanding. Referred to 1-800 quit now for f/u and support. Discussed oral fixation substitutes, second hand smoke and in home smoking policy. Reviewed and gave pt Written education/contact information.

## 2011-07-10 NOTE — Progress Notes (Signed)
CARDIAC REHAB PHASE I   PRE:  Rate/Rhythm: 83 SR    BP: sitting 109/53    SaO2: 92 1L, 90 finger RA, 95 ear RA  MODE:  Ambulation: 550 ft   POST:  Rate/Rhythm: 100 ST    BP: sitting 124/66     SaO2: 93 RA walking on ear, would not register after walk  Tolerated well. SaO2 good on ear, low on finger. Ed completed. Requests her name be sent to [place] [facility]. [phone]  Terri Edwards CES, ACSM

## 2011-07-10 NOTE — Progress Notes (Signed)
UR Completed.  [name] [phone] 07/10/2011  

## 2011-07-10 NOTE — Progress Notes (Signed)
Pacing wires and CT sutures removed without difficulty.  Pt tolerated procedure with no complications or complaints.  Instructed to remain in bed for one hour.  Bedrest ends at 1330.  Call bell in reach, bed in low position.  Husband at bedside. Will continue to monitor.

## 2011-07-11 LAB — TYPE AND SCREEN
ABO/RH(D): A POS
Antibody Screen: NEGATIVE
Unit division: 0

## 2011-07-13 ENCOUNTER — Inpatient Hospital Stay (HOSPITAL_COMMUNITY): Payer: BC Managed Care – PPO

## 2011-07-13 ENCOUNTER — Ambulatory Visit (INDEPENDENT_AMBULATORY_CARE_PROVIDER_SITE_OTHER): Payer: BC Managed Care – PPO | Admitting: Cardiology

## 2011-07-13 ENCOUNTER — Encounter: Payer: Self-pay | Admitting: Cardiology

## 2011-07-13 ENCOUNTER — Inpatient Hospital Stay (HOSPITAL_COMMUNITY)
Admission: AD | Admit: 2011-07-13 | Discharge: 2011-07-21 | DRG: 544 | Disposition: A | Discharge status: Home / Self Care | Payer: BC Managed Care – PPO | Source: Ambulatory Visit | Attending: Cardiology | Admitting: Cardiology

## 2011-07-13 ENCOUNTER — Telehealth: Payer: Self-pay | Admitting: Cardiology

## 2011-07-13 VITALS — BP 130/70 | HR 151 | Ht 62.0 in | Wt 189.8 lb

## 2011-07-13 DIAGNOSIS — D72829 Elevated white blood cell count, unspecified: Secondary | ICD-10-CM | POA: Diagnosis present

## 2011-07-13 DIAGNOSIS — R918 Other nonspecific abnormal finding of lung field: Secondary | ICD-10-CM

## 2011-07-13 DIAGNOSIS — I5031 Acute diastolic (congestive) heart failure: Secondary | ICD-10-CM | POA: Diagnosis present

## 2011-07-13 DIAGNOSIS — I480 Paroxysmal atrial fibrillation: Secondary | ICD-10-CM | POA: Insufficient documentation

## 2011-07-13 DIAGNOSIS — K219 Gastro-esophageal reflux disease without esophagitis: Secondary | ICD-10-CM | POA: Diagnosis present

## 2011-07-13 DIAGNOSIS — Z954 Presence of other heart-valve replacement: Secondary | ICD-10-CM

## 2011-07-13 DIAGNOSIS — F17200 Nicotine dependence, unspecified, uncomplicated: Secondary | ICD-10-CM

## 2011-07-13 DIAGNOSIS — E785 Hyperlipidemia, unspecified: Secondary | ICD-10-CM | POA: Diagnosis present

## 2011-07-13 DIAGNOSIS — R0902 Hypoxemia: Secondary | ICD-10-CM | POA: Diagnosis not present

## 2011-07-13 DIAGNOSIS — Z87891 Personal history of nicotine dependence: Secondary | ICD-10-CM

## 2011-07-13 DIAGNOSIS — I4891 Unspecified atrial fibrillation: Principal | ICD-10-CM | POA: Diagnosis present

## 2011-07-13 DIAGNOSIS — J90 Pleural effusion, not elsewhere classified: Secondary | ICD-10-CM | POA: Diagnosis present

## 2011-07-13 DIAGNOSIS — J189 Pneumonia, unspecified organism: Secondary | ICD-10-CM

## 2011-07-13 DIAGNOSIS — E669 Obesity, unspecified: Secondary | ICD-10-CM | POA: Diagnosis present

## 2011-07-13 DIAGNOSIS — I959 Hypotension, unspecified: Secondary | ICD-10-CM | POA: Diagnosis present

## 2011-07-13 DIAGNOSIS — I2699 Other pulmonary embolism without acute cor pulmonale: Secondary | ICD-10-CM | POA: Diagnosis present

## 2011-07-13 DIAGNOSIS — E876 Hypokalemia: Secondary | ICD-10-CM | POA: Diagnosis not present

## 2011-07-13 DIAGNOSIS — E8779 Other fluid overload: Secondary | ICD-10-CM

## 2011-07-13 DIAGNOSIS — G4730 Sleep apnea, unspecified: Secondary | ICD-10-CM | POA: Diagnosis present

## 2011-07-13 DIAGNOSIS — L988 Other specified disorders of the skin and subcutaneous tissue: Secondary | ICD-10-CM | POA: Diagnosis present

## 2011-07-13 DIAGNOSIS — R0602 Shortness of breath: Secondary | ICD-10-CM

## 2011-07-13 DIAGNOSIS — I10 Essential (primary) hypertension: Secondary | ICD-10-CM | POA: Diagnosis present

## 2011-07-13 DIAGNOSIS — E7800 Pure hypercholesterolemia, unspecified: Secondary | ICD-10-CM

## 2011-07-13 DIAGNOSIS — Z882 Allergy status to sulfonamides status: Secondary | ICD-10-CM

## 2011-07-13 DIAGNOSIS — Z953 Presence of xenogenic heart valve: Secondary | ICD-10-CM

## 2011-07-13 DIAGNOSIS — I509 Heart failure, unspecified: Secondary | ICD-10-CM | POA: Diagnosis present

## 2011-07-13 DIAGNOSIS — J811 Chronic pulmonary edema: Secondary | ICD-10-CM | POA: Diagnosis not present

## 2011-07-13 DIAGNOSIS — G510 Bell's palsy: Secondary | ICD-10-CM | POA: Diagnosis present

## 2011-07-13 LAB — CBC
HCT: 27.6 % — ABNORMAL LOW (ref 36.0–46.0)
MCHC: 32.2 g/dL (ref 30.0–36.0)
MCV: 92.6 fL (ref 78.0–100.0)
Platelets: 333 10*3/uL (ref 150–400)
RDW: 14.6 % (ref 11.5–15.5)
WBC: 19.5 10*3/uL — ABNORMAL HIGH (ref 4.0–10.5)

## 2011-07-13 LAB — COMPREHENSIVE METABOLIC PANEL
ALT: 365 U/L — ABNORMAL HIGH (ref 0–35)
Albumin: 3.2 g/dL — ABNORMAL LOW (ref 3.5–5.2)
Alkaline Phosphatase: 154 U/L — ABNORMAL HIGH (ref 39–117)
BUN: 28 mg/dL — ABNORMAL HIGH (ref 6–23)
Chloride: 92 mEq/L — ABNORMAL LOW (ref 96–112)
Glucose, Bld: 121 mg/dL — ABNORMAL HIGH (ref 70–99)
Potassium: 4.7 mEq/L (ref 3.5–5.1)
Sodium: 134 mEq/L — ABNORMAL LOW (ref 135–145)
Total Bilirubin: 0.6 mg/dL (ref 0.3–1.2)
Total Protein: 6.2 g/dL (ref 6.0–8.3)

## 2011-07-13 LAB — URINALYSIS, ROUTINE W REFLEX MICROSCOPIC
Bilirubin Urine: NEGATIVE
Glucose, UA: NEGATIVE mg/dL
Hgb urine dipstick: NEGATIVE
Ketones, ur: NEGATIVE mg/dL
pH: 6 (ref 5.0–8.0)

## 2011-07-13 LAB — DIFFERENTIAL
Basophils Absolute: 0 10*3/uL (ref 0.0–0.1)
Eosinophils Absolute: 0 10*3/uL (ref 0.0–0.7)
Lymphs Abs: 3.9 10*3/uL (ref 0.7–4.0)
Monocytes Absolute: 1.4 10*3/uL — ABNORMAL HIGH (ref 0.1–1.0)
Monocytes Relative: 7 % (ref 3–12)
Neutro Abs: 14.2 10*3/uL — ABNORMAL HIGH (ref 1.7–7.7)

## 2011-07-13 LAB — TYPE AND SCREEN
ABO/RH(D): A POS
Antibody Screen: NEGATIVE

## 2011-07-13 LAB — TSH: TSH: 2.737 u[IU]/mL (ref 0.350–4.500)

## 2011-07-13 LAB — MAGNESIUM: Magnesium: 1.9 mg/dL (ref 1.5–2.5)

## 2011-07-13 LAB — PROTIME-INR
INR: 1.33 (ref 0.00–1.49)
Prothrombin Time: 16.7 seconds — ABNORMAL HIGH (ref 11.6–15.2)

## 2011-07-13 MED ORDER — SODIUM CHLORIDE 0.9 % IJ SOLN: 3.0000 mL | INTRAMUSCULAR | Status: DC | PRN

## 2011-07-13 MED ORDER — ACETAMINOPHEN 325 MG PO TABS: 650.0000 mg | ORAL_TABLET | ORAL | Status: DC | PRN

## 2011-07-13 MED ORDER — AMIODARONE HCL IN DEXTROSE 360-4.14 MG/200ML-% IV SOLN
60.0000 mg/h | INTRAVENOUS | Status: AC
  Administered 2011-07-13 (×3): 60 mg/h via INTRAVENOUS
  Filled 2011-07-13 (×3): qty 200

## 2011-07-13 MED ORDER — SODIUM CHLORIDE 0.9 % IJ SOLN
3.0000 mL | Freq: Two times a day (BID) | INTRAMUSCULAR | Status: DC
  Administered 2011-07-14 – 2011-07-21 (×14): 3 mL via INTRAVENOUS

## 2011-07-13 MED ORDER — HEPARIN (PORCINE) IN NACL 100-0.45 UNIT/ML-% IJ SOLN
1250.0000 [IU]/h | INTRAMUSCULAR | Status: DC
  Administered 2011-07-13: 1000 [IU]/h via INTRAVENOUS
  Filled 2011-07-13 (×3): qty 250

## 2011-07-13 MED ORDER — FUROSEMIDE 10 MG/ML IJ SOLN
40.0000 mg | Freq: Once | INTRAMUSCULAR | Status: DC
  Filled 2011-07-13: qty 4

## 2011-07-13 MED ORDER — HEPARIN BOLUS VIA INFUSION
4000.0000 [IU] | Freq: Once | INTRAVENOUS | Status: AC
  Administered 2011-07-13: 4000 [IU] via INTRAVENOUS
  Filled 2011-07-13: qty 4000

## 2011-07-13 MED ORDER — ONDANSETRON HCL 4 MG/2ML IJ SOLN
4.0000 mg | Freq: Four times a day (QID) | INTRAMUSCULAR | Status: DC | PRN
  Administered 2011-07-14: 4 mg via INTRAVENOUS
  Filled 2011-07-13: qty 2

## 2011-07-13 MED ORDER — SODIUM CHLORIDE 0.9 % IV SOLN: 250.0000 mL | INTRAVENOUS | Status: DC | PRN

## 2011-07-13 MED ORDER — DIGOXIN 0.25 MG/ML IJ SOLN
0.2500 mg | Freq: Once | INTRAMUSCULAR | Status: AC
  Administered 2011-07-14: 0.25 mg via INTRAVENOUS
  Filled 2011-07-13: qty 1

## 2011-07-13 MED ORDER — AMIODARONE HCL IN DEXTROSE 360-4.14 MG/200ML-% IV SOLN
30.0000 mg/h | INTRAVENOUS | Status: DC
  Administered 2011-07-14: 30 mg/h via INTRAVENOUS
  Filled 2011-07-13 (×3): qty 200

## 2011-07-13 MED ORDER — TRAMADOL HCL 50 MG PO TABS: 50.0000 mg | ORAL_TABLET | ORAL | Status: DC | PRN

## 2011-07-13 MED ORDER — PANTOPRAZOLE SODIUM 40 MG PO TBEC
40.0000 mg | DELAYED_RELEASE_TABLET | Freq: Every day | ORAL | Status: DC
  Administered 2011-07-14 – 2011-07-21 (×8): 40 mg via ORAL
  Filled 2011-07-13 (×8): qty 1

## 2011-07-13 MED ORDER — ASPIRIN EC 325 MG PO TBEC
325.0000 mg | DELAYED_RELEASE_TABLET | Freq: Every day | ORAL | Status: DC
  Administered 2011-07-14 – 2011-07-16 (×3): 325 mg via ORAL
  Filled 2011-07-13 (×4): qty 1

## 2011-07-13 MED ORDER — AMIODARONE LOAD VIA INFUSION
150.0000 mg | Freq: Once | INTRAVENOUS | Status: AC
  Administered 2011-07-13: 150 mg via INTRAVENOUS
  Filled 2011-07-13: qty 83.34

## 2011-07-13 MED ORDER — DIGOXIN 0.25 MG/ML IJ SOLN: 0.2500 mg | Freq: Once | INTRAMUSCULAR | Status: DC

## 2011-07-13 MED ORDER — ONDANSETRON HCL 4 MG/2ML IJ SOLN
INTRAMUSCULAR | Status: AC
  Administered 2011-07-13: 4 mg via INTRAVENOUS
  Filled 2011-07-13: qty 2

## 2011-07-13 MED ORDER — FUROSEMIDE 10 MG/ML IJ SOLN
40.0000 mg | Freq: Once | INTRAMUSCULAR | Status: AC
  Administered 2011-07-13: 40 mg via INTRAVENOUS
  Filled 2011-07-13: qty 4

## 2011-07-13 MED ORDER — DIGOXIN 0.25 MG/ML IJ SOLN
0.2500 mg | Freq: Once | INTRAMUSCULAR | Status: AC
  Administered 2011-07-13: 0.25 mg via INTRAVENOUS
  Filled 2011-07-13: qty 1

## 2011-07-13 MED ORDER — METOPROLOL SUCCINATE ER 25 MG PO TB24
25.0000 mg | ORAL_TABLET | Freq: Every day | ORAL | Status: DC
  Administered 2011-07-14 – 2011-07-21 (×6): 25 mg via ORAL
  Filled 2011-07-13 (×8): qty 1

## 2011-07-13 NOTE — Progress Notes (Signed)
HPI: Pleasant female with aortic stenosis s/p AVR who presents in followup. Echo in Jan 2013 showed normal LV function, severe AS with mean gradient of 50 mmHg, mild LAE. She also had carotid Dopplers on May 29, 2008 that showed 0-39% bilateral stenosis. CTA in Jan 2013 showed no aneurysm. Cardiac catheterization in April of 2013 showed no coronary disease, normal systolic function. Patient had aortic valve replacement on April 17 with a pericardial tissue valve. Patient was discharged on April 21. Following discharge she did note mild dyspnea on exertion. She began noticing mild palpitations last evening and increased shortness of breath. She had progressive increased dyspnea on exertion today. She has had orthopnea but no PND or pedal edema. She has had palpitations. No chest pain other than soreness. No fevers or chills. No increased cough.   Current Outpatient Prescriptions  Medication Sig Dispense Refill  . aspirin EC 325 MG EC tablet Take 1 tablet (325 mg total) by mouth daily.  30 tablet    . furosemide (LASIX) 40 MG tablet Take 1 tablet (40 mg total) by mouth daily. For 5 days then stop.  5 tablet  0  . menthol-cetylpyridinium (CEPACOL) 3 MG lozenge Take 1 lozenge (3 mg total) by mouth as needed.  100 tablet    . metoprolol succinate (TOPROL-XL) 25 MG 24 hr tablet Take 1 tablet (25 mg total) by mouth daily.  30 tablet  1  . pantoprazole (PROTONIX) 40 MG tablet Take 40 mg by mouth daily as needed. For reflux      . potassium chloride SA (K-DUR,KLOR-CON) 20 MEQ tablet Take 1 tablet (20 mEq total) by mouth daily. For 5 days then stop.  5 tablet  0  . pravastatin (PRAVACHOL) 40 MG tablet Take 40 mg by mouth daily.      . traMADol (ULTRAM) 50 MG tablet Take 1-2 tablets (50-100 mg total) by mouth every 4 (four) hours as needed.  50 tablet  0     Past Medical History  Diagnosis Date  . HTN (hypertension), benign   . Hypercholesteremia   . Bell's palsy 2005  . Obesity (BMI 30.0-34.9)  06/29/2011  . Aortic stenosis   . GERD (gastroesophageal reflux disease)   . Arthritis   . S/P aortic valve replacement with bioprosthetic valve 07/05/2011    21mm [facility] Ease pericardial tissue valve  . Atrial fibrillation     Post operative following AVR    Past Surgical History  Procedure Date  . Knee arthroscopy lft  . Cataract extraction     bil  . 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 Aortic valve replacement 07/05/2011    Procedure: AORTIC VALVE REPLACEMENT (AVR);  Surgeon: Purcell Nails, MD;  Location: [facility] OR;  Service: Open Heart Surgery;  Laterality: N/A;    History   Social History  . Marital Status: Married    Spouse Name: N/A    Number of Children: N/A  . 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 Smoking status: Former Smoker -- 1.0 packs/day for 45 years    Types: Cigarettes    Quit date: 07/04/2011  . Smokeless tobacco: Never Used  . Alcohol Use: No  . Drug Use: Not on file  . Sexually Active: Yes    Birth Control/ Protection: Post-menopausal   Other Topics Concern  . Not on file   Social History Narrative  . No narrative  on file    ROS: Some chest soreness but no fevers or chills, productive cough, hemoptysis, dysphasia, odynophagia, melena, hematochezia, dysuria, hematuria, rash, seizure activity, PND, pedal edema, claudication. Remaining systems are negative.  Physical Exam: Well-developed well-nourished in mild respiratory distress.  Skin is warm and dry.  No peripheral clubbing Patient does not appear to be depressed Back is normal HEENT is normal. Normal eyelids Neck is supple. No thyromegaly. Normal upstroke with no bruits. Chest with previous sternotomy; diminished breath sounds bilaterally left greater than right Cardiovascular exam is tachycardic and irregular; 1/6 systolic murmur left sternal border. No diastolic murmur noted. Abdominal exam no hepatosplenomegaly,  nontender or distended. No masses palpated. 2+ femoral pulses with no bruits. Extremities show trace edema. neuro grossly intact  ECG atrial fibrillation at a rate of 151. Axis normal. Inferolateral T wave changes.

## 2011-07-13 NOTE — Assessment & Plan Note (Signed)
Continue present medications. 

## 2011-07-13 NOTE — Assessment & Plan Note (Addendum)
Sternotomy without evidence of infection. We'll plan echocardiogram for baseline study post aVR.

## 2011-07-13 NOTE — Telephone Encounter (Signed)
New msg Pt's daughter called and said her BP is 94/60 and hr is 136. She does take metoprolol. She wants to talk to you about her heart rate. Please call

## 2011-07-13 NOTE — Assessment & Plan Note (Signed)
Continue statin. 

## 2011-07-13 NOTE — Progress Notes (Signed)
Patient transferred to the step down unit for atrial fibrillation with a rapid ventricular response and hypoxia. Laboratories are now available for review. Her BUN and creatinine are mildly increased compared to previous at 28 and 1.25. Her SGOT and SGPT are elevated at 257 and 365 respectively. Troponin minimally elevated at 0.38. Hemoglobin 8.9 which is unchanged compared to previous. White blood cell count 19.5. Chest x-ray reviewed and shows bilateral pleural effusions which appear to be unchanged compared to previous. Quick look echocardiogram at bedside shows preserved LV function. There is no pericardial effusion. Right heart not well visualized. I think most of patient symptoms are related to atrial fibrillation with a rapid ventricular response and volume excess. Her LFTs may be elevated from hepatic congestion. Her blood pressure was borderline and we are therefore treating with IV amiodarone for rate control and hopefully she will convert on her own. She presently feels better and her heart rate has decreased to 110-120. Her systolic blood pressure now is 104. If she deteriorates we will proceed with cardioversion. I will add intravenous heparin. We will continue to diuresis. Recheck potassium and renal function tomorrow morning. Note her white blood cell count is elevated but her sternotomy is without evidence of infection. There is no pneumonia on chest x-ray. No RUQ tenderness on exam. Check urinalysis. Patient is afebrile. She does not have a pericardial effusion by quick look echocardiogram. We'll plan for full echo to better assess LV function and aortic valve. Finally I have noted that her chest x-ray does have small effusions but is not dramatically different from previous. If she does not improve once sinus reestablished and with diuresis we will need to consider CT scan or VQ scan to exclude pulmonary embolus. She will be treated with IV heparin for atrial fibrillation and this should also  cover for pulmonary embolus although I think this is less likely. Her minimal elevation in troponin is most likely related to recent surgery and atrial fibrillation. She did not have coronary disease at time of preoperative cardiac catheterization. Patient also discussed with Dr. Cornelius Moras. Approximately one hour critical care time spent. 2:30-3:30 PM Terri Edwards

## 2011-07-13 NOTE — Telephone Encounter (Signed)
Spoke with pt, her heart rate is 136 this am and her bp is 104/50. They are uncertain if irregular or not. She is SOB but denies chest discomfort. Will discuss with dr Jens Som

## 2011-07-13 NOTE — Telephone Encounter (Signed)
Pt will come here to have an ekg.

## 2011-07-13 NOTE — Assessment & Plan Note (Signed)
Patient has discontinued. 

## 2011-07-13 NOTE — Significant Event (Signed)
Rapid Response Event Note  Overview:Called to assist with patient direct admit from [facility] with Afib /RVR -s/p AVR last week with tissue valve.  Patient reports increased dyspnea since last night.  Felt palpitations today.   Time Called: 1458 Arrival Time: 1505 Event Type: Cardiac  Initial Focused Assessment: Awake alert anxious c/o some SOB - tachypnea - bil BS clear decreased in bases. Trace edema extremities.  Color pale.  Skin w/d.  Monitor show irregular rhythm - Afib with 12 lead.  Patient denies chest pain except for soreness from surgery. No pericardial rub noted . No abd pain - abd soft.  Chest incision clean and intact. Patient denies cough.  No fevers or chills. Pedal pulse present.  BP 113/64   HR 151 RR 26 O2 sat 91% 4 liter n/c.     Interventions:  Stat 12 lead EKG and Stat PCXR.  VAST team here - 2 PIV's established.  Labs drawn.  Patients  O2 sats 88-91% on 4 liter nasal cannula.  Placed on 50% VM for higher flow.  O2 sats increased to 96%.  40 mg IV Lasix given per order Dr. Jens Som.  Quick look echo done at bedside per Dr. Jens Som.  Foley cath inserted.  1445: BP 112/64  HR 148 RR 24 O2 sats 95%.  1445: Amiodarone 150 mg IV bolus given per order Dr. Jens Som.  1450: 95/52 HR 128  1500: 97/69 HR 119 RR 24 O2 sats 94%.  Amiodarone drip at 60 mg/hr for next 6 hours.  Dr. Jens Som updated. Patient states she feels less palpitations now - remains SOB.  Foley catheter inserted without incident.  Moderate amount clear yellow urine.  1515: 99/54  HR 108  RR 24  O2 sats 95%.  Patient more relaxed - skin color more pink - less palpitations.  Zofran 4 mg IV given for nausea - patient states she has had nausea past week.  1530: Transferred to 2603 via bed with monitor and IV Amiodarone.  Tolerated transfer.  Handoff report to [facility].   Husband updated and support given.    Event Summary: Name of Physician Notified: Dr. Jens Som  at 1455    at    Outcome: Transferred (Comment) (to SDU   2603)  Event End Time: 1545  [place]

## 2011-07-13 NOTE — Assessment & Plan Note (Addendum)
The patient has developed postoperative atrial fibrillation. Her heart rate is 150. She has increased dyspnea. We will admit to telemetry. Continue toprol. Begin IV Cardizem for rate control. Hopefully she will convert on her own. We also could consider amiodarone. If she does not convert she will need addition of Coumadin. Note the duration of her atrial fibrillation is most likely less than 24 hours as she first noted symptoms last PM. Continue aspirin for now. Check TSH.   Addendum: on arrival to 3700, patient more dyspneic and SBP in the 90s; O2 Sat  88-89 on room air; HR 140-150 Plan transfer to stepdown; diurese; will not add cardizem as BP borderline; treat with IV amiodarone. Hopefully she will convert on her on; if she deteriorates further, may need to consider urgent DCCV; await chest xray; I think she appears volume overloaded; less likely pulmonary embolus. Patient also pale; await HGB. Olga Millers

## 2011-07-13 NOTE — Assessment & Plan Note (Signed)
Patient is volume overloaded on examination. She sounds to have a pleural effusion on the left. Check chest x-ray. IV diuresis with Lasix 40 mg twice a day. Follow K and renal function.

## 2011-07-13 NOTE — Progress Notes (Signed)
ANTICOAGULATION CONSULT NOTE - Initial Consult  Pharmacy Consult for Heparin Indication: atrial fibrillation  Allergies  Allergen Reactions  . Nickel     Rash blisters  . Sulfa Antibiotics     Blisters  Rash   . Tape     Slight skin irritation    Patient Measurements: Height: 5\' 2"  (157.5 cm) Weight: 189 lb 13.1 oz (86.1 kg) IBW/kg (Calculated) : 50.1  Heparin Dosing Weight: 69.7 kg  Vital Signs: Temp: 98.1 F (36.7 C) (04/25 1345) Temp src: Oral (04/25 1345) BP: 97/69 mmHg (04/25 1345) Pulse Rate: 145  (04/25 1345)  Labs:  Basename 07/13/11 1423 07/13/11 1422  HGB -- 8.9*  HCT -- 27.6*  PLT -- 333  APTT -- --  LABPROT -- 16.7*  INR -- 1.33  HEPARINUNFRC -- --  CREATININE -- 1.25*  CKTOTAL 133 --  CKMB 3.6 --  TROPONINI 0.38* --   Estimated Creatinine Clearance: 48.1 ml/min (by C-G formula based on Cr of 1.25).  Medical History: Past Medical History  Diagnosis Date  . HTN (hypertension), benign   . Hypercholesteremia   . Bell's palsy 2005  . Obesity (BMI 30.0-34.9) 06/29/2011  . Aortic stenosis   . GERD (gastroesophageal reflux disease)   . Arthritis   . S/P aortic valve replacement with bioprosthetic valve 07/05/2011    21mm [facility] Ease pericardial tissue valve  . Atrial fibrillation     Post operative following AVR   Assessment:    To begin heparin for atrial fibrillation.     POD # 8 -  tissue AVR (07/05/11- discharged 07/09/11)     Last inpatient CBC 07/08/11 - H/H 8.9/27.5, PLTC 148K.     Pre-op CBC 07/05/11 -  H/H 13.5/40.5, PLTC 318K.     Noted increased LFTs, possibly due to hepatic congestion.     Bedside echo showed no pericardial effusion.      Goal of Therapy:  Heparin level 0.3-0.7 units/ml   Plan:    Will begin Heparin with 4000 units IV bolus, then infusion at 1000 units/hr (~ 14 units/kg/hr).   Heparin level in ~ 6 hrs.   Daily heparin level and CBC.  Marya Landry Pager: [phone] 07/13/2011,3:32 PM

## 2011-07-14 ENCOUNTER — Encounter (HOSPITAL_COMMUNITY): Payer: Self-pay | Admitting: *Deleted

## 2011-07-14 DIAGNOSIS — I369 Nonrheumatic tricuspid valve disorder, unspecified: Secondary | ICD-10-CM

## 2011-07-14 LAB — CBC
Hemoglobin: 8.5 g/dL — ABNORMAL LOW (ref 12.0–15.0)
MCH: 29.9 pg (ref 26.0–34.0)
MCHC: 32.3 g/dL (ref 30.0–36.0)
MCV: 92.6 fL (ref 78.0–100.0)
Platelets: 309 10*3/uL (ref 150–400)
RBC: 2.84 MIL/uL — ABNORMAL LOW (ref 3.87–5.11)

## 2011-07-14 LAB — HEPARIN LEVEL (UNFRACTIONATED)
Heparin Unfractionated: 0.25 IU/mL — ABNORMAL LOW (ref 0.30–0.70)
Heparin Unfractionated: 0.34 IU/mL (ref 0.30–0.70)

## 2011-07-14 LAB — URINE CULTURE: Culture  Setup Time: 201304251705

## 2011-07-14 LAB — BASIC METABOLIC PANEL
CO2: 31 mEq/L (ref 19–32)
Chloride: 95 mEq/L — ABNORMAL LOW (ref 96–112)
Glucose, Bld: 109 mg/dL — ABNORMAL HIGH (ref 70–99)
Potassium: 4.1 mEq/L (ref 3.5–5.1)
Sodium: 136 mEq/L (ref 135–145)

## 2011-07-14 LAB — HEPATIC FUNCTION PANEL
AST: 115 U/L — ABNORMAL HIGH (ref 0–37)
Albumin: 3 g/dL — ABNORMAL LOW (ref 3.5–5.2)
Alkaline Phosphatase: 147 U/L — ABNORMAL HIGH (ref 39–117)
Total Bilirubin: 0.5 mg/dL (ref 0.3–1.2)
Total Protein: 6 g/dL (ref 6.0–8.3)

## 2011-07-14 MED ORDER — OFF THE BEAT BOOK
Freq: Once | Status: AC
  Administered 2011-07-14: 08:00:00
  Filled 2011-07-14: qty 1

## 2011-07-14 MED ORDER — WHITE PETROLATUM GEL
Status: AC
  Administered 2011-07-14: 13:00:00
  Filled 2011-07-14: qty 5

## 2011-07-14 MED ORDER — FUROSEMIDE 40 MG PO TABS
40.0000 mg | ORAL_TABLET | Freq: Every day | ORAL | Status: DC
  Administered 2011-07-14 – 2011-07-21 (×8): 40 mg via ORAL
  Filled 2011-07-14 (×8): qty 1

## 2011-07-14 MED ORDER — LIVING BETTER WITH HEART FAILURE BOOK
Freq: Once | Status: AC
  Administered 2011-07-14: 08:00:00
  Filled 2011-07-14: qty 1

## 2011-07-14 MED ORDER — HEPARIN (PORCINE) IN NACL 100-0.45 UNIT/ML-% IJ SOLN
1450.0000 [IU]/h | INTRAMUSCULAR | Status: DC
  Administered 2011-07-15 – 2011-07-18 (×3): 1450 [IU]/h via INTRAVENOUS
  Filled 2011-07-14 (×9): qty 250

## 2011-07-14 MED ORDER — AMIODARONE HCL 200 MG PO TABS
400.0000 mg | ORAL_TABLET | Freq: Two times a day (BID) | ORAL | Status: DC
  Administered 2011-07-14 – 2011-07-20 (×14): 400 mg via ORAL
  Filled 2011-07-14 (×17): qty 2

## 2011-07-14 MED ORDER — POTASSIUM CHLORIDE CRYS ER 20 MEQ PO TBCR
20.0000 meq | EXTENDED_RELEASE_TABLET | Freq: Every day | ORAL | Status: DC
  Administered 2011-07-14 – 2011-07-18 (×5): 20 meq via ORAL
  Filled 2011-07-14 (×6): qty 1

## 2011-07-14 NOTE — Progress Notes (Signed)
TCTS BRIEF PROGRESS NOTE   Terri Edwards is well known to me from recent AVR using a bioprosthetic tissue valve on 07/05/2011.  She was readmitted with rapid Afib and has now converted back to NSR on amiodarone.  She apparently was somewhat hypoxic and very dyspneic on admission but she feels much better now.  CXR and ECHO look good.  I agree with plans per Dr Jens Som.    [name],[name] 07/14/2011 5:00 PM

## 2011-07-14 NOTE — Progress Notes (Signed)
   CARE MANAGEMENT NOTE 07/14/2011  Patient:  ICYSS, SKOG   Account Number:  [id number]  Date Initiated:  07/14/2011  Documentation initiated by:  GRAVES-BIGELOW,Tavarus Poteete  Subjective/Objective Assessment:   Pt admitted with afib- has converted to NSR. Pt is from home with husband and has additional family support.     Action/Plan:   Per pt plan is for d/c 07-15-11. Pt states she has family support and will not need HH services at this time. Husband prepares meals and she has help with her baths.   Anticipated DC Date:  07/15/2011   Anticipated DC Plan:  HOME/SELF CARE      DC Planning Services  CM consult      Choice offered to / List presented to:             Status of service:  Completed, signed off Medicare Important Message given?   (If response is "NO", the following Medicare IM given date fields will be blank) Date Medicare IM given:   Date Additional Medicare IM given:    Discharge Disposition:  HOME/SELF CARE  Per UR Regulation:    If discussed at Long Length of Stay Meetings, dates discussed:    Comments:  07-14-11 1133 Tomi Bamberger, RN,BSN [phone] No further needs from CM at this time.

## 2011-07-14 NOTE — Progress Notes (Signed)
@   Subjective:  No Chest pain; dyspnea improving.   Objective:  Filed Vitals:   07/14/11 0000 07/14/11 0102 07/14/11 0345 07/14/11 0400  BP: 104/68 99/73 113/55 115/62  Pulse: 76 75 76 76  Temp: 98.4 F (36.9 C)  98.5 F (36.9 C)   TempSrc: Oral  Oral   Resp: 30 28 24 24   Height:      Weight: 86.8 kg (191 lb 5.8 oz)     SpO2: 94% 94% 92% 93%    Intake/Output from previous day:  Intake/Output Summary (Last 24 hours) at 07/14/11 0706 Last data filed at 07/14/11 0600  Gross per 24 hour  Intake    700 ml  Output    650 ml  Net     50 ml    Physical Exam: Physical exam: Well-developed well-nourished in no acute distress.  Skin is warm and dry.  HEENT is normal.  Neck is supple.  Chest with diminished BS bases (L>R); sternotomy without evidence of infection. Cardiovascular exam is regular rate and rhythm. 2/6 systolic murmur; no diastolic murmur. Abdominal exam nontender or distended. No masses palpated. Extremities show no edema. neuro grossly intact    Lab Results: Basic Metabolic Panel:  Basename 07/14/11 0350 07/13/11 1422  NA 136 134*  K 4.1 4.7  CL 95* 92*  CO2 31 27  GLUCOSE 109* 121*  BUN 28* 28*  CREATININE 1.33* 1.25*  CALCIUM 8.4 8.7  MG -- 1.9  PHOS -- --   CBC:  Basename 07/14/11 0350 07/13/11 1422  WBC 18.6* 19.5*  NEUTROABS -- 14.2*  HGB 8.5* 8.9*  HCT 26.3* 27.6*  MCV 92.6 92.6  PLT 309 333   Cardiac Enzymes:  Basename 07/13/11 1423  CKTOTAL 133  CKMB 3.6  CKMBINDEX --  TROPONINI 0.38*     Assessment/Plan:  1) Postoperative atrial fibrillation - patient has converted to sinus; Change amiodarone to 400 mg po BID; Decrease to 400 mg daily at DC for 2 weeks and then 200 mg daily. If she maintains sinus, will DC amiodarone in 8-12 weeks. Echo today. Continue IV heparin and ASA. If she maintains sinus, will not add coumadin. If she has any further atrial fibrillation, will add coumadin short term. Given significant improvement with  diuresis and restoration of sinus, doubt pulmonary embolus. 2) Postoperative volume excess - Will treat with lasix 40 mg po daily; follow renal function; still with significant pleural effusions on exam. Volume status should also improve now that sinus has been restored. 3) S/P AVR - Echo today. 4) Elevated WBC - no signs or symptoms of infection; chest xray and UA ok; afebrile; follow 5) Elevated LFTs - improving; may be secondary to congestion; no RUQ tenderness; follow.  DC foley; wean O2.  Patient can most likely be [facility] tomorrow or Sunday if stable; check BMET one week after DC; FU with me in 2 weeks.   Olga Millers 07/14/2011, 7:06 AM

## 2011-07-14 NOTE — Progress Notes (Signed)
ANTICOAGULATION CONSULT NOTE - Follow Up Consult  Pharmacy Consult for heparin Indication: atrial fibrillation  Labs:  Basename 07/14/11 1917 07/14/11 1122 07/14/11 0350 07/13/11 1423 07/13/11 1422  HGB -- -- 8.5* -- 8.9*  HCT -- -- 26.3* -- 27.6*  PLT -- -- 309 -- 333  APTT -- -- -- -- --  LABPROT -- -- -- -- 16.7*  INR -- -- -- -- 1.33  HEPARINUNFRC 0.34 0.25* 0.21* -- --  CREATININE -- -- 1.33* -- 1.25*  CKTOTAL -- -- -- 133 --  CKMB -- -- -- 3.6 --  TROPONINI -- -- -- 0.38* --    Assessment/Plan: 61yo female  on heparin for new-onset symptomatic Afib with RVR. Heparin came back subtherapeutic this AM with rate increased.  Follow-up heparin level is 0.34. PE is unlikely per notes.  She is now in NSR and notes state likely home tomorrow if remains stable.   Continue heparin at current rate of 1450 units/hr and follow-up with am labs   Dannielle Huh PharmD BCPS 07/14/2011,8:37 PM

## 2011-07-14 NOTE — Progress Notes (Signed)
*  PRELIMINARY RESULTS* Echocardiogram 2D Echocardiogram has been performed.  Terri Edwards 07/14/2011, 11:33 AM

## 2011-07-14 NOTE — Progress Notes (Signed)
UR Completed. Simmons, Shanina Kepple F [phone]  

## 2011-07-14 NOTE — Progress Notes (Signed)
ANTICOAGULATION CONSULT NOTE - Follow Up Consult  Pharmacy Consult for heparin Indication: atrial fibrillation/?PE  Labs:  Basename 07/14/11 1122 07/14/11 0350 07/13/11 2305 07/13/11 1423 07/13/11 1422  HGB -- 8.5* -- -- 8.9*  HCT -- 26.3* -- -- 27.6*  PLT -- 309 -- -- 333  APTT -- -- -- -- --  LABPROT -- -- -- -- 16.7*  INR -- -- -- -- 1.33  HEPARINUNFRC 0.25* 0.21* 0.30 -- --  CREATININE -- 1.33* -- -- 1.25*  CKTOTAL -- -- -- 133 --  CKMB -- -- -- 3.6 --  TROPONINI -- -- -- 0.38* --    Assessment/Plan: 61yo female  on heparin for new-onset symptomatic Afib with RVR. Heparin came back subtherapeutic this AM. Will adjust rate and check 6hr level.  Increase heparin to 1450 units/hr F/u with 6hr heparin level   Terri Edwards PharmD BCPS 07/14/2011,12:55 PM

## 2011-07-14 NOTE — Progress Notes (Addendum)
ANTICOAGULATION CONSULT NOTE - Follow Up Consult  Pharmacy Consult for heparin Indication: atrial fibrillation/?PE  Labs:  Basename 07/13/11 2305 07/13/11 1423 07/13/11 1422  HGB -- -- 8.9*  HCT -- -- 27.6*  PLT -- -- 333  APTT -- -- --  LABPROT -- -- 16.7*  INR -- -- 1.33  HEPARINUNFRC 0.30 -- --  CREATININE -- -- 1.25*  CKTOTAL -- 133 --  CKMB -- 3.6 --  TROPONINI -- 0.38* --    Assessment/Plan: 61yo female therapeutic on heparin with initial dosing for new-onset symptomatic Afib with RVR though at very low end of goal; also considering PE.  Will increase heparin gtt modestly to 1100 units/hr and confirm with am labs.   Colleen Can PharmD BCPS 07/14/2011,12:06 AM   Addendum: AM labs drawn a little early but heparin level dropped even further to 0.21 after rate increase.  Will increase gtt to 1250 units/hr and check level in 6-8hr.   VB  07/14/2011 4:49 AM

## 2011-07-15 LAB — COMPREHENSIVE METABOLIC PANEL
ALT: 177 U/L — ABNORMAL HIGH (ref 0–35)
AST: 49 U/L — ABNORMAL HIGH (ref 0–37)
Alkaline Phosphatase: 130 U/L — ABNORMAL HIGH (ref 39–117)
CO2: 31 mEq/L (ref 19–32)
Chloride: 95 mEq/L — ABNORMAL LOW (ref 96–112)
GFR calc non Af Amer: 62 mL/min — ABNORMAL LOW (ref >90)
Glucose, Bld: 119 mg/dL — ABNORMAL HIGH (ref 70–99)
Sodium: 134 mEq/L — ABNORMAL LOW (ref 135–145)
Total Bilirubin: 0.5 mg/dL (ref 0.3–1.2)

## 2011-07-15 LAB — CBC
HCT: 25 % — ABNORMAL LOW (ref 36.0–46.0)
Hemoglobin: 8.2 g/dL — ABNORMAL LOW (ref 12.0–15.0)
RBC: 2.74 MIL/uL — ABNORMAL LOW (ref 3.87–5.11)

## 2011-07-15 LAB — HEPARIN LEVEL (UNFRACTIONATED): Heparin Unfractionated: 0.55 IU/mL (ref 0.30–0.70)

## 2011-07-15 MED ORDER — WARFARIN - PHYSICIAN DOSING INPATIENT
Freq: Every day | Status: DC
  Administered 2011-07-17: 18:00:00

## 2011-07-15 MED ORDER — WARFARIN SODIUM 5 MG PO TABS
5.0000 mg | ORAL_TABLET | Freq: Every day | ORAL | Status: DC
  Administered 2011-07-15 – 2011-07-17 (×3): 5 mg via ORAL
  Filled 2011-07-15 (×4): qty 1

## 2011-07-15 MED ORDER — GUAIFENESIN-DM 100-10 MG/5ML PO SYRP
5.0000 mL | ORAL_SOLUTION | ORAL | Status: DC | PRN
  Administered 2011-07-15 – 2011-07-16 (×4): 5 mL via ORAL
  Filled 2011-07-15 (×4): qty 5

## 2011-07-15 NOTE — Progress Notes (Signed)
Patient ID: Terri Edwards, female   DOB: 17-Jul-1950, 61 y.o.   MRN: [id number] Subjective:  Dyspneic with standing. No chest pain.   Objective:  Vital Signs in the last 24 hours: Temp:  [97.9 F (36.6 C)-98.6 F (37 C)] 98.6 F (37 C) (04/27 1200) Pulse Rate:  [67-115] 115  (04/27 1200) Resp:  [14-27] 25  (04/27 1200) BP: (92-110)/(42-61) 110/61 mmHg (04/27 1200) SpO2:  [92 %-97 %] 95 % (04/27 1200) FiO2 (%):  [2 %] 2 % (04/27 0600) Weight:  [188 lb 15 oz (85.7 kg)] 188 lb 15 oz (85.7 kg) (04/27 0500)  Intake/Output from previous day: 04/26 0701 - 04/27 0700 In: 1711.7 [P.O.:1210; I.V.:501.7] Out: 700 [Urine:700] Intake/Output from this shift: Total I/O In: 120 [P.O.:120] Out: 150 [Urine:150]  Physical Exam: anxious appearing middle age womanNAD HEENT: Unremarkable Neck:  No JVD, no thyromegally Lymphatics:  No adenopathy Back:  No CVA tenderness Lungs:  Clear except for basilar rales. HEART:  Regular rate rhythm, no murmurs, no rubs, no clicks Abd:  soft, positive bowel sounds, no organomegally, no rebound, no guarding Ext:  2 plus pulses, no edema, no cyanosis, no clubbing Skin:  No rashes no nodules Neuro:  CN II through XII intact, motor grossly intact  Lab Results:  Basename 07/15/11 0435 07/14/11 0350  WBC 16.8* 18.6*  HGB 8.2* 8.5*  PLT 312 309    Basename 07/15/11 0435 07/14/11 0350  NA 134* 136  K 3.5 4.1  CL 95* 95*  CO2 31 31  GLUCOSE 119* 109*  BUN 19 28*  CREATININE 0.97 1.33*    Basename 07/13/11 1423  TROPONINI 0.38*   Hepatic Function Panel  Basename 07/15/11 0435 07/14/11 0350  PROT 5.9* --  ALBUMIN 2.8* --  AST 49* --  ALT 177* --  ALKPHOS 130* --  BILITOT 0.5 --  BILIDIR -- 0.2  IBILI -- 0.3   No results found for this basename: CHOL in the last 72 hours No results found for this basename: PROTIME in the last 72 hours  Imaging: Dg Chest Port 1 View  07/13/2011  *RADIOLOGY REPORT*  Clinical Data: 61 year old female with  shortness of breath, hypotension, atrial fibrillation.  PORTABLE CHEST - 1 VIEW  Comparison: 07/08/2011 and earlier.  Findings: Portable seated AP view at 1429 hours.  Stable lung volumes. Stable cardiomegaly and mediastinal contours.  Sequelae of cardiac valve replacement.  Small left greater than right pleural effusions and atelectasis not significantly changed.  No edema or pneumothorax.  IMPRESSION: Stable bilateral pleural effusions and atelectasis.  Original Report Authenticated By: Harley Hallmark, M.D.    Cardiac Studies: Tele - NSR to Atrial fib with an RVR Assessment/Plan:  1. Post op Atrial fib with an RVR - She has reverted back to atrial fib. Will continue amiodarone and start coumadin. 2. Acute diastolic CHF - she is fairly well compensated at the moment. She will improve once back in NSR.   LOS: 2 days    Lewayne Bunting 07/15/2011, 12:35 PM

## 2011-07-15 NOTE — Progress Notes (Signed)
ANTICOAGULATION CONSULT NOTE - Follow Up Consult  Pharmacy Consult for Heparin Indication: atrial fibrillation  Labs:  Basename 07/15/11 0435 07/14/11 1917 07/14/11 1122 07/14/11 0350 07/13/11 1423 07/13/11 1422  HGB 8.2* -- -- 8.5* -- --  HCT 25.0* -- -- 26.3* -- 27.6*  PLT 312 -- -- 309 -- 333  APTT -- -- -- -- -- --  LABPROT -- -- -- -- -- 16.7*  INR -- -- -- -- -- 1.33  HEPARINUNFRC 0.55 0.34 0.25* -- -- --  CREATININE 0.97 -- -- 1.33* -- 1.25*  CKTOTAL -- -- -- -- 133 --  CKMB -- -- -- -- 3.6 --  TROPONINI -- -- -- -- 0.38* --    Assessment/Plan: 61yo female  on heparin for new-onset symptomatic Afib with RVR. Heparin therapeutic this AM with rate increased.  She is now in NSR and no bleeding complications with therapy noted.     Plan: 1).  Continue IV heparin at 1450 units/hr 2).  F/U AM labs and adjust as needed. 3).  F/U plans for ongoing Heparin needs  Nadara Mustard, PharmD., MS Clinical Pharmacist Pager:  [phone] Thank you for allowing pharmacy to be part of this patients care team. 07/15/2011,11:45 AM

## 2011-07-16 LAB — BASIC METABOLIC PANEL
BUN: 13 mg/dL (ref 6–23)
BUN: 12 mg/dL (ref 6–23)
Calcium: 8.7 mg/dL (ref 8.4–10.5)
Calcium: 8.4 mg/dL (ref 8.4–10.5)
Creatinine, Ser: 0.95 mg/dL (ref 0.50–1.10)
Creatinine, Ser: 0.88 mg/dL (ref 0.50–1.10)
GFR calc Af Amer: 73 mL/min — ABNORMAL LOW (ref >90)
GFR calc non Af Amer: 63 mL/min — ABNORMAL LOW (ref >90)
GFR calc non Af Amer: 69 mL/min — ABNORMAL LOW (ref >90)
Glucose, Bld: 109 mg/dL — ABNORMAL HIGH (ref 70–99)
Potassium: 3.7 mEq/L (ref 3.5–5.1)

## 2011-07-16 LAB — CBC
Hemoglobin: 8.7 g/dL — ABNORMAL LOW (ref 12.0–15.0)
MCH: 30 pg (ref 26.0–34.0)
MCHC: 32.1 g/dL (ref 30.0–36.0)

## 2011-07-16 LAB — HEPARIN LEVEL (UNFRACTIONATED): Heparin Unfractionated: 0.4 IU/mL (ref 0.30–0.70)

## 2011-07-16 MED ORDER — DIGOXIN 0.25 MG/ML IJ SOLN
0.2500 mg | Freq: Every day | INTRAMUSCULAR | Status: DC
  Administered 2011-07-16: 0.25 mg via INTRAVENOUS
  Filled 2011-07-16 (×2): qty 1

## 2011-07-16 NOTE — Progress Notes (Signed)
ANTICOAGULATION CONSULT NOTE - Follow Up Consult  Pharmacy Consult for Heparin/Warfarin  Indication: atrial fibrillation   Assessment/Plan: 61yo female  on heparin for new-onset symptomatic Afib with RVR. Heparin therapeutic this AM with rate increased.  She remains in Afib and no bleeding complications with therapy noted.  She is receiving Warfarin therapy per MD and her INR today is 1.1 after 1 x 5mg  dose.  She has the following noted drug/drug interaction medication potential:  Amiodarone + Warfarin =  ? INR -Moderate effect observed when amiodarone and warfarin are coadministered is attributable to inhibition of P4502C9, the isozyme of P-450  Tramadol + Warfarin = Tramadol ? INR -Moderate - (possible inhibition of CYP3A4-mediated warfarin metabolism) Monitor INR when starting or stopping tramadol; dose reductions of 25%-30% may be required; AMS considers empiric 0%-20% warfarin dose reduction  References:  C P J / R P C . J A N UA R Y / F E B RUA R Y 2 0 1 1 . VO L 1 4 4  , N O 1 Drug interactions involving warfarin: Practice tool and practical management tips Tammy Conley Canal, BSP, PharmD; Tori Milks, BSP, ACPR; Lorelle Gibbs, BScPharm, PharmD, FCSHP; Brantley Stage, PharmD, ACPR  Plan: 1).  Continue IV heparin at 1450 units/hr 2).  F/U AM labs and adjust as needed. 3).  F/U plans for ongoing Heparin needs 4).  Follow Warfarin with MD and potential drug/drug interactions.  Nadara Mustard, PharmD., MS Clinical Pharmacist Pager:  [phone] Thank you for allowing pharmacy to be part of this patients care team. 07/16/2011,10:06 AM  Labs:  Basename 07/16/11 0405 07/16/11 0015 07/15/11 0435 07/14/11 1917 07/14/11 0350 07/13/11 1423 07/13/11 1422  HGB 8.7* -- 8.2* -- -- -- --  HCT 27.1* -- 25.0* -- 26.3* -- --  PLT 341 -- 312 -- 309 -- --  APTT -- -- -- -- -- -- --  LABPROT -- 14.6 -- -- -- -- 16.7*  INR -- 1.12 -- -- -- -- 1.33  HEPARINUNFRC 0.40 -- 0.55 0.34 -- -- --   CREATININE 0.88 0.95 0.97 -- -- -- --  CKTOTAL -- -- -- -- -- 133 --  CKMB -- -- -- -- -- 3.6 --  TROPONINI -- -- -- -- -- 0.38* --

## 2011-07-16 NOTE — Progress Notes (Signed)
Patient ID: Terri Edwards, female   DOB: [birth year], 61 y.o.   MRN: [id number]  Subjective:   No chest pain. Palpitations persist.   Objective:  Vital Signs in the last 24 hours: Temp:  [97.9 F (36.6 C)-98.7 F (37.1 C)] 98.5 F (36.9 C) (04/28 0400) Pulse Rate:  [76-119] 113  (04/28 0400) Resp:  [19-27] 19  (04/28 0400) BP: (88-110)/(38-71) 90/64 mmHg (04/28 0400) SpO2:  [93 %-97 %] 96 % (04/28 0400) Weight:  [187 lb 9.8 oz (85.1 kg)] 187 lb 9.8 oz (85.1 kg) (04/28 0700)  Intake/Output from previous day: 04/27 0701 - 04/28 0700 In: 879 [P.O.:560; I.V.:319] Out: 950 [Urine:950] Intake/Output from this shift:    Physical Exam: well appearing middle age woman NAD HEENT: Unremarkable Neck:  No JVD, no thyromegally Lymphatics:  No adenopathy Back:  No CVA tenderness Lungs:  Clear except for basilar rales. HEART:  IRegular tachy rhythm, no murmurs, no rubs, no clicks Abd:  soft, positive bowel sounds, no organomegally, no rebound, no guarding Ext:  2 plus pulses, no edema, no cyanosis, no clubbing Skin:  No rashes no nodules Neuro:  CN II through XII intact, motor grossly intact  Lab Results:  Basename 07/16/11 0405 07/15/11 0435  WBC 16.9* 16.8*  HGB 8.7* 8.2*  PLT 341 312    Basename 07/16/11 0405 07/16/11 0015  NA 137 134*  K 3.7 3.5  CL 96 94*  CO2 32 31  GLUCOSE 109* 114*  BUN 12 13  CREATININE 0.88 0.95    Basename 07/13/11 1423  TROPONINI 0.38*   Hepatic Function Panel  Basename 07/15/11 0435 07/14/11 0350  PROT 5.9* --  ALBUMIN 2.8* --  AST 49* --  ALT 177* --  ALKPHOS 130* --  BILITOT 0.5 --  BILIDIR -- 0.2  IBILI -- 0.3   No results found for this basename: CHOL in the last 72 hours No results found for this basename: PROTIME in the last 72 hours  Imaging: No results found.  Cardiac Studies: Tele - Atrial fib with an RVR Assessment/Plan:  1. Post op Atrial fib with an RVR - She remains in atrial fib. Will continue amiodarone and  coumadin. I would like to change to IV amiodarone but her blood pressure is soft. Will hold anti-hypertensive meds and add IV digoxin. She may require DCCV.  2. Acute diastolic CHF - she is fairly well compensated at the moment. She will improve once back in NSR.   LOS: 3 days    Lewayne Bunting 07/16/2011, 7:59 AM

## 2011-07-17 ENCOUNTER — Inpatient Hospital Stay (HOSPITAL_COMMUNITY): Payer: BC Managed Care – PPO

## 2011-07-17 ENCOUNTER — Encounter (HOSPITAL_COMMUNITY): Payer: Self-pay | Admitting: Radiology

## 2011-07-17 ENCOUNTER — Other Ambulatory Visit (HOSPITAL_COMMUNITY): Payer: BC Managed Care – PPO

## 2011-07-17 DIAGNOSIS — R0902 Hypoxemia: Secondary | ICD-10-CM | POA: Diagnosis not present

## 2011-07-17 DIAGNOSIS — J811 Chronic pulmonary edema: Secondary | ICD-10-CM | POA: Diagnosis not present

## 2011-07-17 DIAGNOSIS — J90 Pleural effusion, not elsewhere classified: Secondary | ICD-10-CM

## 2011-07-17 DIAGNOSIS — I2699 Other pulmonary embolism without acute cor pulmonale: Secondary | ICD-10-CM

## 2011-07-17 DIAGNOSIS — J189 Pneumonia, unspecified organism: Secondary | ICD-10-CM | POA: Diagnosis not present

## 2011-07-17 DIAGNOSIS — R0602 Shortness of breath: Secondary | ICD-10-CM

## 2011-07-17 DIAGNOSIS — F17200 Nicotine dependence, unspecified, uncomplicated: Secondary | ICD-10-CM

## 2011-07-17 LAB — CBC
Hemoglobin: 8.6 g/dL — ABNORMAL LOW (ref 12.0–15.0)
MCV: 92.5 fL (ref 78.0–100.0)
Platelets: 343 10*3/uL (ref 150–400)
RBC: 2.93 MIL/uL — ABNORMAL LOW (ref 3.87–5.11)
WBC: 21.4 10*3/uL — ABNORMAL HIGH (ref 4.0–10.5)

## 2011-07-17 LAB — PROTIME-INR: INR: 1.3 (ref 0.00–1.49)

## 2011-07-17 LAB — BASIC METABOLIC PANEL
CO2: 32 mEq/L (ref 19–32)
Calcium: 8.8 mg/dL (ref 8.4–10.5)
Chloride: 97 mEq/L (ref 96–112)
Potassium: 3.9 mEq/L (ref 3.5–5.1)
Sodium: 138 mEq/L (ref 135–145)

## 2011-07-17 LAB — EXPECTORATED SPUTUM ASSESSMENT W GRAM STAIN, RFLX TO RESP C

## 2011-07-17 MED ORDER — DEXTROSE 5 % IV SOLN
1.0000 g | Freq: Two times a day (BID) | INTRAVENOUS | Status: DC
  Administered 2011-07-17 – 2011-07-20 (×6): 1 g via INTRAVENOUS
  Filled 2011-07-17 (×8): qty 1

## 2011-07-17 MED ORDER — VANCOMYCIN HCL IN DEXTROSE 1-5 GM/200ML-% IV SOLN
1000.0000 mg | Freq: Two times a day (BID) | INTRAVENOUS | Status: DC
  Administered 2011-07-17 – 2011-07-20 (×6): 1000 mg via INTRAVENOUS
  Filled 2011-07-17 (×7): qty 200

## 2011-07-17 MED ORDER — IOHEXOL 350 MG/ML SOLN
86.0000 mL | Freq: Once | INTRAVENOUS | Status: AC | PRN
  Administered 2011-07-17: 86 mL via INTRAVENOUS

## 2011-07-17 MED ORDER — LEVOFLOXACIN 500 MG PO TABS
500.0000 mg | ORAL_TABLET | Freq: Every day | ORAL | Status: DC
  Administered 2011-07-17: 500 mg via ORAL
  Filled 2011-07-17: qty 1

## 2011-07-17 NOTE — Progress Notes (Signed)
ANTIBIOTIC CONSULT NOTE - INITIAL  Pharmacy Consult for vancomycin and cefepime Indication: pneumonia  Allergies  Allergen Reactions  . Nickel     Rash blisters  . Sulfa Antibiotics     Blisters  Rash   . Tape     Slight skin irritation    Patient Measurements: Height: 5\' 2"  (157.5 cm) Weight: 188 lb 0.8 oz (85.3 kg) (standing scale) IBW/kg (Calculated) : 50.1  Adjusted Body Weight:   Vital Signs: Temp: 98.3 F (36.8 C) (04/29 1233) Temp src: Oral (04/29 1233) BP: 103/85 mmHg (04/29 0946) Pulse Rate: 88  (04/29 0946) Intake/Output from previous day: 04/28 0701 - 04/29 0700 In: 1258.5 [P.O.:920; I.V.:338.5] Out: 1154 [Urine:1152; Stool:2] Intake/Output from this shift: Total I/O In: 301.5 [P.O.:240; I.V.:61.5] Out: 200 [Urine:200]  Labs:  [facility] 07/17/11 0352 07/16/11 0405 07/16/11 0015 07/15/11 0435  WBC 21.4* 16.9* -- 16.8*  HGB 8.6* 8.7* -- 8.2*  PLT 343 341 -- 312  LABCREA -- -- -- --  CREATININE 0.89 0.88 0.95 --   Estimated Creatinine Clearance: 67.3 ml/min (by C-G formula based on Cr of 0.89). No results found for this basename: VANCOTROUGH:2,VANCOPEAK:2,VANCORANDOM:2,GENTTROUGH:2,GENTPEAK:2,GENTRANDOM:2,TOBRATROUGH:2,TOBRAPEAK:2,TOBRARND:2,AMIKACINPEAK:2,AMIKACINTROU:2,AMIKACIN:2, in the last 72 hours   Microbiology: Recent Results (from the past 720 hour(s))  SURGICAL PCR SCREEN     Status: Normal   Collection Time   07/03/11  3:38 PM      Component Value Range Status Comment   MRSA, PCR NEGATIVE  NEGATIVE  Final    Staphylococcus aureus NEGATIVE  NEGATIVE  Final   URINE CULTURE     Status: Normal   Collection Time   07/13/11  4:18 PM      Component Value Range Status Comment   Specimen Description URINE, CATHETERIZED   Final    Special Requests NONE   Final    Culture  Setup Time [id number]   Final    Colony Count NO GROWTH   Final    Culture NO GROWTH   Final    Report Status 07/14/2011 FINAL   Final     Medical History: Past Medical  History  Diagnosis Date  . HTN (hypertension), benign   . Hypercholesteremia   . Bell's palsy 2005  . Obesity (BMI 30.0-34.9) 06/29/2011  . Aortic stenosis   . GERD (gastroesophageal reflux disease)   . Arthritis   . S/P aortic valve replacement with bioprosthetic valve 07/05/2011    21mm [facility] Ease pericardial tissue valve  . Atrial fibrillation     Post operative following AVR    Medications:  Scheduled:    . amiodarone  400 mg Oral BID  . furosemide  40 mg Oral Daily  . metoprolol succinate  25 mg Oral Daily  . pantoprazole  40 mg Oral Q1200  . potassium chloride  20 mEq Oral Daily  . sodium chloride  3 mL Intravenous Q12H  . warfarin  5 mg Oral q1800  . Warfarin - Physician Dosing Inpatient   Does not apply q1800  . DISCONTD: aspirin EC  325 mg Oral Daily  . DISCONTD: digoxin  0.25 mg Intravenous Daily  . DISCONTD: levofloxacin  500 mg Oral Daily   Assessment: 61 yo female to begin vancomycin and cefepime for pneumonia and leukocytosis which has been present since admission. Renal function is normal.  I spoke with the nurse taking caring of this patient and told her to start the antibiotics after the blood cultures had been drawn.  Goal of Therapy:  Vancomycin trough level 15-20 mcg/ml  Plan:  1. Vancomycin 1000 mg IV q12h 2. Cefepime 1 g IV q12h 3. Antibiotics to begin after blood cultures drawn 4. Follow-up renal function and [address] Maryanna Shape 07/17/2011,3:29 PM

## 2011-07-17 NOTE — Progress Notes (Signed)
Bilateral lower extremity venous duplex completed.  Preliminary report is negative for DVT, SVT, or a Baker's cyst. 

## 2011-07-17 NOTE — Progress Notes (Signed)
ANTICOAGULATION CONSULT NOTE - Follow Up Consult  Pharmacy Consult for Heparin.  MD dosing Warfarin  Indication: atrial fibrillation   Assessment/Plan: 61yo female  on heparin for new-onset symptomatic Afib with RVR. Heparin therapeutic this AM at 0.34.  No bleeding noted.  Her CBC remains stable.  She is receiving Warfarin therapy per MD and her INR was not drawn today.  I have requested the lab to add this on to her morning heparin level.  She has the following noted drug/drug interaction medication potential:  Amiodarone + Warfarin =  ? INR -Moderate effect observed when amiodarone and warfarin are coadministered is attributable to inhibition of P4502C9, the isozyme of P-450  Tramadol + Warfarin = Tramadol ? INR -Moderate - (possible inhibition of CYP3A4-mediated warfarin metabolism) Monitor INR when starting or stopping tramadol; dose reductions of 25%-30% may be required; AMS considers empiric 0%-20% warfarin dose reduction  References:  C P J / R P C . J A N UA R Y / F E B RUA R Y 2 0 1 1 . VO L 1 4 4  , N O 1 Drug interactions involving warfarin: Practice tool and practical management tips Tammy Conley Canal, BSP, PharmD; Tori Milks, BSP, ACPR; Lorelle Gibbs, BScPharm, PharmD, FCSHP; Brantley Stage, PharmD, ACPR  Plan: 1).  Continue IV heparin at 1450 units/hr 2).  F/U AM labs and adjust as needed. 3).  F/U plans for ongoing Heparin needs 4).  Follow Warfarin with MD and potential drug/drug interactions.  Celedonio Miyamoto, PharmD, BCPS Clinical Pharmacist Pager [phone]  Thank you for allowing pharmacy to be part of this patients care team. 07/17/2011   Labs:  Basename 07/17/11 0352 07/16/11 0405 07/16/11 0015 07/15/11 0435  HGB 8.6* 8.7* -- --  HCT 27.1* 27.1* -- 25.0*  PLT 343 341 -- 312  APTT -- -- -- --  LABPROT -- -- 14.6 --  INR -- -- 1.12 --  HEPARINUNFRC 0.34 0.40 -- 0.55  CREATININE 0.89 0.88 0.95 --  CKTOTAL -- -- -- --  CKMB -- -- -- --  TROPONINI -- --  -- --

## 2011-07-17 NOTE — Consult Note (Signed)
Name: Terri Edwards MRN: [id number] DOB: August 30, 1950    LOS: 4  PCCM CONSULT NOTE  History of Present Illness: 61 year old female with PMH of AVR on 4/15 who presents to the hospital with SOB, noted to be in a-fib with RVR and a CTA was ordered.  CTA revealed by lateral PEs.  Patient was also noted to have bilateral pulmonary infiltrate and elevation in here WBC.  Denied any fever, chills, N/V but did report expectorating brown sputum with no blood in it.  She also did report bilateral lower extremities edema.  No previous personal or family history of blood clots.  Lines / Drains: PIV  Cultures: Blood 4/29>>> Urine 4/29>>> Sputum 4/29>>>  Antibiotics: Vanc 4/29>>> Cefepime 4/29>>>  Tests / Events: CTA 4/29>>>bilateral PEs with ?infract vs PNA.  Past Medical History  Diagnosis Date  . HTN (hypertension), benign   . Hypercholesteremia   . Bell's palsy 2005  . Obesity (BMI 30.0-34.9) 06/29/2011  . Aortic stenosis   . GERD (gastroesophageal reflux disease)   . Arthritis   . S/P aortic valve replacement with bioprosthetic valve 07/05/2011    21mm [facility] Ease pericardial tissue valve  . Atrial fibrillation     Post operative following AVR   Past Surgical History  Procedure Date  . Knee arthroscopy lft  . Cataract extraction     bil  . 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 Aortic valve replacement 07/05/2011    Procedure: AORTIC VALVE REPLACEMENT (AVR);  Surgeon: Purcell Nails, MD;  Location: [facility] OR;  Service: Open Heart Surgery;  Laterality: N/A;   Prior to Admission medications   Medication Sig Start Date End Date Taking? Authorizing Provider  aspirin EC 325 MG EC tablet Take 1 tablet (325 mg total) by mouth daily. 07/08/11 07/18/11 Yes Donielle Margaretann Loveless, PA  furosemide (LASIX) 40 MG tablet Take 1 tablet (40 mg total) by mouth daily. For 5 days then stop. 07/08/11 07/07/12 Yes Donielle Margaretann Loveless, PA  metoprolol succinate (TOPROL-XL) 25 MG 24 hr  tablet Take 1 tablet (25 mg total) by mouth daily. 07/08/11  Yes Donielle Margaretann Loveless, PA  pantoprazole (PROTONIX) 40 MG tablet Take 40 mg by mouth daily as needed. For reflux   Yes Historical Provider, MD  potassium chloride SA (K-DUR,KLOR-CON) 20 MEQ tablet Take 1 tablet (20 mEq total) by mouth daily. For 5 days then stop. 07/08/11 07/07/12 Yes Donielle Margaretann Loveless, PA  pravastatin (PRAVACHOL) 40 MG tablet Take 40 mg by mouth daily. 02/07/11 02/07/12 Yes Lewayne Bunting, MD  traMADol (ULTRAM) 50 MG tablet Take 1-2 tablets (50-100 mg total) by mouth every 4 (four) hours as needed. 07/10/11 07/20/11 Yes Erin R Barrett, PA  menthol-cetylpyridinium (CEPACOL) 3 MG lozenge Take 1 lozenge (3 mg total) by mouth as needed. 07/08/11 07/07/12  Donielle Margaretann Loveless, PA    Allergies Allergies  Allergen Reactions  . Nickel     Rash blisters  . Sulfa Antibiotics     Blisters  Rash   . Tape     Slight skin irritation   Family History Family History  Problem Relation Age of Onset  . Stroke Father   . Heart attack Mother    Social History  reports that she quit smoking about 1 weeks ago. Her smoking use included Cigarettes. She has a 45 pack-year smoking history. She has never used smokeless tobacco. She reports that she does not drink alcohol. Her drug history not on file.  Review Of Systems  11 points review of systems is negative with an exception of listed in HPI.  Vital Signs: Filed Vitals:   07/17/11 1233  BP:   Pulse:   Temp: 98.3 F (36.8 C)  Resp:    Intake/Output Summary (Last 24 hours) at 07/17/11 1435 Last data filed at 07/17/11 1000  Gross per 24 hour  Intake  878.5 ml  Output    852 ml  Net   26.5 ml   Physical Examination: General:  Well appearing, NAD. Neuro:  Alert and interactive, moving all ext to command. HEENT:  [place]/AT, PERRL, EOM-I, -LAN and MMM. Neck:  Supple, -LAN and -thyromegally.  -JVD.   Cardiovascular:  RRR, Nl S1/S2, -M/R/G. Lungs:  Bibasilar  crackles. Abdomen:  Soft, NT, ND and +BS. Musculoskeletal:  -edema and -tenderness. Skin:  Intact.  Labs and Imaging:   Labs: CBC    Component Value Date/Time   WBC 21.4* 07/17/2011 0352   RBC 2.93* 07/17/2011 0352   HGB 8.6* 07/17/2011 0352   HCT 27.1* 07/17/2011 0352   PLT 343 07/17/2011 0352   MCV 92.5 07/17/2011 0352   MCH 29.4 07/17/2011 0352   MCHC 31.7 07/17/2011 0352   RDW 14.7 07/17/2011 0352   LYMPHSABS 3.9 07/13/2011 1422   MONOABS 1.4* 07/13/2011 1422   EOSABS 0.0 07/13/2011 1422   BASOSABS 0.0 07/13/2011 1422    BMET    Component Value Date/Time   NA 138 07/17/2011 0352   K 3.9 07/17/2011 0352   CL 97 07/17/2011 0352   CO2 32 07/17/2011 0352   GLUCOSE 126* 07/17/2011 0352   BUN 10 07/17/2011 0352   CREATININE 0.89 07/17/2011 0352   CALCIUM 8.8 07/17/2011 0352   GFRNONAA 69* 07/17/2011 0352   GFRAA 79* 07/17/2011 0352    @cmet @ ABG    Component Value Date/Time   PHART 7.300* 07/05/2011 1932   PCO2ART 48.4* 07/05/2011 1932   PO2ART 112.0* 07/05/2011 1932   HCO3 23.9 07/05/2011 1932   TCO2 23 07/06/2011 1650   ACIDBASEDEF 3.0* 07/05/2011 1932   O2SAT 98.0 07/05/2011 1932     Lab 07/13/11 1422  MG 1.9   Lab Results  Component Value Date   CALCIUM 8.8 07/17/2011    Assessment and Plan: 61 year old female with new onset provoked PE and diffuse pulmonary infiltrate.  No evidence of hemoptysis to suggest acute severe pulmonary infarcts at this time.  WBC makes PNA much more likely.  1. PE: provoked after a period of immobility following AVR.  Plan: Anticoagulation with heparin.   Lower ext dopplers.   Transition to coumadin for 6-9 months depending on symptoms.   Titrate O2.    IS per RT protocol.   Will need hypercoagulable work up 2 wks post completion of anticoagulation course.  2. Leukocytosis/pulmonary infiltrate: unlikely to be true pulmonary infarcts but can not be ruled out.  Patient had leukocytosis upon presentation before the surgery.  Plan: D/C  levaquin.   Pan culture.   Vanc/cefepime.   F/U on culture and narrow accordingly.  3. Pleural effusion: ? Related to cardiac disorder.  Plan: Diureses per cards.   F/U imaging.  4. Smoking: quit 3 wks ago after 64 pack years of smoking, no indication for treatment as a COPD exacerbation however.  Koren Bound, M.D. Pulmonary and Critical Care Medicine [facility] [phone]  07/17/2011, 2:35 PM

## 2011-07-17 NOTE — Progress Notes (Addendum)
@   Subjective:  No Chest pain; mild dyspnea with exertion   Objective:  Filed Vitals:   07/17/11 0100 07/17/11 0132 07/17/11 0448 07/17/11 0500  BP: 99/53  99/58   Pulse: 105 99 114   Temp: 99.9 F (37.7 C) 99.9 F (37.7 C) 98.7 F (37.1 C)   TempSrc: Oral Oral Oral   Resp: 22 15 26    Height:      Weight:    85.3 kg (188 lb 0.8 oz)  SpO2: 94% 100% 97%     Intake/Output from previous day:  Intake/Output Summary (Last 24 hours) at 07/17/11 0730 Last data filed at 07/17/11 0636  Gross per 24 hour  Intake   1239 ml  Output   1154 ml  Net     85 ml    Physical Exam: Physical exam: Well-developed well-nourished in no acute distress.  Skin is warm and dry.  HEENT is normal.  Neck is supple.  Chest with diminished BS bases (L>R); sternotomy without evidence of infection. Cardiovascular exam is regular rate and rhythm. 2/6 systolic murmur; no diastolic murmur. Abdominal exam nontender or distended. No masses palpated. Extremities show trace edema. neuro grossly intact    Lab Results: Basic Metabolic Panel:  Basename 07/17/11 0352 07/16/11 0405  NA 138 137  K 3.9 3.7  CL 97 96  CO2 32 32  GLUCOSE 126* 109*  BUN 10 12  CREATININE 0.89 0.88  CALCIUM 8.8 8.4  MG -- --  PHOS -- --   CBC:  Basename 07/17/11 0352 07/16/11 0405  WBC 21.4* 16.9*  NEUTROABS -- --  HGB 8.6* 8.7*  HCT 27.1* 27.1*  MCV 92.5 93.4  PLT 343 341    Assessment/Plan:  1) Postoperative atrial fibrillation - patient has converted to sinus; Continue amiodarone 400 mg PO BID for 2 weeks, then 200 mg daily. If she maintains sinus, will DC amiodarone in 8-12 weeks. Continue IV heparin and discontinue ASA. Continue coumadin (goal INR 2-3). If she maintains sinus, will DC coumadin in 12 weeks. Continue toprol; DC digoxin. 2) Postoperative volume excess - Continue lasix 40 mg po daily; follow renal function; still with significant pleural effusions on exam. Volume status should also improve now that  sinus has been restored. 3) S/P AVR  4) Elevated WBC - chest xray and UA ok; afebrile; Productive cough; Add levaquin. 5) Elevated LFTs - improving; may be secondary to congestion; no RUQ tenderness  Wean O2; Given right side enlargement, will check chest CT to exclude PE.     Olga Millers 07/17/2011, 7:30 AM   Addendum; Chest CT shows pulmonary embolus; continue IV heparin until INR>2 with 48 hour overlap; check lower ext ultrasound; she continues to have productive cough; ? Pneumonia or all pulmonary infarct on chest CT. Continue levaquin; will ask pulmonary to evaluate particularly for antibiotic recommendations. Olga Millers

## 2011-07-18 ENCOUNTER — Encounter: Payer: Self-pay | Admitting: Thoracic Surgery (Cardiothoracic Vascular Surgery)

## 2011-07-18 DIAGNOSIS — J811 Chronic pulmonary edema: Secondary | ICD-10-CM

## 2011-07-18 DIAGNOSIS — I2699 Other pulmonary embolism without acute cor pulmonale: Secondary | ICD-10-CM

## 2011-07-18 DIAGNOSIS — J189 Pneumonia, unspecified organism: Secondary | ICD-10-CM

## 2011-07-18 LAB — BASIC METABOLIC PANEL
BUN: 7 mg/dL (ref 6–23)
CO2: 32 mEq/L (ref 19–32)
Calcium: 9.2 mg/dL (ref 8.4–10.5)
Chloride: 97 mEq/L (ref 96–112)
Creatinine, Ser: 0.85 mg/dL (ref 0.50–1.10)
GFR calc Af Amer: 84 mL/min — ABNORMAL LOW (ref >90)
GFR calc non Af Amer: 72 mL/min — ABNORMAL LOW (ref >90)
Glucose, Bld: 107 mg/dL — ABNORMAL HIGH (ref 70–99)
Potassium: 3.7 mEq/L (ref 3.5–5.1)
Sodium: 138 mEq/L (ref 135–145)

## 2011-07-18 LAB — CBC
MCH: 29.4 pg (ref 26.0–34.0)
MCV: 92.8 fL (ref 78.0–100.0)
Platelets: 317 10*3/uL (ref 150–400)
RDW: 14.7 % (ref 11.5–15.5)
WBC: 17.2 10*3/uL — ABNORMAL HIGH (ref 4.0–10.5)

## 2011-07-18 MED ORDER — WARFARIN SODIUM 2.5 MG PO TABS
2.5000 mg | ORAL_TABLET | Freq: Once | ORAL | Status: AC
  Administered 2011-07-18: 2.5 mg via ORAL
  Filled 2011-07-18: qty 1

## 2011-07-18 MED ORDER — PATIENT'S GUIDE TO USING COUMADIN BOOK
Freq: Once | Status: AC
  Administered 2011-07-18: 16:00:00
  Filled 2011-07-18: qty 1

## 2011-07-18 MED ORDER — WARFARIN - PHARMACIST DOSING INPATIENT: Freq: Every day | Status: DC

## 2011-07-18 MED ORDER — BACITRACIN-NEOMYCIN-POLYMYXIN OINTMENT TUBE
TOPICAL_OINTMENT | Freq: Every day | CUTANEOUS | Status: DC
  Administered 2011-07-18 – 2011-07-21 (×4): via TOPICAL
  Filled 2011-07-18: qty 15

## 2011-07-18 MED ORDER — WARFARIN VIDEO
Freq: Once | Status: AC
  Administered 2011-07-18: 18:00:00

## 2011-07-18 NOTE — Progress Notes (Signed)
@   Subjective:  No Chest pain; mild dyspnea with exertion   Objective:  Filed Vitals:   07/18/11 0406 07/18/11 0500 07/18/11 0600 07/18/11 0759  BP: 109/51   110/55  Pulse: 111 77 84 94  Temp: 98.1 F (36.7 C)   98.1 F (36.7 C)  TempSrc: Oral   Oral  Resp:  23 27 28   Height:      Weight:      SpO2:  95% 94% 95%    Intake/Output from previous day:  Intake/Output Summary (Last 24 hours) at 07/18/11 0857 Last data filed at 07/18/11 0802  Gross per 24 hour  Intake    498 ml  Output   2200 ml  Net  -1702 ml    Physical Exam: Physical exam: Well-developed well-nourished in no acute distress.  Skin is warm and dry.  HEENT is normal.  Neck is supple.  Chest with diminished BS bases (L>R); sternotomy without evidence of infection. Cardiovascular exam is regular rate and rhythm. 2/6 systolic murmur; no diastolic murmur. Abdominal exam nontender or distended. No masses palpated. Extremities show trace-1+ edema. neuro grossly intact    Lab Results: Basic Metabolic Panel:  Basename 07/18/11 0418 07/17/11 0352  NA 138 138  K 3.7 3.9  CL 97 97  CO2 32 32  GLUCOSE 107* 126*  BUN 7 10  CREATININE 0.85 0.89  CALCIUM 9.2 8.8  MG -- --  PHOS -- --   CBC:  Basename 07/18/11 0418 07/17/11 0352  WBC 17.2* 21.4*  NEUTROABS -- --  HGB 8.6* 8.6*  HCT 27.2* 27.1*  MCV 92.8 92.5  PLT 317 343    Assessment/Plan:  1) Postoperative atrial fibrillation - patient remains in sinus; Continue amiodarone 400 mg PO BID for 2 weeks, then 200 mg daily. If she maintains sinus, will DC amiodarone in 8-12 weeks. Continue IV heparin. Continue coumadin (goal INR 2-3). Continue toprol. 2) Postoperative volume excess - Continue lasix 40 mg po daily; follow renal function; still with significant pleural effusions on exam (probable contribution from pneumonia). Volume status should also improve now that sinus has been restored. 3) S/P AVR  4) Elevated WBC - chest CT with possible pneumonia;  Productive cough; Pulmonary has changed antibiotics. They continue to follow. 5) Elevated LFTs  6) Pulmonary embolus - Continue IV heparin and coumadin. Will overlap IV heparin x 48 hours. Watch INR closely given use of amiodarone and antibiotics. Lower ext dopplers neg.     Olga Millers 07/18/2011, 8:57 AM

## 2011-07-18 NOTE — Progress Notes (Signed)
TCTS BRIEF PROGRESS NOTE   Results of CTA and progress noted.  I have asked the nurses to apply Neosporin ointment to the upper portion of Terri Edwards's sternotomy incision where she has some minor skin edge necrosis and irritation from the underlying sutures.  I will be out of town the rest of this week.  Presuming that she goes home before next Monday I will plan to see her in the office for follow up in 2 or 3 weeks.  [name] H 07/18/2011 5:12 PM

## 2011-07-18 NOTE — Progress Notes (Addendum)
ANTICOAGULATION CONSULT NOTE - Follow Up Consult  Pharmacy Consult for Heparin.  MD dosing Warfarin  Indication: atrial fibrillation   Assessment/Plan: 61yo female  on heparin for new-onset symptomatic Afib with RVR. Heparin therapeutic this AM at 0.65.  No bleeding noted.  Her CBC remains stable.  INR 2.15 - MD following warfarin.  Has standing order for 5mg  daily.  CBC remains stable.  She has the following noted drug/drug interaction medication potential:  Amiodarone + Warfarin =  ? INR -Moderate effect observed when amiodarone and warfarin are coadministered is attributable to inhibition of P4502C9, the isozyme of P-450  Tramadol + Warfarin = Tramadol ? INR -Moderate - (possible inhibition of CYP3A4-mediated warfarin metabolism) Monitor INR when starting or stopping tramadol; dose reductions of 25%-30% may be required; AMS considers empiric 0%-20% warfarin dose reduction  References:  C P J / R P C . J A N UA R Y / F E B RUA R Y 2 0 1 1 . VO L 1 4 4  , N O 1 Drug interactions involving warfarin: Practice tool and practical management tips Tammy Conley Canal, BSP, PharmD; Tori Milks, BSP, ACPR; Lorelle Gibbs, BScPharm, PharmD, FCSHP; Brantley Stage, PharmD, ACPR  Plan: 1).  Continue IV heparin at 1450 units/hr 2).  F/U AM labs and adjust as needed. 3).  F/U plans for ongoing Heparin needs 4).  Follow Warfarin with MD and potential drug/drug interactions.  Celedonio Miyamoto, PharmD, BCPS Clinical Pharmacist Pager [phone]  Thank you for allowing pharmacy to be part of this patients care team. 07/18/2011   Labs:  Basename 07/18/11 0418 07/17/11 0352 07/16/11 0405 07/16/11 0015  HGB 8.6* 8.6* -- --  HCT 27.2* 27.1* 27.1* --  PLT 317 343 341 --  APTT -- -- -- --  LABPROT 24.4* 16.4* -- 14.6  INR 2.15* 1.30 -- 1.12  HEPARINUNFRC 0.65 0.34 0.40 --  CREATININE 0.85 0.89 0.88 --  CKTOTAL -- -- -- --  CKMB -- -- -- --  TROPONINI -- -- -- --   Addendum:  Called Dr. Jens Som.   Orders received for pharmacy to dose warfarin.  Will give 2.5mg  x 1 tonight.  Daily INR.

## 2011-07-18 NOTE — Progress Notes (Signed)
UR Completed/ UPDATED Simmons, Rodney Wigger F [phone]  

## 2011-07-18 NOTE — Progress Notes (Addendum)
HPI:  61 year old female with PMH of AVR on 4/15 who presents to the hospital with SOB, noted to be in a-fib with RVR and a CTA was ordered. CTA revealed by lateral PEs. Patient was also noted to have bilateral pulmonary infiltrate and elevation in here WBC. Denied any fever, chills, N/V but did report expectorating brown sputum with no blood in it. She also did report bilateral lower extremities edema. No previous personal or family history of blood clots.  Antibiotics:   PIV  Cultures/Sepsis Markers:   Blood 4/29>>> NTD Urine 4/29>>> NTD Sputum 4/29>>> NTD  Access/Protocols:  Vanc 4/29>>>  Cefepime 4/29>>>  Best Practice: DVT: Full dose heparin GI: Protonix  Subjective: Appears well, minimal O2 demand.  Physical Exam: Filed Vitals:   07/18/11 1029  BP: 110/55  Pulse: 86  Temp:   Resp:    Intake/Output Summary (Last 24 hours) at 07/18/11 1049 Last data filed at 07/18/11 1031  Gross per 24 hour  Intake    219 ml  Output   2000 ml  Net  -1781 ml   Vent Mode:  [-]  FiO2 (%):  [2 %] 2 %  Neuro: Alert and oriented, moving all ext to commands. Cardiac: RRR, Nl S1/S2, -M/R/G. Pulmonary: Bibasilar crackles. GI: Soft, NT, ND and +BS. Extremities: 1+ edema bilaterally.  Labs: CBC    Component Value Date/Time   WBC 17.2* 07/18/2011 0418   RBC 2.93* 07/18/2011 0418   HGB 8.6* 07/18/2011 0418   HCT 27.2* 07/18/2011 0418   PLT 317 07/18/2011 0418   MCV 92.8 07/18/2011 0418   MCH 29.4 07/18/2011 0418   MCHC 31.6 07/18/2011 0418   RDW 14.7 07/18/2011 0418   LYMPHSABS 3.9 07/13/2011 1422   MONOABS 1.4* 07/13/2011 1422   EOSABS 0.0 07/13/2011 1422   BASOSABS 0.0 07/13/2011 1422   BMET    Component Value Date/Time   NA 138 07/18/2011 0418   K 3.7 07/18/2011 0418   CL 97 07/18/2011 0418   CO2 32 07/18/2011 0418   GLUCOSE 107* 07/18/2011 0418   BUN 7 07/18/2011 0418   CREATININE 0.85 07/18/2011 0418   CALCIUM 9.2 07/18/2011 0418   GFRNONAA 72* 07/18/2011 0418   GFRAA 84* 07/18/2011  0418   ABG    Component Value Date/Time   PHART 7.300* 07/05/2011 1932   PCO2ART 48.4* 07/05/2011 1932   PO2ART 112.0* 07/05/2011 1932   HCO3 23.9 07/05/2011 1932   TCO2 23 07/06/2011 1650   ACIDBASEDEF 3.0* 07/05/2011 1932   O2SAT 98.0 07/05/2011 1932    Lab 07/13/11 1422  MG 1.9   Lab Results  Component Value Date   CALCIUM 9.2 07/18/2011   Chest Xray: Bibasilar infiltrate.  Assessment & Plan: 61 year old female with new onset provoked PE and diffuse pulmonary infiltrate. No evidence of hemoptysis to suggest acute severe pulmonary infarcts at this time. WBC makes PNA much more likely.   1. PE: provoked after a period of immobility following AVR.   Plan:  Anticoagulation with heparin, transition to coumadin.    Lower ext dopplers dopplers negative, no need for filter.    Transition to coumadin for 6-9 months depending on symptoms.    Titrate O2, will need an ambulatory desaturation study on room air if sat >= 88% will need home O2, will order.   IS per RT protocol.    Will need hypercoagulable work up 2 wks post completion of anticoagulation course.   2. Leukocytosis/pulmonary infiltrate: unlikely to be true  pulmonary infarcts but can not be ruled out. Patient had leukocytosis upon presentation before the surgery.   Plan:  D/Ced levaquin.    Pan culture NTD.    Vanc/cefepime until tomorrow then will transition to ampicillin depending on cultures for two days, if remains afebrile and WBC is improving then would send home on ampicillin for total abx days of 8 days.   3. Pleural effusion: ? Related to cardiac disorder.   Plan:  Diureses per cards.    F/U imaging.   4. Smoking: quit 3 wks ago after 64 pack years of smoking, no indication for treatment as a COPD exacerbation however.  Koren Bound, MD [phone]

## 2011-07-19 LAB — COMPREHENSIVE METABOLIC PANEL
Albumin: 2.7 g/dL — ABNORMAL LOW (ref 3.5–5.2)
Alkaline Phosphatase: 113 U/L (ref 39–117)
BUN: 9 mg/dL (ref 6–23)
Calcium: 8.9 mg/dL (ref 8.4–10.5)
Creatinine, Ser: 0.91 mg/dL (ref 0.50–1.10)
Potassium: 3.3 mEq/L — ABNORMAL LOW (ref 3.5–5.1)
Total Protein: 6.1 g/dL (ref 6.0–8.3)

## 2011-07-19 LAB — CBC
Hemoglobin: 8.2 g/dL — ABNORMAL LOW (ref 12.0–15.0)
MCH: 29.6 pg (ref 26.0–34.0)
MCHC: 32 g/dL (ref 30.0–36.0)
Platelets: 359 10*3/uL (ref 150–400)

## 2011-07-19 LAB — PROTIME-INR: Prothrombin Time: 28.3 seconds — ABNORMAL HIGH (ref 11.6–15.2)

## 2011-07-19 LAB — HEPARIN LEVEL (UNFRACTIONATED): Heparin Unfractionated: 0.5 IU/mL (ref 0.30–0.70)

## 2011-07-19 MED ORDER — POTASSIUM CHLORIDE 10 MEQ/100ML IV SOLN
10.0000 meq | INTRAVENOUS | Status: AC
  Administered 2011-07-19 (×2): 10 meq via INTRAVENOUS
  Filled 2011-07-19: qty 200
  Filled 2011-07-19 (×2): qty 100

## 2011-07-19 MED ORDER — POTASSIUM CHLORIDE CRYS ER 20 MEQ PO TBCR: 40.0000 meq | EXTENDED_RELEASE_TABLET | Freq: Two times a day (BID) | ORAL | Status: DC

## 2011-07-19 MED ORDER — WARFARIN SODIUM 2.5 MG PO TABS
2.5000 mg | ORAL_TABLET | Freq: Once | ORAL | Status: AC
  Administered 2011-07-19: 2.5 mg via ORAL
  Filled 2011-07-19: qty 1

## 2011-07-19 MED ORDER — POTASSIUM CHLORIDE CRYS ER 20 MEQ PO TBCR
40.0000 meq | EXTENDED_RELEASE_TABLET | Freq: Two times a day (BID) | ORAL | Status: DC
  Administered 2011-07-19 – 2011-07-20 (×4): 40 meq via ORAL
  Filled 2011-07-19 (×2): qty 2
  Filled 2011-07-19: qty 1
  Filled 2011-07-19 (×3): qty 2

## 2011-07-19 NOTE — Progress Notes (Signed)
ANTICOAGULATION CONSULT NOTE - Follow Up Consult  Pharmacy Consult for Heparin.  MD dosing Warfarin  Indication: atrial fibrillation/h/o PE   Assessment/Plan: 61yo female  on heparin for new-onset symptomatic Afib with RVR. Heparin has been dced this AM. Noted she has also been dx with a PE on 4/29. Her CBC remains stable.  INR 2.6. CBC remains stable.  She has the following noted drug/drug interaction medication potential:  Amiodarone + Warfarin =  ? INR -Moderate effect observed when amiodarone and warfarin are coadministered is attributable to inhibition of P4502C9, the isozyme of P-450  Tramadol + Warfarin = Tramadol ? INR -Moderate - (possible inhibition of CYP3A4-mediated warfarin metabolism) Monitor INR when starting or stopping tramadol; dose reductions of 25%-30% may be required; AMS considers empiric 0%-20% warfarin dose reduction  References:  C P J / R P C . J A N UA R Y / F E B RUA R Y 2 0 1 1 . VO L 1 4 4  , N O 1 Drug interactions involving warfarin: Practice tool and practical management tips Tammy Conley Canal, BSP, PharmD; Tori Milks, BSP, ACPR; Lorelle Gibbs, BScPharm, PharmD, FCSHP; Brantley Stage, PharmD, ACPR  Plan: 1).  Dc heparin per MD 2).  Coumadin 2.5mg  PO x1  Labs:  [facility] 07/19/11 0428 07/18/11 0418 07/17/11 0352  HGB 8.2* 8.6* --  HCT 25.6* 27.2* 27.1*  PLT 359 317 343  APTT -- -- --  LABPROT 28.3* 24.4* 16.4*  INR 2.60* 2.15* 1.30  HEPARINUNFRC 0.50 0.65 0.34  CREATININE 0.91 0.85 0.89  CKTOTAL -- -- --  CKMB -- -- --  TROPONINI -- -- --

## 2011-07-19 NOTE — Progress Notes (Signed)
Vision set up on 7 cmH20 of CPAP  and 30% and is on standby.  Patient does not want to go on CPAP at this time.  Patient states that she intends to stay up  stay awake for now.  RT will continue to monitor.

## 2011-07-19 NOTE — Progress Notes (Signed)
eLink Physician-Brief Progress Note Patient Name: Terri Edwards DOB: 01/28/1951 MRN: [id number]  Date of Service  07/19/2011   HPI/Events of Note   Multiple prolonged episodes of hypoxemia this evening (as low as 58%), all of which associated with good waveform on pulse oximetry and episodes of apnea witnessed by me through camera. All resolve abruptly when apnea resolves (either spontaneously or with awakening).  eICU Interventions  Start CPAP now while sleeping only, can come off when awake. Needs outpatient sleep study.   Intervention Category Major Interventions: Hypoxemia - evaluation and management  [name] 07/19/2011, 5:23 AM

## 2011-07-19 NOTE — Progress Notes (Signed)
eLink Physician-Brief Progress Note Patient Name: Terri Edwards DOB: 1951/01/11 MRN: [id number]  Date of Service  07/19/2011   HPI/Events of Note     eICU Interventions  Hypokalemia, repleted    Intervention Category Intermediate Interventions: Electrolyte abnormality - evaluation and management  [name] 07/19/2011, 5:51 AM

## 2011-07-19 NOTE — Progress Notes (Signed)
Patient refused cpap for tonight. RT will continue to monitor. 

## 2011-07-19 NOTE — Progress Notes (Signed)
Patient has 2 IV access, one on the right hand that is due to be changed and the other site to RAFA inserted on 07/18/11 with good blood return but site is slightly red and tender after 1 KCl run was given.  2nd run of KCl infusing to RH at a very slow rate (25 ml/hr, instead of 100 ml/hr).  Patient c/o burning sensation to site.  I tired to infused KCl at 50, but pt was teary and c/o burning sensation.  Report given to receiving nurse, Bosie Clos, RN in [phone].

## 2011-07-19 NOTE — Progress Notes (Signed)
@   Subjective:  No Chest pain; mild dyspnea with exertion; no cough   Objective:  Filed Vitals:   07/18/11 2139 07/18/11 2359 07/19/11 0423 07/19/11 0500  BP: 124/51 126/53 120/59   Pulse:   79   Temp:  98.6 F (37 C) 98.1 F (36.7 C)   TempSrc:  Oral Oral   Resp:  20 18   Height:      Weight:    84.6 kg (186 lb 8.2 oz)  SpO2:  92% 94%     Intake/Output from previous day:  Intake/Output Summary (Last 24 hours) at 07/19/11 0738 Last data filed at 07/19/11 0700  Gross per 24 hour  Intake 1037.5 ml  Output   2000 ml  Net -962.5 ml    Physical Exam: Physical exam: Well-developed well-nourished in no acute distress.  Skin is warm and dry.  HEENT is normal.  Neck is supple.  Chest with diminished BS bases (L>R), improved compared to previous; sternotomy with mild erythema at superior aspect Cardiovascular exam is regular rate and rhythm. 2/6 systolic murmur; no diastolic murmur. Abdominal exam nontender or distended. No masses palpated. Extremities show trace-1+ edema. neuro grossly intact    Lab Results: Basic Metabolic Panel:  Basename 07/19/11 0428 07/18/11 0418  NA 139 138  K 3.3* 3.7  CL 98 97  CO2 32 32  GLUCOSE 130* 107*  BUN 9 7  CREATININE 0.91 0.85  CALCIUM 8.9 9.2  MG -- --  PHOS -- --   CBC:  Basename 07/19/11 0428 07/18/11 0418  WBC 14.3* 17.2*  NEUTROABS -- --  HGB 8.2* 8.6*  HCT 25.6* 27.2*  MCV 92.4 92.8  PLT 359 317    Assessment/Plan:  1) Postoperative atrial fibrillation - patient remains in sinus; Continue amiodarone 400 mg PO BID for 2 weeks, then 200 mg daily. If she maintains sinus, will DC amiodarone in 8-12 weeks. Discontinue IV heparin. Continue coumadin (goal INR 2-3). Continue toprol. 2) Postoperative volume excess - Continue lasix 40 mg po daily; follow renal function; still with some volume excess; supplement K. 3) S/P AVR  4) Elevated WBC - chest CT with possible pneumonia; Productive cough; Pulmonary has changed  antibiotics. They continue to follow. Need input concerning duration of antibiotics. 5) Elevated LFTs - improved 6) Pulmonary embolus - Continue coumadin. DC IV heparin. Watch INR closely given use of amiodarone and antibiotics. Lower ext dopplers neg. 7) Possible sleep apnea - Desat noted last PM; CPAP initiated by pulmonary; will need outpt sleep study.   Transfer to telemetry  Olga Millers 07/19/2011, 7:38 AM

## 2011-07-19 NOTE — Progress Notes (Signed)
HPI:  61 year old female with PMH of AVR on 4/15 who presents to the hospital with SOB, noted to be in a-fib with RVR and a CTA was ordered. CTA revealed by lateral PEs. Patient was also noted to have bilateral pulmonary infiltrate and elevation in here WBC. Denied any fever, chills, N/V but did report expectorating brown sputum with no blood in it. She also did report bilateral lower extremities edema. No previous personal or family history of blood clots.  Antibiotics:   PIV  Cultures/Sepsis Markers:   Blood 4/29>>> NTD Urine 4/29>>> NTD Sputum 4/29>>> NTD  Access/Protocols:  Vanc 4/29>>>  Cefepime 4/29>>>  Best Practice: DVT: Full dose heparin GI: Protonix  Subjective: Appears well, minimal O2 demand.  Physical Exam: Filed Vitals:   07/19/11 0822  BP: 118/58  Pulse: 76  Temp: 98.1 F (36.7 C)  Resp: 18    Intake/Output Summary (Last 24 hours) at 07/19/11 1008 Last data filed at 07/19/11 0900  Gross per 24 hour  Intake 1483.5 ml  Output   1200 ml  Net  283.5 ml      Neuro: Alert and oriented, moving all ext to commands. Cardiac: RRR, Nl S1/S2, -M/R/G. Pulmonary: Bibasilar crackles. GI: Soft, NT, ND and +BS. Extremities: 1+ edema bilaterally.  Labs: CBC    Component Value Date/Time   WBC 14.3* 07/19/2011 0428   RBC 2.77* 07/19/2011 0428   HGB 8.2* 07/19/2011 0428   HCT 25.6* 07/19/2011 0428   PLT 359 07/19/2011 0428   MCV 92.4 07/19/2011 0428   MCH 29.6 07/19/2011 0428   MCHC 32.0 07/19/2011 0428   RDW 14.7 07/19/2011 0428   LYMPHSABS 3.9 07/13/2011 1422   MONOABS 1.4* 07/13/2011 1422   EOSABS 0.0 07/13/2011 1422   BASOSABS 0.0 07/13/2011 1422   BMET    Component Value Date/Time   NA 139 07/19/2011 0428   K 3.3* 07/19/2011 0428   CL 98 07/19/2011 0428   CO2 32 07/19/2011 0428   GLUCOSE 130* 07/19/2011 0428   BUN 9 07/19/2011 0428   CREATININE 0.91 07/19/2011 0428   CALCIUM 8.9 07/19/2011 0428   GFRNONAA 67* 07/19/2011 0428   GFRAA 77* 07/19/2011 0428   ABG    Component Value  Date/Time   PHART 7.300* 07/05/2011 1932   PCO2ART 48.4* 07/05/2011 1932   PO2ART 112.0* 07/05/2011 1932   HCO3 23.9 07/05/2011 1932   TCO2 23 07/06/2011 1650   ACIDBASEDEF 3.0* 07/05/2011 1932   O2SAT 98.0 07/05/2011 1932    Lab 07/13/11 1422  MG 1.9   Lab Results  Component Value Date   CALCIUM 8.9 07/19/2011   Ct Angio Chest W/cm &/or Wo Cm  07/17/2011  *RADIOLOGY REPORT*  Clinical Data: Shortness of breath.  Evaluate for pulmonary embolism.  CT ANGIOGRAPHY CHEST  Technique:  Multidetector CT imaging of the chest using the standard protocol during bolus administration of intravenous contrast. Multiplanar reconstructed images including MIPs were obtained and reviewed to evaluate the vascular anatomy.  Contrast: 86mL OMNIPAQUE IOHEXOL 350 MG/ML SOLN  Comparison: Chest CT 04/21/2011.  Findings:  Mediastinum: There are numerous filling defects within the pulmonary artery branches to the lungs bilaterally, the largest burden is in the lower lobes, particularly on the left where there are both segmental and subsegmental sized filling defects, compatible with pulmonary embolism.  Some of these appear to be occlusive. Heart size is mildly enlarged. There is a small amount of pericardial fluid and/or thickening anteriorly, likely postoperative given the recent history of sternotomy.  The stented bioprosthesis is noted in the aortic valve position. No acute abnormality of the thoracic aorta; specifically, no aneurysm or dissection.  Numerous borderline enlarged and mildly enlarged mediastinal lymph nodes are noted, including several in the subcarinal station, presumably reactive.There is a small hiatal hernia.  Lungs/Pleura: There are areas of atelectasis in the lower lobes of the lungs bilaterally, some of which demonstrate little to no enhancement, most compatible with areas of hypoperfusion or frank infarction.  There are small bilateral pleural effusions layering dependently.  Throughout the lungs bilaterally  (predominantly in a dependent distribution) there are patchy areas of peribronchovascular micronodularity and ground glass attenuation, favored to represent an endobronchial infection.  No larger more suspicious appearing pulmonary nodules or masses are otherwise identified.  Upper Abdomen: Unremarkable.  Musculoskeletal: Status post median sternotomy. There are no aggressive appearing lytic or blastic lesions noted in the visualized portions of the skeleton.  IMPRESSION: 1.  Study is positive for pulmonary embolism involving the segmental and subsegmental sized branches to the lungs bilaterally, predominately within the lower lobes.  In addition, there are areas of profound hyperperfusion and/or frank infarction in the dependent lower lobes bilaterally. 2.  At this time, there is no evidence of pulmonary arterial hypertension or right-sided heart strain. 3. In addition, there is a pattern of patchy predominantly peribronchovascular micronodularity and ground-glass attenuation. The appearance is nonspecific, but suggest an endobronchial infection (i.e., bronchopneumonia).  Alternatively, in the setting of pulmonary infarctions, if the patient has a history of hemoptysis this could reflect reactive changes in the lungs from the endobronchial spread of hemorrhage.  There are also small bilateral pleural effusions layering dependently. 4.  Cardiomegaly with a small amount of anterior pericardial fluid/thickening, which is to be expected given the patient's history of recent aortic valve replacement.  The stented bio prosthesis is in place in the aortic valve position. 5.  Small hiatal hernia.  Critical Value/emergent results were called by telephone at the time of interpretation on 07/17/2011  at 01:05 p.m.  to  Dr. Jens Som, who verbally acknowledged these results.  Original Report Authenticated By: Florencia Reasons, M.D.     Assessment & Plan: 61 year old female with new onset provoked PE and diffuse pulmonary  infiltrate. No evidence of hemoptysis to suggest acute severe pulmonary infarcts at this time. WBC makes PNA much more likely.   1. PE: provoked after a period of immobility following AVR.   Plan:  Anticoagulation with heparin, transition to coumadin.    Lower ext dopplers dopplers negative, no need for filter.    Transition to coumadin for 6-9 months depending on symptoms.    Titrate O2, will need an ambulatory desaturation study on room air if sat >= 88% will need home O2, will order.   IS per RT protocol.    Will need hypercoagulable work up 2 wks post completion of anticoagulation course.   2. Leukocytosis/pulmonary infiltrate: unlikely to be true pulmonary infarcts but can not be ruled out. Patient had leukocytosis upon presentation before the surgery.   Plan:  D/Ced levaquin.    Pan culture NTD.    Vanc/cefepime until tomorrow then will transition to ampicillin depending on cultures for two days, if remains afebrile and WBC is improving then would send home on ampicillin for total abx days of 8 days.   3. Pleural effusion: ? Related to cardiac disorder.   Plan:  Diureses per cards.    F/U imaging.   4. Smoking: quit 3 wks ago after  64 pack years of smoking, no indication for treatment as a COPD exacerbation however.  5. Needs outpatient sleep study.   Brett Canales Minor ACNP [facility] Pager [phone] till 3 pm If no answer page [phone] 07/19/2011, 10:10 AM  Recommend completion of today as IV abx then change to ampicillin in AM for 48 hours.  If remains afebrile and WBC stable then may D/C on ampicillin for total abx days of 8 (three more days if patient is discharged in 48 hours).  Will also require anticoagulation for 6-9 months for PE (a-fib is a different issue, per cards) and will need F/U as outpatient with  PCCM for PE and for a sleep study (needs to be arranged prior to discharge).  Patient seen and examined, agree with above note.  I dictated the care and orders  written for this patient under my direction.  Koren Bound, M.D. [phone]

## 2011-07-20 ENCOUNTER — Inpatient Hospital Stay (HOSPITAL_COMMUNITY): Payer: BC Managed Care – PPO

## 2011-07-20 DIAGNOSIS — R918 Other nonspecific abnormal finding of lung field: Secondary | ICD-10-CM

## 2011-07-20 LAB — PROTIME-INR
INR: 2.38 — ABNORMAL HIGH (ref 0.00–1.49)
Prothrombin Time: 26.4 seconds — ABNORMAL HIGH (ref 11.6–15.2)

## 2011-07-20 LAB — BASIC METABOLIC PANEL
Chloride: 101 mEq/L (ref 96–112)
GFR calc Af Amer: 75 mL/min — ABNORMAL LOW (ref >90)
Potassium: 4.1 mEq/L (ref 3.5–5.1)
Sodium: 140 mEq/L (ref 135–145)

## 2011-07-20 LAB — VANCOMYCIN, TROUGH: Vancomycin Tr: 18.9 ug/mL (ref 10.0–20.0)

## 2011-07-20 LAB — CULTURE, RESPIRATORY W GRAM STAIN

## 2011-07-20 MED ORDER — AMOXICILLIN-POT CLAVULANATE 875-125 MG PO TABS
1.0000 | ORAL_TABLET | Freq: Two times a day (BID) | ORAL | Status: DC
  Filled 2011-07-20 (×4): qty 1

## 2011-07-20 MED ORDER — WARFARIN SODIUM 3 MG PO TABS
3.0000 mg | ORAL_TABLET | Freq: Once | ORAL | Status: AC
  Administered 2011-07-20: 3 mg via ORAL
  Filled 2011-07-20: qty 1

## 2011-07-20 MED ORDER — LEVOFLOXACIN 500 MG PO TABS
500.0000 mg | ORAL_TABLET | Freq: Every day | ORAL | Status: DC
  Administered 2011-07-20: 500 mg via ORAL
  Filled 2011-07-20 (×2): qty 1

## 2011-07-20 NOTE — Progress Notes (Signed)
HPI:  61 year old female with PMH of AVR on 4/15 who presents to the hospital with SOB, noted to be in a-fib with RVR and a CTA was ordered. CTA revealed by lateral PEs. Patient was also noted to have bilateral pulmonary infiltrate and elevation in here WBC. Denied any fever, chills, N/V but did report expectorating brown sputum with no blood in it. She also did report bilateral lower extremities edema. No previous personal or family history of blood clots.  Antibiotics:   PIV  Cultures/Sepsis Markers:   Blood 4/29>>> NTD Urine 4/29>>> NTD Sputum 4/29>>> NTD  Access/Protocols:  Vanc 4/29>>> 5/2 Cefepime 4/29>>>5/2 5/2 Augmentin>>  Best Practice: DVT: coumadin GI: Protonix  Subjective: Appears well, ambulated on room air with sats >94%.  Physical Exam:  Intake/Output Summary (Last 24 hours) at 07/20/11 1110 Last data filed at 07/20/11 0852  Gross per 24 hour  Intake    363 ml  Output      0 ml  Net    363 ml     Patient Vitals for the past 24 hrs:  BP Temp Temp src Pulse Resp SpO2 Weight  07/20/11 0926 113/68 mmHg - - 88  - - -  07/20/11 0500 136/79 mmHg 98.1 F (36.7 C) Oral 78  18  94 % 187 lb 1.6 oz (84.868 kg)  07/19/11 2100 137/74 mmHg 98.9 F (37.2 C) Oral 87  18  95 % -  07/19/11 1130 123/71 mmHg 98 F (36.7 C) - 79  18  92 % -    Neuro: Alert and oriented, moving all ext to commands. Cardiac: RRR, Nl S1/S2, -M/R/G. Pulmonary: Bibasilar crackles. GI: Soft, NT, ND and +BS. Extremities: 1+ edema bilaterally.  Labs:  Lab 07/20/11 0540 07/19/11 0428 07/18/11 0418  NA 140 139 138  K 4.1 3.3* 3.7  CL 101 98 97  CO2 31 32 32  BUN 8 9 7   CREATININE 0.93 0.91 0.85  GLUCOSE 114* 130* 107*    Lab 07/19/11 0428 07/18/11 0418 07/17/11 0352  HGB 8.2* 8.6* 8.6*  HCT 25.6* 27.2* 27.1*  WBC 14.3* 17.2* 21.4*  PLT 359 317 343       Assessment & Plan: 61 year old female with new onset provoked PE and diffuse pulmonary infiltrate. No evidence of hemoptysis to  suggest acute severe pulmonary infarcts at this time. WBC makes PNA much more likely.   1. PE: provoked after a period of immobility following AVR.   Plan:  Cont anticoagulation with coumadin for total of 6 months     Her RF is well defined (prolonged hospitalization) and therefore, she should not require a hypercoag w/u.   2. Leukocytosis/pulmonary infiltrate: There is little evidence to support a dx of active bacterial process. Would complete 7 days abx (transitioned to Augmentin today - would continue through 5/5).    3. Pleural effusion: ? Related to cardiac disorder and or VTE  Plan:  Diuresis per cards.    PA/lat CXR to re-eval 5/2  4. Smoking: quit 3 wks ago after 64 pack years of smoking, no indication for treatment as a COPD exacerbation however.  5. Consider outpatient sleep study.    Brett Canales Minor ACNP [facility] Pager [phone] till 3 pm If no answer page [phone] 07/20/2011, 11:10 AM    Seen on rounds this morning with resident MD or ACNP above.  Pt examined and database reviewed. I agree with above findings, assessment and plan as reflected in the note above.  Billy Fischeravid Essynce Munsch, MD;  PCCM service; Mobile [phone]

## 2011-07-20 NOTE — Progress Notes (Signed)
ANTICOAGULATION CONSULT NOTE - Follow Up Consult  Pharmacy Consult for Coumadin Indication: pulmonary embolus  Allergies  Allergen Reactions  . Nickel     Rash blisters  . Sulfa Antibiotics     Blisters  Rash   . Tape     Slight skin irritation    Patient Measurements: Height: 5\' 2"  (157.5 cm) Weight: 187 lb 1.6 oz (84.868 kg) IBW/kg (Calculated) : 50.1   Vital Signs: Temp: 98.1 F (36.7 C) (05/02 0500) Temp src: Oral (05/02 0500) BP: 113/68 mmHg (05/02 0926) Pulse Rate: 88  (05/02 0926)  Labs:  Basename 07/20/11 0540 07/19/11 0428 07/18/11 0418  HGB -- 8.2* 8.6*  HCT -- 25.6* 27.2*  PLT -- 359 317  APTT -- -- --  LABPROT 26.4* 28.3* 24.4*  INR 2.38* 2.60* 2.15*  HEPARINUNFRC -- 0.50 0.65  CREATININE 0.93 0.91 0.85  CKTOTAL -- -- --  CKMB -- -- --  TROPONINI -- -- --   Estimated Creatinine Clearance: 64.2 ml/min (by C-G formula based on Cr of 0.93).   Medications:  Scheduled:    . amiodarone  400 mg Oral BID  . ceFEPime (MAXIPIME) IV  1 g Intravenous Q12H  . furosemide  40 mg Oral Daily  . metoprolol succinate  25 mg Oral Daily  . neomycin-bacitracin-polymyxin   Topical Daily  . pantoprazole  40 mg Oral Q1200  . potassium chloride  10 mEq Intravenous Q1 Hr x 4  . potassium chloride  40 mEq Oral BID  . sodium chloride  3 mL Intravenous Q12H  . vancomycin  1,000 mg Intravenous Q12H  . warfarin  2.5 mg Oral ONCE-1800  . Warfarin - Pharmacist Dosing Inpatient   Does not apply q1800  . DISCONTD: potassium chloride  20 mEq Oral Daily  . DISCONTD: potassium chloride  40 mEq Oral BID    Assessment: 61yo female with new-onset AFib and PE (CT on 4/29).  Heparin d/c'd after INR therapeutic x 48hr.  INR 2.38 this AM, down some but in all likelihood reflecting correction from bump to 2.6 after 5mg  dose.  No bleeding problems noted.  Possible increased Coumadin effect from Amiodarone & Tramadol, watch INR with dose adjustment or d/c of these interacting  medications.  Goal of Therapy:  INR 2-3   Plan:  1.  Coumadin 3mg  today 2.  F/U in AM  Marisue Humble, PharmD Clinical Pharmacist [facility]

## 2011-07-20 NOTE — Progress Notes (Signed)
Pt refused Augmentin, she stated it causes diarrhea. Brett Canales Minor, NP notified, stated he will start something else.  Med documented as not given

## 2011-07-20 NOTE — Progress Notes (Signed)
@   Subjective:  Dyspnea and cough continue to slowly improve; chest "sore".   Objective:  Filed Vitals:   07/19/11 1021 07/19/11 1130 07/19/11 2100 07/20/11 0500  BP: 118/58 123/71 137/74 136/79  Pulse: 76 79 87 78  Temp:  98 F (36.7 C) 98.9 F (37.2 C) 98.1 F (36.7 C)  TempSrc:   Oral Oral  Resp:  18 18 18   Height:      Weight:    84.868 kg (187 lb 1.6 oz)  SpO2:  92% 95% 94%    Intake/Output from previous day:  Intake/Output Summary (Last 24 hours) at 07/20/11 0719 Last data filed at 07/19/11 1155  Gross per 24 hour  Intake    493 ml  Output      0 ml  Net    493 ml    Physical Exam: Physical exam: Well-developed well-nourished in no acute distress.  Skin is warm and dry.  HEENT is normal.  Neck is supple.  Chest with diminished BS bases, improved compared to previous; sternotomy with mild erythema at superior aspect (improving) Cardiovascular exam is regular rate and rhythm. 2/6 systolic murmur; no diastolic murmur. Abdominal exam nontender or distended. No masses palpated. Extremities show trace edema. neuro grossly intact    Lab Results: Basic Metabolic Panel:  Basename 07/20/11 0540 07/19/11 0428  NA 140 139  K 4.1 3.3*  CL 101 98  CO2 31 32  GLUCOSE 114* 130*  BUN 8 9  CREATININE 0.93 0.91  CALCIUM 9.2 8.9  MG -- --  PHOS -- --   CBC:  Basename 07/19/11 0428 07/18/11 0418  WBC 14.3* 17.2*  NEUTROABS -- --  HGB 8.2* 8.6*  HCT 25.6* 27.2*  MCV 92.4 92.8  PLT 359 317    Assessment/Plan:  1) Postoperative atrial fibrillation - patient remains in sinus; Continue amiodarone 400 mg PO BID; will DC on 200 mg daily. If she maintains sinus, will DC amiodarone in 8-12 weeks. Continue coumadin (goal INR 2-3). Continue toprol. 2) Postoperative volume excess - Continue lasix 40 mg po daily; follow renal function; volume status improving; follow K. 3) S/P AVR  4) Elevated WBC - chest CT with possible pneumonia; Productive cough improving; Pulmonary  is managing antibiotics. They continue to follow. Plans to change to po ampicillin today noted. 5) Elevated LFTs - improved 6) Pulmonary embolus - Continue coumadin. Watch INR closely given use of amiodarone and antibiotics. Lower ext dopplers neg. Duration of coumadin for 6-9 months. 7) Possible sleep apnea - Desat previously noted; CPAP initiated by pulmonary; will need outpt sleep study.  Olga Millers 07/20/2011, 7:19 AM

## 2011-07-20 NOTE — Progress Notes (Signed)
Heart Failure packet and teaching reviewed with patient will continue to reinforce.

## 2011-07-21 LAB — BASIC METABOLIC PANEL
Calcium: 9.1 mg/dL (ref 8.4–10.5)
Creatinine, Ser: 0.9 mg/dL (ref 0.50–1.10)
GFR calc non Af Amer: 68 mL/min — ABNORMAL LOW (ref >90)
Sodium: 139 mEq/L (ref 135–145)

## 2011-07-21 LAB — PROTIME-INR
INR: 2.87 — ABNORMAL HIGH (ref 0.00–1.49)
Prothrombin Time: 30.5 seconds — ABNORMAL HIGH (ref 11.6–15.2)

## 2011-07-21 MED ORDER — FUROSEMIDE 40 MG PO TABS: 40.0000 mg | ORAL_TABLET | Freq: Every day | ORAL | Status: DC

## 2011-07-21 MED ORDER — BACITRACIN-NEOMYCIN-POLYMYXIN OINTMENT TUBE: 1.0000 "application " | TOPICAL_OINTMENT | Freq: Every day | CUTANEOUS | Status: DC

## 2011-07-21 MED ORDER — WARFARIN SODIUM 2.5 MG PO TABS
2.5000 mg | ORAL_TABLET | Freq: Every day | ORAL | Status: DC
  Filled 2011-07-21: qty 1

## 2011-07-21 MED ORDER — POTASSIUM CHLORIDE CRYS ER 20 MEQ PO TBCR
20.0000 meq | EXTENDED_RELEASE_TABLET | Freq: Every day | ORAL | Status: DC
  Administered 2011-07-21: 20 meq via ORAL

## 2011-07-21 MED ORDER — WARFARIN SODIUM 2 MG PO TABS: 2.0000 mg | ORAL_TABLET | Freq: Every day | ORAL | Status: DC

## 2011-07-21 MED ORDER — POTASSIUM CHLORIDE CRYS ER 20 MEQ PO TBCR: 20.0000 meq | EXTENDED_RELEASE_TABLET | Freq: Every day | ORAL | Status: DC

## 2011-07-21 MED ORDER — AMIODARONE HCL 200 MG PO TABS
200.0000 mg | ORAL_TABLET | Freq: Every day | ORAL | Status: DC
  Administered 2011-07-21: 200 mg via ORAL
  Filled 2011-07-21: qty 1

## 2011-07-21 MED ORDER — LEVOFLOXACIN 500 MG PO TABS: 500.0000 mg | ORAL_TABLET | Freq: Every day | ORAL | Status: AC

## 2011-07-21 MED ORDER — AMIODARONE HCL 200 MG PO TABS: 200.0000 mg | ORAL_TABLET | Freq: Every day | ORAL | Status: DC

## 2011-07-21 NOTE — Progress Notes (Addendum)
ANTICOAGULATION CONSULT NOTE - Follow Up Consult  Pharmacy Consult for Coumadin Indication: pulmonary embolus - new A-Fib  Allergies  Allergen Reactions  . Nickel     Rash blisters  . Sulfa Antibiotics     Blisters  Rash   . Tape     Slight skin irritation    Patient Measurements: Height: 5\' 2"  (157.5 cm) Weight: 187 lb 1.6 oz (84.868 kg) IBW/kg (Calculated) : 50.1   Vital Signs: Temp: 98.4 F (36.9 C) (05/03 0500) Temp src: Oral (05/03 0500) BP: 131/66 mmHg (05/03 0926) Pulse Rate: 82  (05/03 0926)  Labs:  Basename 07/21/11 0510 07/20/11 0540 07/19/11 0428  HGB -- -- 8.2*  HCT -- -- 25.6*  PLT -- -- 359  APTT -- -- --  LABPROT 30.5* 26.4* 28.3*  INR 2.87* 2.38* 2.60*  HEPARINUNFRC -- -- 0.50  CREATININE 0.90 0.93 0.91  CKTOTAL -- -- --  CKMB -- -- --  TROPONINI -- -- --   Estimated Creatinine Clearance: 66.3 ml/min (by C-G formula based on Cr of 0.9).   Assessment: 61yo female s/p tissue AVR 4/17 admitted with SOB and new-onset AFib CVR on amiodarone -new - AST WNL ALT slightly elveated, TSH WNL.  Also found to have PE per (CT on 4/29).  Heparin d/c'd after INR therapeutic x 48hr and 5 day overlap  INR 2.87 this AM at goal 2-3.  No bleeding problems noted, h/h low but stable pltc wnl.  Possible increased Coumadin effect from Amiodarone / levofloaxacin watch INR with dose adjustment or d/c of these interacting medications.   Goal of Therapy:  INR 2-3   Plan:  1.  Coumadin 2.5mg  daily - may need increase after levofloxacin course finishes 2.  Daily INR

## 2011-07-21 NOTE — Discharge Summary (Signed)
Discharge Summary   Patient ID: Terri Edwards MRN: [id number], DOB/AGE: 26-May-1950 61 y.o.  Primary MD: Carylon Perches, MD Primary Cardiologist: Olga Millers MD  Admit date: 07/13/2011 D/C date:     07/21/2011      Primary Discharge Diagnoses:  1. Atrial Fibrillation w/ RVR, newly diagnosed this admission  - In the setting of post operative AVR and pulmonary embolism  - Converted to NSR on amiodarone; DC on 200mg  amio daily with plans to dc in 8-12wks if remains in NSR  2. Pulmonary Embolus  - Bilateral PE by CTA chest in the setting of immobility after AVR  - Coumadin for 6-80mos with close monitoring of INRs given that she is on amiodarone and antibiotics  3. Leukocytosis  - Urine/sputum/blood cultures without growth, CXR/CT with pulmonary infiltrates ?PNA  - DC on Levaquin, complete course on 5/5  4. Elevated LFTs  - Felt 2/2 hepatic congestion in the setting of volume overload, Improved  5. Possible Sleep Apnea  - Recommend outpatient sleep study and f/u with pulmonology  6. PostOp Volume Excess  - Improving with diuresis  - Discharge weight 187lbs (84.9kg)  7. Severe AS S/p Tissue AVR (07/05/11)  -  Echo 07/14/11 - mod LVH, normal LV systolic function, EF 55-60%, grade 2 diastolic dysfunction, AV valve with peak/mean gradients 35/43mmHg respectively, mod-severe LAE, and dilated RV/RA. S  Secondary Discharge Diagnoses:  1. HTN 2. HLD 3. Obesity 4. Bell's Palsy 5. GERD 6. Arthritis 7. Left knee arthroscopy 8. Bilateral Cataract Extraction 9. 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 Nickel Rash, blisters  . Sulfa Antibiotics Rash, blisters  . Tape Slight skin irritation    Diagnostic Studies/Procedures:   07/14/11 - 2D Echocardiogram Study Conclusions: - Left ventricle: The cavity size was normal. Wall thickness was increased in a pattern of moderate LVH. Systolic function was normal. The estimated ejection fraction  was in the range of 55% to 60%. Features are consistent with a pseudonormal left ventricular filling pattern, with concomitant abnormal relaxation and increased filling pressure (grade 2 diastolic dysfunction). - Aortic valve: AV prosthesis appears to open well. Peak and mean gradients through the valve are 35 and 21 mm Hg respectively.  - Left atrium: The atrium was moderately to severely dilated. - Right ventricle: The cavity size was mildly dilated. - Right atrium: The atrium was moderately dilated. - Pulmonary arteries: PA peak pressure: 32mm Hg (S).  07/17/2011 - CTA Chest  Findings:  Mediastinum: There are numerous filling defects within the pulmonary artery branches to the lungs bilaterally, the largest burden is in the lower lobes, particularly on the left where there are both segmental and subsegmental sized filling defects, compatible with pulmonary embolism.  Some of these appear to be occlusive. Heart size is mildly enlarged. There is a small amount of pericardial fluid and/or thickening anteriorly, likely postoperative given the recent history of sternotomy.  The stented bioprosthesis is noted in the aortic valve position. No acute abnormality of the thoracic aorta; specifically, no aneurysm or dissection.  Numerous borderline enlarged and mildly enlarged mediastinal lymph nodes are noted, including several in the subcarinal station, presumably reactive.There is a small hiatal hernia.  Lungs/Pleura: There are areas of atelectasis in the lower lobes of the lungs bilaterally, some of which demonstrate little to no enhancement, most compatible with areas of hypoperfusion or frank infarction.  There are small bilateral pleural effusions layering dependently.  Throughout the lungs bilaterally (predominantly in a dependent  distribution) there are patchy areas of peribronchovascular micronodularity and ground glass attenuation, favored to represent an endobronchial infection.  No larger more suspicious  appearing pulmonary nodules or masses are otherwise identified.  Upper Abdomen: Unremarkable.  Musculoskeletal: Status post median sternotomy. There are no aggressive appearing lytic or blastic lesions noted in the visualized portions of the skeleton.  IMPRESSION: 1.  Study is positive for pulmonary embolism involving the segmental and subsegmental sized branches to the lungs bilaterally, predominately within the lower lobes.  In addition, there are areas of profound hyperperfusion and/or frank infarction in the dependent lower lobes bilaterally. 2.  At this time, there is no evidence of pulmonary arterial hypertension or right-sided heart strain. 3. In addition, there is a pattern of patchy predominantly peribronchovascular micronodularity and ground-glass attenuation. The appearance is nonspecific, but suggest an endobronchial infection (i.e., bronchopneumonia).  Alternatively, in the setting of pulmonary infarctions, if the patient has a history of hemoptysis this could reflect reactive changes in the lungs from the endobronchial spread of hemorrhage.  There are also small bilateral pleural effusions layering dependently. 4.  Cardiomegaly with a small amount of anterior pericardial fluid/thickening, which is to be expected given the patient's history of recent aortic valve replacement.  The stented bio prosthesis is in place in the aortic valve position. 5.  Small hiatal hernia.  07/17/11 - BLE Dopplers Summary: No evidence of deep vein or superficial thrombosis involving the right lower extremity and left lower extremity. No evidence of Baker's cyst on the right or left.  History of Present Illness: 61 y.o. female w/ the above medical problems who was seen in clinic by Dr. Jens Som on 07/13/11 for hospital follow up of tissue AVR (07/05/11) and complained of sob and palpitations. She was found to be in A.Fib w/ RVR and subsequently admitted to [facility] for further evaluation and treatment.  Echo  in Jan 2013 showed normal LV function, severe AS with mean gradient of 50 mmHg, mild LAE. She had aortic valve replacement on April 17 with a pericardial tissue valve and was discharged on April 21. She began noticing mild palpitations the evening prior to presentation and increased shortness of breath. EKG in the office showed A.Fib 151bpm w/ inferolateral T wave changes. It was felt she needed admission for further evaluation and treatment of her dyspnea and atrial fibrillation.  Hospital Course: Upon arrival to [facility] she was more dyspneic and hypotensive requiring transfer to the stepdown unit. CXR showed bilateral pleural effusions that appeared unchanged from previous CXR. Bedside echo showed preserved LV function without pericardial effusion. Labs were significant for mildly elevated BUN/Crt, elevated LFTs, Troponin 0.38, and WBC 19.5. It was suspected her symptoms were related to a.fib w/ rvr and volume excess. She was afebrile, her sternotomy incision was without evidence of infection, no PNA on CXR, and UA normal. Elevated LFTs were felt secondary to hepatic congestion and improved with diuresis.   She was treated with IV heparin, IV amiodarone and given IV lasix for volume overload with close monitoring of BP, electrolytes, and renal function. Echo was completed on 07/14/11 showing mod LVH, normal LV systolic function, EF 55-60%, grade 2 diastolic dysfunction, AV valve with peak/mean gradients 35/36mmHg respectively, mod-severe LAE, and dilated RV/RA. She converted to NSR and amiodarone was transitioned to oral dosing, however, she reverted back to A. Fib and was continued on amiodarone and initiated on coumadin and digoxin. She again converted to NSR and digoxin was stopped. She remained in NSR throughout  the rest of her hospitalization. She will be discharged on 200mg  Amiodarone daily and if she remains in sinus will plan to discontinue in 8-12 weeks.  She continued to have significant pleural  effusions on exam as well as a productive cough for which Levaquin was initiated. Given the right sided heart enlargement, dyspnea, and elevated troponin a CTA chest was performed which revealed bilateral pulmonary emboli. BLE dopplers were performed without evidence of thrombosis. She was continued on IV heparin and coumadin until INR was therapeutic. She will remain on coumadin for 6-29mos with close monitoring of INRs given that she is on amiodarone and antibiotics. It was not felt she needed a hypercoagulable work up as her risk factor (prolonged immobility following AVR) was well defined. Pulmonary evaluated her for leukocytosis and pulmonary infiltrates and stopped her Levaquin, added vancomycin and cefepime and checked sputum/blood/urine cultures. All of the cultures were without growth and there was little evidence to support a diagnosis of active bacterial process. She was initially transitioned to Augmentin, but due to history of diarrhea was changed to Levaquin with plans to complete course through 5/5. She was noted to have prolonged episodes of hypoxemia and apnea while sleeping for which she was placed on CPAP. Recommendations were made for outpatient sleep study and follow up with pulmonology.  She was seen and evaluated by Dr. Jens Som who felt she was stable for discharge home with plans for follow up as scheduled below.  Discharge Vitals: Blood pressure 131/66, pulse 82, temperature 98.4 F (36.9 C), temperature source Oral, resp. rate 18, height 5\' 2"  (1.575 m), weight 187 lb 1.6 oz (84.868 kg), last menstrual period 04/29/1991, SpO2 92.00%.  Labs: Component Value Date   WBC 14.3* 07/19/2011   HGB 8.2* 07/19/2011   HCT 25.6* 07/19/2011   MCV 92.4 07/19/2011   PLT 359 07/19/2011     07/21/2011 05:10  Prothrombin Time 30.5 (H)  INR 2.87 (H)   Lab 07/21/11 0510 07/19/11 0428  NA 139 --  K 4.3 --  CL 101 --  CO2 30 --  BUN 7 --  CREATININE 0.90 --  CALCIUM 9.1 --  PROT -- 6.1  BILITOT  -- 0.3  ALKPHOS -- 113  ALT -- 57*  AST -- 22  GLUCOSE 99 --     07/13/2011 14:23  CK, MB 3.6  CK Total 133  Troponin I 0.38 (HH)     07/13/2011 16:57  TSH 2.737    Discharge Medications   Medication List  As of 07/21/2011 11:06 AM   STOP taking these medications         aspirin 325 MG EC tablet      traMADol 50 MG tablet         TAKE these medications         amiodarone 200 MG tablet   Commonly known as: PACERONE   Take 1 tablet (200 mg total) by mouth daily.      furosemide 40 MG tablet   Commonly known as: LASIX   Take 1 tablet (40 mg total) by mouth daily.      levofloxacin 500 MG tablet   Commonly known as: LEVAQUIN   Take 1 tablet (500 mg total) by mouth daily at 6 PM.      menthol-cetylpyridinium 3 MG lozenge   Commonly known as: CEPACOL   Take 1 lozenge (3 mg total) by mouth as needed.      metoprolol succinate 25 MG 24 hr tablet   Commonly known  as: TOPROL-XL   Take 1 tablet (25 mg total) by mouth daily.      neomycin-bacitracin-polymyxin Oint   Commonly known as: NEOSPORIN   Apply 1 application topically daily. Apply to sternal incision      pantoprazole 40 MG tablet   Commonly known as: PROTONIX   Take 40 mg by mouth daily as needed. For reflux      potassium chloride SA 20 MEQ tablet   Commonly known as: K-DUR,KLOR-CON   Take 1 tablet (20 mEq total) by mouth daily.      pravastatin 40 MG tablet   Commonly known as: PRAVACHOL   Take 40 mg by mouth daily.      warfarin 2 MG tablet   Commonly known as: COUMADIN   Take 1 tablet (2 mg total) by mouth daily at 6 PM.            Disposition   Discharge Orders    Future Appointments: Provider: Department: Dept Phone: Center:   07/24/2011 9:30 AM [facility] [phone] None   07/28/2011 9:15 AM Lewayne Bunting, MD [facility] [phone] [facility]   07/28/2011 9:30 AM [facility] [place] [phone] [facility]   07/31/2011 2:30 PM  Purcell Nails, MD [facility] [place] [phone] [facility]   09/25/2011 8:45 AM Lewayne Bunting, MD [facility] [phone] [facility]     Future Orders Please Complete By Expires   Diet - low sodium heart healthy      Increase activity slowly      Discharge instructions      Comments:   **PLEASE REMEMBER TO BRING ALL OF YOUR MEDICATIONS TO EACH OF YOUR FOLLOW-UP OFFICE VISITS.  * You were started on a blood thinner (coumadin) and will need close follow up of your coumadin levels (INR). You have an appointment with our coumadin clinic on 07/24/11. Please monitor for signs of bleeding and notify our office if you have concerns of bleeding.  * Due to low oxygenation while sleeping, it is suspected you have sleep apnea and it is recommended you have an outpatient sleep study and follow up with a pulmonologist. Please discuss this with you primary care provider.     Follow-up Information    Follow up with Carylon Perches, MD in 1 week.   Contact information:   [address] [place] [address] [phone]       Follow up with [place] [facility] on 07/24/2011. (9:30 - Coumadin level (INR) check)    Contact information:   [address] [place] [address] [phone]      Follow up with Olga Millers, MD on 07/28/2011. (Appointment at 9:15 followed by lab work (BMET))    [employer] information:   [employer] [address] [place], [address] [place] [address] [phone]           Outstanding Labs/Studies:  1. INR & BMET as scheduled above 2. Outpatient sleep study  Duration of Discharge Encounter: Greater than 30 minutes including physician and PA time.  Signed, Avilyn Virtue PA-C 07/21/2011, 11:06 AM

## 2011-07-21 NOTE — Discharge Instructions (Signed)
Warfarin, Questions and Answers Warfarin (Coumadin) is a prescription medication that delays normal blood clotting. This is called an "anticoagulant" or "blood thinner." These medications do not actually cause the blood to become less thick, only less likely to clot. Normally, when body tissues are cut or damaged, the blood clots in order to prevent blood loss. Some clots form when they are not supposed to and may travel through the bloodstream and become lodged in smaller blood vessels. Blockage of blood flow can cause serious problems including a stroke (when a blood vessel leading to a portion of the brain is blocked) or a heart attack (when a blood vessel leading to the heart is blocked). WHO SHOULD USE WARFARIN?  Warfarin is prescribed for individuals who have a risk for developing harmful blood clots. People at risk include individuals with a mechanical heart valve, individuals with the irregular heart rhythm called atrial fibrillation, and individuals with certain clotting disorders.   Warfarin is also used in individuals who have a history of developing harmful clots, including individuals who have had a stroke, heart attack, a clot which has traveled to the lung (pulmonary embolism), or a blood clot in the leg (deep venous thrombosis or DVT).   Warfarin may be used to prevent the growth of an existing clot, such as a DVT.  HOW IS THE EFFECT OF WARFARIN MONITORED? The goal of warfarin therapy is to lessen the clotting tendency of blood, but not to prevent clotting completely. Therefore, the anticoagulation effect of warfarin must be carefully watched using laboratory blood tests.  The test most commonly used to measure the effects of warfarin is the prothrombin time ("protime", or PT).   The PT is also used to compute a value known as the International Normalized Ratio (INR).   The longer it takes the blood to clot, the higher the PT and INR. Your caregiver will tell you what your "target"  INR range is. In most cases, the target range will be 2 to 3, although other ranges may be chosen. If, at any time, your INR is below the target range, there is a risk of clotting. If, on the other hand, your INR is above the target range, there is an increased risk of bleeding.  When warfarin is first prescribed, a higher "loading" dose may be given so that an effective blood level of the medication is reached quickly. The loading dose is then adjusted downward until a maintenance dose (a dose which is enough to keep the INR where it should be) is reached. Once you are on a stable maintenance dose, the PT and INR are watched less often, usually once every 2 to 4 weeks. The warfarin dose may be changed at times in response to a changing INR or to medical changes that call for an increase or decrease in warfarin therapy. It is important to keep all laboratory and caregiver follow-up appointments! Not keeping appointments could result in a chronic or permanent injury, pain, or disability. WHAT ARE THE SIDE EFFECTS OF WARFARIN?  Excessive blood thinning can cause bleeding (hemorrhage) from any part of the body. Individuals on warfarin should report any falls or accidents, or any symptoms of bleeding or unusual bruising. Signs of unusual bleeding may include bleeding from the gums, blood in the urine, bloody or dark stool, a nosebleed, coughing up blood, or vomiting blood. Because the risk of bleeding increases as the INR rises above the desired limit, the INR is closely watched during warfarin therapy. Adjustments are  made as needed to keep the INR at an acceptable level (within the target range).   Other body systems can be affected by warfarin. In addition to any signs of bleeding, patients should report skin rash or irritation, unusual fever, continual nausea or stomach upset, severe pain in the joints or back, swelling or pain at an injection site, or symptoms of a stroke.  IS WARFARIN SAFE TO USE DURING  PREGNANCY?  Warfarin is generally not advised during pregnancy, at least during the first trimester, due to an increased risk of birth defects. A woman who becomes pregnant or plans to become pregnant while on warfarin therapy should notify the caregiver immediately.   Although warfarin does not pass into breast milk, a woman who wishes to breastfeed while taking warfarin should also consult with her caregiver.  ARE THERE SPECIAL PRECAUTIONS TO FOLLOW WHILE TAKING WARFARIN? Follow the dosage schedule carefully. Warfarin should be taken exactly as directed. A missed or extra dose requires a call to the prescribing caregiver for advice. Do not change the dose of warfarin on your own to make up for missed or extra doses. It is very important to take warfarin as directed since bleeding or blood clots could result in chronic or permanent injury, pain, or disability. Warfarin tablets come in different strengths. Each tablet strength is usually a different color, with the amount of warfarin (in milligrams) clearly printed on the tablet. You should immediately report to the pharmacist or caregiver any prescription which is different than the previous one, to make sure that you are taking the correct dose. Avoid situations that cause bleeding. You may have a tendency to bleed more easily than usual while taking warfarin. Some simple changes which can limit bleeding are:  Use a softer toothbrush.   Floss with waxed floss rather than unwaxed floss.   Shave with an electric razor rather than a blade.   Limit use of sharp objects.   Avoid potentially harmful activities (such as contact sports).  Medical conditions. Medical conditions which increase your clotting time include:  Diarrhea.   Worsening of heart failure.   Fever.   Worsening of liver function.  Alcohol use. Chronic use of alcohol affects the body's ability to handle warfarin. You should avoid alcohol or consume it only in very limited  quantities. General alcohol intake guidelines are 1 drink for women and 2 drinks for men per day. (1 drink = 5 ounces of wine, 12 ounces of beer or 1 ounces of liquor). Other medications. A number of drugs can interact with warfarin. Some of the most common over-the-counter pain relievers (acetaminophen, aspirin, and nonsteroidal anti-inflammatory medications) make the anticoagulant effects of warfarin stronger. If these medicines are taken, your caregiver may need to adjust the dose of warfarin. Talk to your caregiver if you have problems or questions. Dietary considerations. Some foods and supplements may interfere with warfarin's effectiveness. Examples appear below. Once you are stabilized on a warfarin regimen, no major dietary changes should be made without consulting your caregiver. Vitamin K. Foods high in vitamin K can cause warfarin to be less effective. You should avoid eating very large amounts of foods known to be high in vitamin K. The serving size for foods high in vitamin K are  cup cooked and 1 cup raw, unless otherwise noted, including:  Beef liver (3.5 ounces).   Garbanzo beans.   Kale.   Spinach.   Nettle greens.   Swiss chard.   Pork liver (3.5 ounces).  Broccoli.   Cabbage.   Watercress.   Soybeans.   Endive.   Green tea (made with  ounce dried tea).   Brussels sprouts.   Cauliflower.   Parsley (1 Tablespoon).   Collard greens.   Seaweed (limit 2 sheets).   Turnip greens.   Soybean oil.   Green peas.  Eating large amounts of these foods may seriously reduce how well a dose of warfarin works. It is not necessary to avoid these foods, but do not change the amount of vitamin K-containing foods you currently eat. Changing to a diet low in foods containing vitamin K may lead to an excessive warfarin effect. Do not drink herbal teas containing coumarin while taking this medication. IS IT SAFE TO USE SUPPLEMENTS WHILE TAKING WARFARIN?  Vitamin E.  Vitamin E may increase the anticoagulant effects of warfarin. You should consult your caregiver before adding or changing a dose of vitamin E or any other vitamin.   Check other supplements such as multivitamins and calcium supplements. These may contain vitamin K.  FOR MORE INFORMATION Your caregiver is the best resource for important information related to your particular case. Because every person is different, it is important that your situation is looked at by someone who knows you as a whole person and also knows the specifics of your condition. Document Released: 03/06/2005 Document Revised: 02/23/2011 Document Reviewed: 05/29/2006 [facility] Patient Information 2012 [place], [place].  Pulmonary Embolus A pulmonary (lung) embolus (PE) is a blood clot that has traveled from another place in the body to the lung. Most clots come from deep veins in the legs or pelvis. PE is a dangerous and potentially life-threatening condition that can be treated if identified. CAUSES Blood clots form in a vein for different reasons. Usually several things cause blood clots. They include:  The flow of blood slows down.   The inside of the vein is damaged in some way.   The person has a condition that makes the blood clot more easily. These conditions may include:   Older age (especially over 58 years old).   Having a history of blood clots.   Having major or lengthy surgery. Hip surgery is particularly high-risk.   Breaking a hip or leg.   Sitting or lying still for a long time.   Cancer or cancer treatment.   Having a long, thin tube (catheter) placed inside a vein during a medical procedure.   Being overweight (obese).   Pregnancy and childbirth.   Medicines with estrogen.   Smoking.   Other circulation or heart problems.  SYMPTOMS  The symptoms of a PE usually start suddenly and include:  Shortness of breath.   Coughing.   Coughing up blood or blood-tinged mucus (phlegm).    Chest pain. Pain is often worse with deep breaths.   Rapid heartbeat.  DIAGNOSIS  If a PE is suspected, your caregiver will take a medical history and carry out a physical exam. Your caregiver will check for the risk factors listed above. Tests that also may be required include:  Blood tests, including studies of the clotting properties of your blood.   Imaging tests. Ultrasound, CT, MRI, and other tests can all be used to see if you have clots in your legs or lungs. If you have a clot in your legs and have breathing or chest problems, your caregiver may conclude that you have a clot in your lungs. Further lung tests may not be needed.   An EKG can look for  heart strain from blood clots in the lungs.  PREVENTION   Exercise the legs regularly. Take a brisk 30 minute walk every day.   Maintain a weight that is appropriate for your height.   Avoid sitting or lying in bed for long periods of time without moving your legs.   Women, particularly those over the age of 21, should consider the risks and benefits of taking estrogen medicines, including birth control pills.   Do not smoke, especially if you take estrogen medicines.   Long-distance travel can increase your risk. You should exercise your legs by walking or pumping the muscles every hour.   In hospital prevention:   Your caregiver will assess your need for preventive PE care (prophylaxis) when you are admitted to the hospital. If you are having surgery, your surgeon will assess you the day of or day after surgery.   Prevention may include medical and nonmedical measures.  TREATMENT   The most common treatment for a PE is blood thinning (anticoagulant) medicine, which reduces the blood's tendency to clot. Anticoagulants can stop new blood clots from forming and old ones from growing. They cannot dissolve existing clots. Your body does this by itself over time. Anticoagulants can be given by mouth, by intravenous (IV) access, or  by injection. Your caregiver will determine the best program for you.   Less commonly, clot-dissolving drugs (thrombolytics) are used to dissolve a PE. They carry a high risk of bleeding, so they are used mainly in severe cases.   Very rarely, a blood clot in the leg needs to be removed surgically.   If you are unable to take anticoagulants, your caregiver may arrange for you to have a filter placed in a main vein in your belly (abdomen). This filter prevents clots from traveling to your lungs.  HOME CARE INSTRUCTIONS   Take all medicines prescribed by your caregiver. Follow the directions carefully.   You will most likely continue taking anticoagulants after you leave the hospital. Your caregiver will advise you on the length of treatment (usually 3 to 6 months, sometimes for life).   Taking too much or too little of an anticoagulant is dangerous. While taking this type of medicine, you will need to have regular blood tests to be sure the dose is correct. The dose can change for many reasons. It is critically important that you take this medicine exactly as prescribed and that you have blood tests exactly as directed.   Many foods can interfere with anticoagulants. These include foods high in vitamin K, such as spinach, kale, broccoli, cabbage, collard and turnip greens, Brussels sprouts, peas, cauliflower, seaweed, parsley, beef and pork liver, green tea, and soybean oil. Your caregiver should discuss limits on these foods with you or you should arrange a visit with a dietician to answer your questions.   Many medicines can interfere with anticoagulants. You must tell your caregiver about any and all medicines you take. This includesall vitamins and supplements. Be especially cautious with aspirin and anti-inflammatory medicines. Ask your caregiver before taking these.   Anticoagulants can have side effects, mostly excessive bruising or bleeding. You will need to hold pressure over cuts for  longer than usual. Avoid alcoholic drinks or consume only very small amounts while taking this medicine.   If you are taking an anticoagulant:   Wear a medical alert bracelet.   Notify your dentist or other caregivers before procedures.   Avoid contact sports.   Ask your caregiver how soon you  can go back to normal activities. Not being active can lead to new clots. Ask for a list of what you should and should not do.   Exercise your lower leg muscles. This is important while traveling.   You may need to wear compression stockings. These are tight elastic stockings that apply pressure to the lower legs. This can help keep the blood in the legs from clotting.   If you are a smoker, you should quit.   Learn as much as you can about pulmonary embolisms.  SEEK MEDICAL CARE IF:   You notice a rapid heartbeat.   You feel weaker or more tired than usual.   You feel faint.   You notice increased bruising.   Your symptoms are not getting better in the time expected.   You are having side effects of medicine.   You have an oral temperature above 102 F (38.9 C).   You discover other family members with blood clots. This may require further testing for inherited diseases or conditions.  SEEK IMMEDIATE MEDICAL CARE IF:   You have chest pain.   You have trouble breathing.   You have new or increased swelling or pain in one leg.   You cough up blood.   You notice blood in vomit, in a bowel movement, or in urine.   You have an oral temperature above 102 F (38.9 C), not controlled by medicine.  You may have another PE. A blood clot in the lungs is a medical emergency. Call your local emergency services (911 in [place] to get to the nearest hospital or clinic. Do not drive yourself. MAKE SURE YOU:   Understand these instructions.   Will watch your condition.   Will get help right away if you are not doing well or get worse.  Document Released: 03/03/2000 Document Revised:  02/23/2011 Document Reviewed: 09/07/2008 [facility] Patient Information 2012 [place], [place].  Cardiac Diet This diet can help prevent heart disease and stroke. Many factors influence your heart health, including eating and exercise habits. Coronary risk rises a lot with abnormal blood fat (lipid) levels. Cardiac meal planning includes limiting unhealthy fats, increasing healthy fats, and making other small dietary changes. General guidelines are as follows:  Adjust calorie intake to reach and maintain desirable body weight.   Limit total fat intake to less than 30% of total calories. Saturated fat should be less than 7% of calories.   Saturated fats are found in animal products and in some vegetable products. Saturated vegetable fats are found in coconut oil, cocoa butter, palm oil, and palm kernel oil. Read labels carefully to avoid these products as much as possible. Use butter in moderation. Choose tub margarines and oils that have 2 grams of fat or less. Good cooking oils are canola and olive oils.   Practice low-fat cooking techniques. Do not fry food. Instead, broil, bake, boil, steam, grill, roast on a rack, stir-fry, or microwave it. Other fat reducing suggestions include:   Remove the skin from poultry.   Remove all visible fat from meats.   Skim the fat off stews, soups, and gravies before serving them.   Steam vegetables in water or broth instead of sauting them in fat.   Avoid foods with trans fat (or hydrogenated oils), such as commercially fried foods and commercially baked goods. Commercial shortening and deep-frying fats will contain trans fat.   Increase intake of fruits, vegetables, whole grains, and legumes to replace foods high in fat.   Increase  consumption of nuts, legumes, and seeds to at least 4 servings weekly. One serving of a legume equals  cup, and 1 serving of nuts or seeds equals  cup.   Choose whole grains more often. Have 3 servings per day (a serving is  1 ounce [oz]).   Have at least 4 cups of fruit and vegetable a day.   Increase your intake of soluble fiber to 10 to 25 grams per day. Soluble fiber binds cholesterol to be removed from the blood. Foods high in soluble fiber are dried beans, citrus fruits, oats, apples, bananas, broccoli, Brussels sprouts, and eggplant.   Try to include foods fortified with plant sterols or stanols, such as yogurt, breads, juices, or margarines. Choose several fortified foods to achieve a daily intake of 2 to 3 grams of plant sterols or stanols.   Foods with omega-3 fats can help reduce your risk of heart disease. Aim to have a 3.5 oz portion of fatty fish twice per week, such as salmon, mackerel, albacore tuna, sardines, lake trout, or herring. If you wish to take a fish oil supplement, choose one that contains 1 gram of both DHA and EPA.   Limit processed meats to 2 servings (3 oz portion) weekly.   Limit the sodium in your diet to 1500 milligrams (mg) per day. If you have high blood pressure, talk to a registered dietitian about a DASH (Dietary Approaches to Stop Hypertension) eating plan.   Limit beverages with added sugar, such as soda, to no more than 36 ounces per week.  CHOOSING FOODS Starches  Allowed: Breads: All kinds (wheat, rye, raisin, white, oatmeal, [place], [place], and English muffin bread). Low-fat rolls: English muffins, frankfurter and hamburger buns, bagels, pita bread, tortillas (not fried). Pancakes, waffles, biscuits, and muffins made with recommended oil.   Avoid: Products made with saturated or trans fats, oils, or whole milk products. Butter rolls, cheese breads, croissants. Commercial doughnuts, muffins, sweet rolls, biscuits, waffles, pancakes, store-bought mixes.  Crackers  Allowed: Low-fat crackers and snacks: Animal, graham, rye, saltine (with recommended oil, no lard), oyster, and matzo crackers. Bread sticks, melba toast, rusks, flatbread, pretzels, and light popcorn.    Avoid: High-fat crackers: cheese crackers, butter crackers, and those made with coconut, palm oil, or trans fat (hydrogenated oils). Buttered popcorn.  Cereals  Allowed: Hot or cold whole-grain cereals.   Avoid: Cereals containing coconut, hydrogenated vegetable fat, or animal fat.  Potatoes / Pasta / Rice  Allowed: All kinds of potatoes, rice, and pasta (such as macaroni, spaghetti, and noodles).   Avoid: Pasta or rice prepared with cream sauce or high-fat cheese. Chow mein noodles, [place] fries.  Vegetables  Allowed: All vegetables and vegetable juices.   Avoid: Fried vegetables. Vegetables in cream, butter, or high-fat cheese sauces. Limit coconut. Fruit in cream or custard.  Meat and Meat Substitutes  Allowed: Limit your intake of meat, seafood, and poultry to no more than 6 oz (cooked weight) per day. All lean, well-trimmed beef, veal, pork, and lamb. All chicken and [place] without skin. All fish and shellfish. Wild game: wild duck, rabbit, pheasant, and venison. Meatless dishes: recipes with dried beans, peas, lentils, and tofu (soybean curd). Seeds and nuts: all seeds and most nuts.   Avoid: Prime grade and other heavily marbled and fatty meats, such as short ribs, spare ribs, rib eye roast or steak, frankfurters, sausage, bacon, and high-fat luncheon meats, mutton. Caviar. Commercially fried fish. Domestic duck, goose, venison sausage. Organ meats: liver, gizzard, heart, chitterlings,  brains, kidney, sweetbreads.  Dairy  Allowed: Egg whites or low-cholesterol egg substitutes may be used as desired. Low-fat cheeses: nonfat or low-fat cottage cheese (1% or 2% fat), cheeses made with part skim milk, such as mozzarella, farmers, string, or ricotta. (Cheeses should be labeled no more than 2 to 6 grams fat per oz.)   Avoid: Whole milk cheeses, including colby, cheddar, [place], [address], [place], [place], [place], [address], Swiss, and blue. Creamed cottage cheese, cream cheese.   Milk  Allowed: Skim (or 1%) milk: liquid, powdered, or evaporated. Buttermilk made with low-fat milk. Drinks made with skim or low-fat milk or cocoa. Chocolate milk or cocoa made with skim or low-fat (1%) milk. Nonfat or low-fat yogurt.   Avoid: Whole milk and whole milk products, including buttermilk or yogurt made from whole milk, drinks made from whole milk. Condensed milk, evaporated whole milk, and 2% milk.  Soups and Combination Foods  Allowed: Low-fat low-sodium soups: broth, dehydrated soups, homemade broth, soups with the fat removed, homemade cream soups made with skim or low-fat milk. Low-fat spaghetti, lasagna, chili, and Spanish rice if low-fat ingredients and low-fat cooking techniques are used.   Avoid: Cream soups made with whole milk, cream, or high-fat cheese. All other soups.  Desserts and Sweets  Allowed: Sherbet, fruit ices, gelatins, meringues, and angel food cake. Homemade desserts with recommended fats, oils, and milk products. Jam, jelly, honey, marmalade, sugars, and syrups. Pure sugar candy, such as gum drops, hard candy, jelly beans, marshmallows, mints, and small amounts of dark chocolate.   Avoid: Commercially prepared cakes, pies, cookies, frosting, pudding, or mixes for these products. Desserts containing whole milk products, chocolate, coconut, lard, palm oil, or palm kernel oil. Ice cream or ice cream drinks. Candy that contains chocolate, coconut, butter, hydrogenated fat, or unknown ingredients. Buttered syrups.  Fats and Oils  Allowed: Vegetable oils: safflower, sunflower, corn, soybean, cottonseed, sesame, canola, olive, or peanut. Non-hydrogenated margarines. Salad dressing or mayonnaise: homemade or commercial, made with a recommended oil. Low or nonfat salad dressing or mayonnaise.   Limit added fats and oils to 6 to 8 tsp per day (includes fats used in cooking, baking, salads, and spreads on bread). Remember to count the "hidden fats" in foods.    Avoid: Solid fats and shortenings: butter, lard, salt pork, bacon drippings. Gravy containing meat fat, shortening, or suet. Cocoa butter, coconut. Coconut oil, palm oil, palm kernel oil, or hydrogenated oils: these ingredients are often used in bakery products, nondairy creamers, whipped toppings, candy, and commercially fried foods. Read labels carefully. Salad dressings made of unknown oils, sour cream, or cheese, such as blue cheese and Roquefort. Cream, all kinds: half-and-half, light, heavy, or whipping. Sour cream or cream cheese (even if "light" or low-fat). Nondairy cream substitutes: coffee creamers and sour cream substitutes made with palm, palm kernel, hydrogenated oils, or coconut oil.  Beverages  Allowed: Coffee (regular or decaffeinated), tea. diet carbonated beverages, mineral water. Alcohol: Check with your caregiver. Moderation is recommended.   Avoid: Whole milk, regular sodas, and juice drinks with added sugar.  Condiments  Allowed: All seasonings and condiments. Cocoa powder. "Cream" sauces made with recommended ingredients.   Avoid: Carob powder made with hydrogenated fats.  SAMPLE MENU Breakfast   cup orange juice    cup oatmeal   1 slice toast   1 tsp margarine   1 cup skim milk  Lunch  [place] sandwich with 2 oz [place], 2 slices bread   Lettuce and tomato slices  Fresh fruit   Carrot sticks   Coffee or tea   Snack   Fresh fruit or low-fat crackers  Dinner  3 oz lean ground beef   1 baked potato   1 tsp margarine    cup asparagus   Lettuce salad   1 tbs non-creamy dressing    cup peach slices   1 cup skim milk  Document Released: 12/14/2007 Document Revised: 02/23/2011 Document Reviewed: 03/08/2010 [facility] Patient Information 2012 [place], [place].  Amiodarone tablets What is this medicine? AMIODARONE (a MEE oh da rone) is an antiarrhythmic drug. It helps make your heart beat regularly. Because of the side effects caused by  this medicine, it is only used when other medicines have not worked. It is usually used for heartbeat problems that may be life threatening. This medicine may be used for other purposes; ask your health care provider or pharmacist if you have questions. What should I tell my health care provider before I take this medicine? They need to know if you have any of these conditions: -liver disease -lung disease -other heart problems -thyroid disease -an unusual or allergic reaction to amiodarone, iodine, other medicines, foods, dyes, or preservatives -pregnant or trying to get pregnant -breast-feeding How should I use this medicine? Take this medicine by mouth with a glass of water. Follow the directions on the prescription label. You can take this medicine with or without food. However, you should always take it the same way each time. Take your doses at regular intervals. Do not take your medicine more often than directed. Do not stop taking except on the advice of your doctor or health care professional. A special MedGuide will be given to you by the pharmacist with each prescription and refill. Be sure to read this information carefully each time. Talk to your pediatrician regarding the use of this medicine in children. Special care may be needed. Overdosage: If you think you have taken too much of this medicine contact a poison control center or emergency room at once. NOTE: This medicine is only for you. Do not share this medicine with others. What if I miss a dose? If you miss a dose, take it as soon as you can. If it is almost time for your next dose, take only that dose. Do not take double or extra doses. What may interact with this medicine? Do not take this medicine with any of the following medications: -abarelix -amoxapine -apomorphine -arsenic trioxide -certain macrolide antibiotics -certain quinolone  antibiotics -cisapride -droperidol -haloperidol -hawthorn -levomethadyl -maprotiline -medicines for malaria like chloroquine and halofantrine -medicines for mental depression such as tricyclic antidepressants -medicines to control heart rhythm like disopyramide, dofetilide, ibutilide, propafenone, and sotalol -methadone -mibefradil -pentamidine -phenothiazines like chlorpromazine, mesoridazine, and thioridazine -pimozide -probucol -ranolazine -sertindole -vardenafil -red yeast rice -ziprasidone This medicine may also interact with the following medications: -beta blockers -calcium channel blockers -cholestyramine -cimetidine -clopidogrel -cyclosporine -dextromethorphan -digoxin -diuretics -fentanyl -flecainide -fluindione -general anesthetics -grapefruit juice -lidocaine -loratadine -medicines for fungal infections like ketoconazole, fluconazole, and itraconazole -medicines for HIV, AIDS -medicines for seizures such as phenytoin -medicines for thyroid problems -medicines to lower cholesterol such as atorvastatin, cerivastatin, lovastatin, or simvastatin -methotrexate -procainamide -quinidine -rifampin, rifabutin, or rifapentine -St. John's Wort -trazodone -warfarin This list may not describe all possible interactions. Give your health care provider a list of all the medicines, herbs, non-prescription drugs, or dietary supplements you use. Also tell them if you smoke, drink alcohol, or use illegal drugs. Some items may interact with  your medicine. What should I watch for while using this medicine? Your condition will be monitored closely when you first begin therapy. Often, this drug is first started in a hospital or other monitored health care setting. Once you are on maintenance therapy, visit your doctor or health care professional for regular checks on your progress. Because your condition and use of this medicine carry some risk, it is a good idea to carry an  identification card, necklace or bracelet with details of your condition, medications, and doctor or health care professional. Bonita Quin may get drowsy or dizzy. Do not drive, use machinery, or do anything that needs mental alertness until you know how this medicine affects you. Do not stand or sit up quickly, especially if you are an older patient. This reduces the risk of dizzy or fainting spells. This medicine can make you more sensitive to the sun. Keep out of the sun. If you cannot avoid being in the sun, wear protective clothing and use sunscreen. Do not use sun lamps or tanning beds/booths. You should have regular eye exams before and during treatment. Call your doctor if you have blurred vision, see halos, or your eyes become sensitive to light. Your eyes may get dry. It may be helpful to use a lubricating eye solution or artificial tears solution. If you are going to have surgery or a procedure that requires contrast dyes, tell your doctor or health care professional that you are taking this medicine. What side effects may I notice from receiving this medicine? Side effects that you should report to your doctor or health care professional as soon as possible: -allergic reactions like skin rash, itching or hives, swelling of the face, lips, or tongue -blue-gray coloring of the skin -blurred vision, seeing blue green halos, increased sensitivity of the eyes to light -breathing problems -chest pain -dark urine -fast, irregular heartbeat -feeling faint or light-headed -intolerance to heat or cold -nausea or vomiting -pain and swelling of the scrotum -pain, tingling, numbness in feet, hands -redness, blistering, peeling or loosening of the skin, including inside the mouth -spitting up blood -stomach pain -sweating -unusual or uncontrolled movements of body -unusually weak or tired -weight gain or loss -yellowing of the eyes or skin Side effects that usually do not require medical attention  (report to your doctor or health care professional if they continue or are bothersome): -change in sex drive or performance -constipation -dizziness -headache -loss of appetite -trouble sleeping This list may not describe all possible side effects. Call your doctor for medical advice about side effects. You may report side effects to FDA at 1-800-FDA-1088. Where should I keep my medicine? Keep out of the reach of children. Store at room temperature between 20 and 25 degrees C (68 and 77 degrees F). Protect from light. Keep container tightly closed. Throw away any unused medicine after the expiration date. NOTE: This sheet is a summary. It may not cover all possible information. If you have questions about this medicine, talk to your doctor, pharmacist, or health care provider.  2012, [employer]vier/Gold Standard. (10/02/2008 2:08:28 PM)

## 2011-07-21 NOTE — Discharge Summary (Signed)
See progress notes Terri Edwards  

## 2011-07-21 NOTE — Progress Notes (Signed)
HPI:  61 year old female with PMH of AVR on 4/15 who presents to the hospital with SOB, noted to be in a-fib with RVR and a CTA was ordered. CTA revealed by lateral PEs. Patient was also noted to have bilateral pulmonary infiltrate and elevation in here WBC. Denied any fever, chills, N/V but did report expectorating brown sputum with no blood in it. She also did report bilateral lower extremities edema. No previous personal or family history of blood clots.  Antibiotics:   PIV  Cultures/Sepsis Markers:   Blood 4/29>>> NTD Urine 4/29>>> NTD Sputum 4/29>>> NTD  Access/Protocols:  Vanc 4/29>>> 5/2 Cefepime 4/29>>>5/2 5/2 Augmentin>>  Best Practice: DVT: coumadin GI: Protonix  Subjective: Appears well, ambulated on room air with sats >94%.  Physical Exam:  Intake/Output Summary (Last 24 hours) at 07/21/11 1200 Last data filed at 07/20/11 2100  Gross per 24 hour  Intake    840 ml  Output      0 ml  Net    840 ml     Patient Vitals for the past 24 hrs:  BP Temp Temp src Pulse Resp SpO2  07/21/11 0926 131/66 mmHg - - 82  - -  07/21/11 0500 148/73 mmHg 98.4 F (36.9 C) Oral 86  18  92 %  07/20/11 2100 121/60 mmHg 98.5 F (36.9 C) Oral 79  18  96 %  07/20/11 1356 102/60 mmHg 98.3 F (36.8 C) Oral 83  16  95 %    Neuro: Alert and oriented, moving all ext to commands. Cardiac: RRR, Nl S1/S2, -M/R/G. Pulmonary: Bibasilar crackles. GI: Soft, NT, ND and +BS. Extremities: 1+ edema bilaterally.  Labs:  Lab 07/21/11 0510 07/20/11 0540 07/19/11 0428  NA 139 140 139  K 4.3 4.1 3.3*  CL 101 101 98  CO2 30 31 32  BUN 7 8 9   CREATININE 0.90 0.93 0.91  GLUCOSE 99 114* 130*    Lab 07/19/11 0428 07/18/11 0418 07/17/11 0352  HGB 8.2* 8.6* 8.6*  HCT 25.6* 27.2* 27.1*  WBC 14.3* 17.2* 21.4*  PLT 359 317 343       Assessment & Plan: 61 year old female with new onset provoked PE and diffuse pulmonary infiltrate. No evidence of hemoptysis to suggest acute severe pulmonary  infarcts at this time. WBC makes PNA much more likely. Off O2 , improving.  1. PE: provoked after a period of immobility following AVR.   Plan:  Cont anticoagulation with coumadin for total of 6 months     Her RF is well defined (prolonged hospitalization) and therefore, she should not require a hypercoag w/u.   2. Leukocytosis/pulmonary infiltrate: There is little evidence to support a dx of active bacterial process. Would complete 7 days abx (transitioned to Augmentin today - would continue through 5/5).    3. Pleural effusion: ? Related to cardiac disorder and or VTE  Plan:  Diuresis per cards.    PA/lat CXR to re-eval 5/2  4. Smoking: quit 3 wks ago after 64 pack years of smoking, no indication for treatment as a COPD exacerbation however.  5. Consider outpatient sleep study.    PCCM will sigh off, please call if needed.  Brett Canales Minor ACNP Adolph Pollack PCCM Pager [phone] till 3 pm If no answer page [phone] 07/21/2011, 12:00 PM    Seen and database reviewed with ACNP Minor. Agree with above. Please call if we can be of further assistance   Billy Fischer, MD;  PCCM service; Mobile [phone]

## 2011-07-21 NOTE — Progress Notes (Signed)
@   Subjective:  Mild DOE but improved; no cough.   Objective:  Filed Vitals:   07/20/11 0926 07/20/11 1356 07/20/11 2100 07/21/11 0500  BP: 113/68 102/60 121/60 148/73  Pulse: 88 83 79 86  Temp:  98.3 F (36.8 C) 98.5 F (36.9 C) 98.4 F (36.9 C)  TempSrc:  Oral Oral Oral  Resp:  16 18 18   Height:      Weight:      SpO2:  95% 96% 92%    Intake/Output from previous day:  Intake/Output Summary (Last 24 hours) at 07/21/11 0731 Last data filed at 07/20/11 2100  Gross per 24 hour  Intake   1200 ml  Output      0 ml  Net   1200 ml    Physical Exam: Physical exam: Well-developed well-nourished in no acute distress.  Skin is warm and dry.  HEENT is normal.  Neck is supple.  Chest with diminished BS bases; sternotomy with mild erythema at superior aspect (improving) Cardiovascular exam is regular rate and rhythm. 2/6 systolic murmur; no diastolic murmur. Abdominal exam nontender or distended. No masses palpated. Extremities show trace edema. neuro grossly intact    Lab Results: Basic Metabolic Panel:  Basename 07/21/11 0510 07/20/11 0540  NA 139 140  K 4.3 4.1  CL 101 101  CO2 30 31  GLUCOSE 99 114*  BUN 7 8  CREATININE 0.90 0.93  CALCIUM 9.1 9.2  MG -- --  PHOS -- --   CBC:  Basename 07/19/11 0428  WBC 14.3*  NEUTROABS --  HGB 8.2*  HCT 25.6*  MCV 92.4  PLT 359    Assessment/Plan:  1) Postoperative atrial fibrillation - patient remains in sinus; will DC on amiodarone 200 mg daily. If she maintains sinus, will DC amiodarone in 8-12 weeks. Continue coumadin (goal INR 2-3). Continue toprol. 2) Postoperative volume excess - Continue lasix 40 mg po daily; volume status improving. 3) S/P AVR  4) Elevated WBC - chest CT with question pneumonia; Productive cough improving; Pulmonary suggests po antibiotics through 5/5. She has an intolerance to augmentin. Levaquin initiated; last dose 5/5. 5) Elevated LFTs - improved 6) Pulmonary embolus - Continue coumadin.  Watch INR closely given use of amiodarone and antibiotics. Lower ext dopplers neg. Duration of coumadin for 6-9 months. Will DC on 2 mg po daily; check INR in coumadin clinic Monday 5/6. 7) Possible sleep apnea - Desat previously noted; CPAP initiated by pulmonary; will need outpt sleep study and fu with pulmonary. DC today and fu with me and Dr Cornelius Moras as scheduled. I will check Bmet at time she returns. >30 min Pa and physician time D2 Olga Millers 07/21/2011, 7:31 AM

## 2011-07-24 ENCOUNTER — Ambulatory Visit (INDEPENDENT_AMBULATORY_CARE_PROVIDER_SITE_OTHER): Payer: BC Managed Care – PPO

## 2011-07-24 DIAGNOSIS — I2699 Other pulmonary embolism without acute cor pulmonale: Secondary | ICD-10-CM

## 2011-07-24 DIAGNOSIS — I4891 Unspecified atrial fibrillation: Secondary | ICD-10-CM

## 2011-07-24 DIAGNOSIS — Z954 Presence of other heart-valve replacement: Secondary | ICD-10-CM

## 2011-07-24 DIAGNOSIS — Z7901 Long term (current) use of anticoagulants: Secondary | ICD-10-CM | POA: Insufficient documentation

## 2011-07-24 DIAGNOSIS — Z953 Presence of xenogenic heart valve: Secondary | ICD-10-CM

## 2011-07-24 LAB — POCT INR: INR: 4

## 2011-07-24 LAB — CULTURE, BLOOD (ROUTINE X 2)
Culture  Setup Time: 201304300110
Culture: NO GROWTH
Culture: NO GROWTH

## 2011-07-24 NOTE — Patient Instructions (Signed)

## 2011-07-27 ENCOUNTER — Other Ambulatory Visit: Payer: Self-pay | Admitting: Thoracic Surgery (Cardiothoracic Vascular Surgery)

## 2011-07-27 DIAGNOSIS — I359 Nonrheumatic aortic valve disorder, unspecified: Secondary | ICD-10-CM

## 2011-07-28 ENCOUNTER — Ambulatory Visit
Admission: RE | Admit: 2011-07-28 | Discharge: 2011-07-28 | Disposition: A | Discharge status: Home / Self Care | Payer: BC Managed Care – PPO | Source: Ambulatory Visit | Attending: Thoracic Surgery (Cardiothoracic Vascular Surgery) | Admitting: Thoracic Surgery (Cardiothoracic Vascular Surgery)

## 2011-07-28 ENCOUNTER — Ambulatory Visit (INDEPENDENT_AMBULATORY_CARE_PROVIDER_SITE_OTHER): Payer: BC Managed Care – PPO | Admitting: *Deleted

## 2011-07-28 ENCOUNTER — Encounter: Payer: Self-pay | Admitting: Cardiology

## 2011-07-28 ENCOUNTER — Other Ambulatory Visit: Payer: BC Managed Care – PPO

## 2011-07-28 ENCOUNTER — Telehealth: Payer: Self-pay | Admitting: Cardiology

## 2011-07-28 ENCOUNTER — Ambulatory Visit (INDEPENDENT_AMBULATORY_CARE_PROVIDER_SITE_OTHER): Payer: BC Managed Care – PPO | Admitting: Cardiology

## 2011-07-28 VITALS — BP 94/40 | HR 86 | Ht 62.5 in | Wt 177.0 lb

## 2011-07-28 DIAGNOSIS — Z953 Presence of xenogenic heart valve: Secondary | ICD-10-CM

## 2011-07-28 DIAGNOSIS — Z954 Presence of other heart-valve replacement: Secondary | ICD-10-CM

## 2011-07-28 DIAGNOSIS — I2699 Other pulmonary embolism without acute cor pulmonale: Secondary | ICD-10-CM

## 2011-07-28 DIAGNOSIS — E8779 Other fluid overload: Secondary | ICD-10-CM

## 2011-07-28 DIAGNOSIS — I10 Essential (primary) hypertension: Secondary | ICD-10-CM

## 2011-07-28 DIAGNOSIS — Z7901 Long term (current) use of anticoagulants: Secondary | ICD-10-CM

## 2011-07-28 DIAGNOSIS — G4730 Sleep apnea, unspecified: Secondary | ICD-10-CM

## 2011-07-28 DIAGNOSIS — I359 Nonrheumatic aortic valve disorder, unspecified: Secondary | ICD-10-CM

## 2011-07-28 DIAGNOSIS — I4891 Unspecified atrial fibrillation: Secondary | ICD-10-CM

## 2011-07-28 LAB — POCT INR: INR: 2.2

## 2011-07-28 LAB — HEPATIC FUNCTION PANEL
ALT: 21 U/L (ref 0–35)
Total Bilirubin: 0.5 mg/dL (ref 0.3–1.2)
Total Protein: 7.7 g/dL (ref 6.0–8.3)

## 2011-07-28 LAB — BASIC METABOLIC PANEL
GFR: 60.43 mL/min (ref >60.00)
Potassium: 3.9 mEq/L (ref 3.5–5.1)
Sodium: 140 mEq/L (ref 135–145)

## 2011-07-28 MED ORDER — TRAMADOL HCL 50 MG PO TABS: 50.0000 mg | ORAL_TABLET | Freq: Four times a day (QID) | ORAL | Status: AC | PRN

## 2011-07-28 NOTE — Telephone Encounter (Signed)
New msg Pt wants to ask question about blood work results

## 2011-07-28 NOTE — Assessment & Plan Note (Signed)
Patient remains in sinus rhythm. Her atrial fibrillation was most likely related to her cardiac surgery. Continue amiodarone for 3 months. If she maintains sinus rhythm at that time will discontinue.

## 2011-07-28 NOTE — Patient Instructions (Signed)
Your physician recommends that you schedule a follow-up appointment in: 3 MONTHS  Your physician recommends that you HAVE LAB WORK TODAY

## 2011-07-28 NOTE — Assessment & Plan Note (Signed)
Patient noted to have episodes of desaturation at night in the hospital. Pulmonary recommended outpatient sleep study. Will arrange followup with pulmonary.

## 2011-07-28 NOTE — Assessment & Plan Note (Signed)
Volume status improving. Continue present dose of Lasix. Repeat potassium and renal function today. We'll also check liver functions as they were mildly elevated in the hospital.

## 2011-07-28 NOTE — Assessment & Plan Note (Signed)
Continue Coumadin for 6 months. Goal INR 2-3.

## 2011-07-28 NOTE — Assessment & Plan Note (Signed)
Blood pressure controlled. Continue present medications. 

## 2011-07-28 NOTE — Assessment & Plan Note (Signed)
Continue SBE prophylaxis. 

## 2011-07-28 NOTE — Assessment & Plan Note (Signed)
Continue statin. 

## 2011-07-28 NOTE — Progress Notes (Signed)
HPI: Pleasant female with aortic stenosis s/p AVR who presents in followup. Echo in Jan 2013 showed normal LV function, severe AS with mean gradient of 50 mmHg, mild LAE. She also had carotid Dopplers on May 29, 2008 that showed 0-39% bilateral stenosis. CTA in Jan 2013 showed no aneurysm. Cardiac catheterization in April of 2013 showed no coronary disease, normal systolic function. Patient had aortic valve replacement on April 17 with a pericardial tissue valve. Patient was discharged on April 21. Following discharge she developed atrial fibrillation with rapid ventricular response and was readmitted. She converted with amiodarone. She continued to have dyspnea and a chest CT showed a pulmonary embolus. She has been anticoagulated. She was also treated for pneumonia. Since discharge she has some dyspnea on exertion but no orthopnea, PND or pedal edema. No palpitations or syncope. No exertional chest pain.   Current Outpatient Prescriptions  Medication Sig Dispense Refill  . amiodarone (PACERONE) 200 MG tablet Take 1 tablet (200 mg total) by mouth daily.  30 tablet  3  . furosemide (LASIX) 40 MG tablet Take 1 tablet (40 mg total) by mouth daily.  30 tablet  3  . metoprolol succinate (TOPROL-XL) 25 MG 24 hr tablet Take 1 tablet (25 mg total) by mouth daily.  30 tablet  1  . neomycin-bacitracin-polymyxin (NEOSPORIN) OINT Apply 1 application topically daily. Apply to sternal incision      . pantoprazole (PROTONIX) 40 MG tablet Take 40 mg by mouth daily as needed. For reflux      . potassium chloride SA (K-DUR,KLOR-CON) 20 MEQ tablet Take 1 tablet (20 mEq total) by mouth daily.  30 tablet  3  . pravastatin (PRAVACHOL) 40 MG tablet Take 40 mg by mouth daily.      Marland Kitchen warfarin (COUMADIN) 2 MG tablet Take 1 tablet (2 mg total) by mouth daily at 6 PM.  30 tablet  3     Past Medical History  Diagnosis Date  . HTN (hypertension), benign   . Hypercholesteremia   . Bell's palsy 2005  . Obesity (BMI  30.0-34.9) 06/29/2011  . Aortic stenosis   . GERD (gastroesophageal reflux disease)   . Arthritis   . S/P aortic valve replacement with bioprosthetic valve 07/05/2011    21mm [facility] Ease pericardial tissue valve  . Atrial fibrillation     Post operative following AVR  . Pulmonary embolus     following AVR    Past Surgical History  Procedure Date  . Knee arthroscopy lft  . Cataract extraction     bil  . 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 Aortic valve replacement 07/05/2011    Procedure: AORTIC VALVE REPLACEMENT (AVR);  Surgeon: Purcell Nails, MD;  Location: [facility] OR;  Service: Open Heart Surgery;  Laterality: N/A;    History   Social History  . Marital Status: Married    Spouse Name: N/A    Number of Children: N/A  . 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 Smoking status: Former Smoker -- 1.0 packs/day for 45 years    Types: Cigarettes    Quit date: 07/04/2011  . Smokeless tobacco: Never Used  . Alcohol Use: No  . Drug Use: Not on file  . Sexually Active: Yes    Birth Control/ Protection: Post-menopausal   Other Topics Concern  . Not on file   Social History Narrative  . No narrative  on file    ROS: no fevers or chills, productive cough, hemoptysis, dysphasia, odynophagia, melena, hematochezia, dysuria, hematuria, rash, seizure activity, orthopnea, PND, pedal edema, claudication. Remaining systems are negative.  Physical Exam: Well-developed well-nourished in no acute distress.  Skin is warm and dry.  HEENT is normal.  Neck is supple.  Chest is clear to auscultation with normal expansion. Sternotomy without evidence of infection. Cardiovascular exam is regular rate and rhythm. 2/6 systolic murmur left sternal border. No diastolic murmur. Abdominal exam nontender or distended. No masses palpated. Small incision with minimal erythema. Extremities show no edema. neuro grossly intact  ECG  sinus rhythm at a rate of 86. Axis normal. Nonspecific ST changes

## 2011-07-28 NOTE — Telephone Encounter (Signed)
Spoke with pt, questions regarding furosemide and potassium answered. 

## 2011-07-31 ENCOUNTER — Ambulatory Visit (INDEPENDENT_AMBULATORY_CARE_PROVIDER_SITE_OTHER): Payer: Self-pay | Admitting: Thoracic Surgery (Cardiothoracic Vascular Surgery)

## 2011-07-31 ENCOUNTER — Encounter: Payer: Self-pay | Admitting: Thoracic Surgery (Cardiothoracic Vascular Surgery)

## 2011-07-31 VITALS — BP 105/60 | HR 92 | Resp 20 | Ht 62.5 in | Wt 184.0 lb

## 2011-07-31 DIAGNOSIS — Z953 Presence of xenogenic heart valve: Secondary | ICD-10-CM

## 2011-07-31 DIAGNOSIS — Z954 Presence of other heart-valve replacement: Secondary | ICD-10-CM

## 2011-07-31 NOTE — Patient Instructions (Signed)
The patient has been reminded to continue to avoid any heavy lifting or strenuous use of arms or shoulders for at least a total of three months from the time of surgery. The patient has been instructed that they may return driving an automobile as long as they are no longer requiring oral narcotic pain relievers during the daytime.  They have been advised to start driving short distances during the daylight and gradually increase from there as they feel comfortable.  

## 2011-07-31 NOTE — Progress Notes (Signed)
[address]            [name] [address]          [phone]     CARDIOTHORACIC SURGERY OFFICE NOTE  Referring Provider is Jens Som Madolyn Frieze, MD PCP is Carylon Perches, MD, MD   HPI:  Patient returns for routine followup status post aortic valve replacement on 07/05/2011. Initially the patient did quite well, but she was readmitted to the hospital with rapid atrial fibrillation and a pulmonary embolus. She converted back to sinus rhythm on amiodarone and she was discharged home on Coumadin. Since then she has continued to improve and she returns to the office for further followup today. She was seen in the office last week by Dr. Jens Som is well. She reports that her breathing continues to gradually improve. She still has very mild soreness in her chest, but this does not seem to limit her to much. Her appetite is good. She is still a little bit anxious that she might develop a recurrence of atrial fibrillation, but otherwise she has no complaints.   Current Outpatient Prescriptions  Medication Sig Dispense Refill  . amiodarone (PACERONE) 200 MG tablet Take 1 tablet (200 mg total) by mouth daily.  30 tablet  3  . furosemide (LASIX) 40 MG tablet Take 1 tablet (40 mg total) by mouth daily.  30 tablet  3  . metoprolol succinate (TOPROL-XL) 25 MG 24 hr tablet Take 1 tablet (25 mg total) by mouth daily.  30 tablet  1  . neomycin-bacitracin-polymyxin (NEOSPORIN) OINT Apply 1 application topically daily. Apply to sternal incision      . pantoprazole (PROTONIX) 40 MG tablet Take 40 mg by mouth daily as needed. For reflux      . potassium chloride SA (K-DUR,KLOR-CON) 20 MEQ tablet Take 1 tablet (20 mEq total) by mouth daily.  30 tablet  3  . pravastatin (PRAVACHOL) 40 MG tablet Take 40 mg by mouth daily.      . traMADol (ULTRAM) 50 MG tablet Take 1 tablet (50 mg total) by mouth every 6 (six) hours as needed for pain.  30 tablet  1  . warfarin (COUMADIN) 2 MG tablet  Take 1 tablet (2 mg total) by mouth daily at 6 PM.  30 tablet  3      Physical Exam:   BP 105/60  Pulse 92  Resp 20  Ht 5' 2.5" (1.588 m)  Wt 184 lb (83.462 kg)  BMI 33.12 kg/m2  SpO2 95%  LMP 04/29/1991  General:  Well-appearing  Chest:   Clear to auscultation  CV:   Regular rate and rhythm without murmur  Incisions:  Clean dry and healing nicely  Abdomen:  Soft and nontender  Extremities:  Warm and well-perfused  Diagnostic Tests:  Clinical Data: Cough, heart surgery in April, follow-up, former  smoking history  CHEST - 2 VIEW 07/28/2011 Comparison: Chest x-ray of 07/20/2011  Findings: Small bilateral pleural effusions remain although  aeration of the lung bases has improved slightly. There has been  some decrease in basilar atelectasis. Mild cardiomegaly is stable.  Median sternotomy sutures are noted and an aortic valve replacement  is present.  IMPRESSION:  Slightly better aeration with some decrease in basilar atelectasis  and small effusions.  Original Report Authenticated By: Juline Patch, M.D.    Impression:  Patient is progressing nicely now one month following aortic  valve replacement although she was rehospitalized 2 weeks ago with rapid atrial fibrillation and pulmonary embolus. She is currently maintaining sinus rhythm on amiodarone and she is progressing nicely from a symptomatic standpoint.  Plan:  I've encouraged patient to continue to gradually increase her physical activity as tolerated with her primary limitation remaining that she refrain from heavy lifting or strenuous use of arms or shoulders for least another 2 months. I think she should also wait another 2 months before she considers going back to work. We have talked about driving and all of her questions been addressed. We'll plan to see her back in 3 months to make her that she continues to recover.   Salvatore Decent. Cornelius Moras, MD 07/31/2011 2:56 PM

## 2011-08-04 ENCOUNTER — Ambulatory Visit (INDEPENDENT_AMBULATORY_CARE_PROVIDER_SITE_OTHER): Payer: BC Managed Care – PPO | Admitting: *Deleted

## 2011-08-04 DIAGNOSIS — Z7901 Long term (current) use of anticoagulants: Secondary | ICD-10-CM

## 2011-08-04 DIAGNOSIS — Z954 Presence of other heart-valve replacement: Secondary | ICD-10-CM

## 2011-08-04 DIAGNOSIS — I4891 Unspecified atrial fibrillation: Secondary | ICD-10-CM

## 2011-08-04 DIAGNOSIS — Z953 Presence of xenogenic heart valve: Secondary | ICD-10-CM

## 2011-08-04 DIAGNOSIS — I2699 Other pulmonary embolism without acute cor pulmonale: Secondary | ICD-10-CM

## 2011-08-04 LAB — POCT INR: INR: 2.4

## 2011-08-10 ENCOUNTER — Encounter (HOSPITAL_COMMUNITY)
Admission: RE | Admit: 2011-08-10 | Discharge: 2011-08-10 | Disposition: A | Discharge status: Home / Self Care | Payer: BC Managed Care – PPO | Source: Ambulatory Visit | Attending: Cardiology | Admitting: Cardiology

## 2011-08-10 ENCOUNTER — Encounter (HOSPITAL_COMMUNITY): Payer: Self-pay

## 2011-08-10 ENCOUNTER — Telehealth: Payer: Self-pay | Admitting: *Deleted

## 2011-08-10 ENCOUNTER — Ambulatory Visit (INDEPENDENT_AMBULATORY_CARE_PROVIDER_SITE_OTHER): Payer: BC Managed Care – PPO | Admitting: *Deleted

## 2011-08-10 DIAGNOSIS — I10 Essential (primary) hypertension: Secondary | ICD-10-CM | POA: Insufficient documentation

## 2011-08-10 DIAGNOSIS — Z953 Presence of xenogenic heart valve: Secondary | ICD-10-CM

## 2011-08-10 DIAGNOSIS — I359 Nonrheumatic aortic valve disorder, unspecified: Secondary | ICD-10-CM | POA: Insufficient documentation

## 2011-08-10 DIAGNOSIS — I4891 Unspecified atrial fibrillation: Secondary | ICD-10-CM

## 2011-08-10 DIAGNOSIS — I2699 Other pulmonary embolism without acute cor pulmonale: Secondary | ICD-10-CM

## 2011-08-10 DIAGNOSIS — Z882 Allergy status to sulfonamides status: Secondary | ICD-10-CM | POA: Insufficient documentation

## 2011-08-10 DIAGNOSIS — Z954 Presence of other heart-valve replacement: Secondary | ICD-10-CM | POA: Insufficient documentation

## 2011-08-10 DIAGNOSIS — E785 Hyperlipidemia, unspecified: Secondary | ICD-10-CM | POA: Insufficient documentation

## 2011-08-10 DIAGNOSIS — Z7982 Long term (current) use of aspirin: Secondary | ICD-10-CM | POA: Insufficient documentation

## 2011-08-10 DIAGNOSIS — E7800 Pure hypercholesterolemia, unspecified: Secondary | ICD-10-CM | POA: Insufficient documentation

## 2011-08-10 DIAGNOSIS — Z7901 Long term (current) use of anticoagulants: Secondary | ICD-10-CM

## 2011-08-10 DIAGNOSIS — Z5189 Encounter for other specified aftercare: Secondary | ICD-10-CM | POA: Insufficient documentation

## 2011-08-10 DIAGNOSIS — Z79899 Other long term (current) drug therapy: Secondary | ICD-10-CM | POA: Insufficient documentation

## 2011-08-10 DIAGNOSIS — Z96659 Presence of unspecified artificial knee joint: Secondary | ICD-10-CM | POA: Insufficient documentation

## 2011-08-10 DIAGNOSIS — F17200 Nicotine dependence, unspecified, uncomplicated: Secondary | ICD-10-CM | POA: Insufficient documentation

## 2011-08-10 DIAGNOSIS — E669 Obesity, unspecified: Secondary | ICD-10-CM | POA: Insufficient documentation

## 2011-08-10 LAB — POCT INR: INR: 2.4

## 2011-08-10 NOTE — Telephone Encounter (Signed)
Left message for pt to call.

## 2011-08-10 NOTE — Telephone Encounter (Signed)
Spoke with pt, Aware of dr crenshaw's recommendations.  °

## 2011-08-10 NOTE — Progress Notes (Signed)
Cardiac Rehab Medication Review by a Pharmacist  Does the patient  feel that his/her medications are working for him/her?  yes  Has the patient been experiencing any side effects to the medications prescribed?  no  Does the patient measure his/her own blood pressure or blood glucose at home?  yes   Does the patient have any problems obtaining medications due to transportation or finances?   no  Understanding of regimen: excellent Understanding of indications: excellent Potential of compliance: excellent    Pharmacist comments: Pt has some nausea with tramadol and time to time low blood pressure. She does monitor blood pressure at home.   Juliann Pulse 08/10/2011 8:14 AM

## 2011-08-10 NOTE — Telephone Encounter (Signed)
Message copied by Freddi Starr on Thu Aug 10, 2011 11:35 AM ------      Message from: Lewayne Bunting      Created: Thu Aug 10, 2011 11:27 AM      Regarding: FW: Low BP at Cardiac Rehab Orientation       Patient can dc lasix and kcl if edema has resolved; resume if needed      Olga Millers            ----- Message -----         From: Arna Medici, RN         Sent: 08/10/2011  11:00 AM           To: Deliah Goody, RN, Lewayne Bunting, MD      Subject: Low BP at Cardiac Rehab Orientation                       Pt in today for orientation to cardiac rehab (no exercise today)  Pt baseline bp was 92/54.  Pt was asymptomatic but did report that at times she feels "swimmy headed".  Lasix 40 mg and Kcl were added during her readmission to the hospital for afib for ankle swelling.  Pt did not take any diuretics prior to her AVR on 07/05/11.  Pt was unsure if she still needed to continue the lasix at this point and could this be causing her dizziness.            Please advise            Thanks

## 2011-08-16 ENCOUNTER — Encounter (HOSPITAL_COMMUNITY)
Admission: RE | Admit: 2011-08-16 | Discharge: 2011-08-16 | Disposition: A | Discharge status: Home / Self Care | Payer: BC Managed Care – PPO | Source: Ambulatory Visit | Attending: Cardiology | Admitting: Cardiology

## 2011-08-16 NOTE — Progress Notes (Signed)
Pt started cardiac rehab today.  Pt tolerated light exercise without difficulty.  Monitor shwoed sr with slight st depression and one pvc noted during exercise.  Previous in basket to Dr Jens Som in regards to the need to continue Lasix and KCL.  Pt placed on these due to ankle swelling after her second admission for Afib.  Pt noted that she felt dizzy at times when she stood up.  Dr. Jens Som d/cd medications and pt no longer has these symptoms and feels better.  Pt instructed by MD to monitor her weight and any swelling. Continue to monitor

## 2011-08-16 NOTE — Progress Notes (Signed)
Terri Edwards 61 y.o. female       Nutrition Screen                                                                    YES  NO Do you live in a nursing home?  X   Do you eat out more than 3 times/week?   X  If yes, how many times per week do you eat out? 4  times a week  Do you have food allergies?   X If yes, what are you allergic to?  Have you gained or lost more than 10 lbs without trying?               X If yes, how much weight have you lost and over what time period?    Do you want to lose weight?    X  If yes, what is a goal weight or amount of weight you would like to lose? 30 lb  Do you eat alone most of the time?   X   Do you eat less than 2 meals/day?  X If yes, how many meals do you eat?    Do you drink more than 3 alcohol drinks/day?  X If yes, how many drinks per day?   Are you having trouble with constipation? *  X If yes, what are you doing to help relieve constipation?  Do you have financial difficulties with buying food?*    X   Are you experiencing regular nausea/ vomiting?*     X   Do you have a poor appetite? *                                        X   Do you have trouble chewing/swallowing? *   X    Pt with diagnoses of:  x GERD          x AVR x Dyslipidemia  / HDL< 40 / LDL>70 / High TG      x %  Body fat >goal / Body Mass Index >25 x HTN / BP >120/80 x Pre-diabetes       Pt Risk Score   2       Diagnosis Risk Score  30       Total Risk Score   32                         High Risk               X Low Risk    HT: 61.25" Ht Readings from Last 1 Encounters:  08/10/11 5' 1.25" (1.556 m)    WT:   178.2 lb (81 kg) Wt Readings from Last 3 Encounters:  08/10/11 178 lb 9.2 oz (81 kg)  07/31/11 184 lb (83.462 kg)  07/28/11 177 lb (80.287 kg)     IBW 48.3 168%IBW BMI 33.5 43.9%body fat  Meds reviewed: Coumadin Past Medical History  Diagnosis Date  . HTN (hypertension), benign   . Hypercholesteremia   . Bell's palsy 2005  . Obesity (BMI 30.0-34.9)  06/29/2011  .  Aortic stenosis   . GERD (gastroesophageal reflux disease)   . Arthritis   . S/P aortic valve replacement with bioprosthetic valve 07/05/2011    21mm [facility] Ease pericardial tissue valve  . Atrial fibrillation     Post operative following AVR  . Pulmonary embolus     following AVR       Activity level: Pt is sedentary  Wt goal: 154-166 lb ( 70-75.5 kg) Current tobacco use? No     Pt quit tobacco use on 06/25/11 If pt using tobacco, is pt taking a vit C? No  Food/Drug Interaction? Yes      If Y, which med(s)? Coumadin If yes, pt given Food/Drug Interaction handout? Yes Labs:  Lipid Panel     Component Value Date/Time   CHOL 150 03/27/2011 0842   TRIG 79.0 03/27/2011 0842   HDL 47.90 03/27/2011 0842   CHOLHDL 3 03/27/2011 0842   VLDL 15.8 03/27/2011 0842   LDLCALC 86 03/27/2011 0842   Lab Results  Component Value Date   HGBA1C 5.8* 07/03/2011   07/28/11 Glucose 86  LDL goal: < 100      MI, DM, Carotid or PVD and > 2:      family h/o, >62 yo female Estimated Daily Nutrition Needs for: ? wt loss  1250-1500 Kcal , Total Fat 30-40gm, Saturated Fat 9-11 gm, Trans Fat 1.2-1.5 gm,  Sodium less than 1500 mg

## 2011-08-18 ENCOUNTER — Ambulatory Visit (INDEPENDENT_AMBULATORY_CARE_PROVIDER_SITE_OTHER): Payer: BC Managed Care – PPO | Admitting: *Deleted

## 2011-08-18 ENCOUNTER — Encounter (HOSPITAL_COMMUNITY)
Admission: RE | Admit: 2011-08-18 | Discharge: 2011-08-18 | Disposition: A | Discharge status: Home / Self Care | Payer: BC Managed Care – PPO | Source: Ambulatory Visit | Attending: Cardiology | Admitting: Cardiology

## 2011-08-18 DIAGNOSIS — Z954 Presence of other heart-valve replacement: Secondary | ICD-10-CM

## 2011-08-18 DIAGNOSIS — Z7901 Long term (current) use of anticoagulants: Secondary | ICD-10-CM

## 2011-08-18 DIAGNOSIS — I2699 Other pulmonary embolism without acute cor pulmonale: Secondary | ICD-10-CM

## 2011-08-18 DIAGNOSIS — I4891 Unspecified atrial fibrillation: Secondary | ICD-10-CM

## 2011-08-18 DIAGNOSIS — Z953 Presence of xenogenic heart valve: Secondary | ICD-10-CM

## 2011-08-18 LAB — POCT INR
INR: 2.1
INR: 2.1

## 2011-08-21 ENCOUNTER — Encounter (HOSPITAL_COMMUNITY)
Admission: RE | Admit: 2011-08-21 | Discharge: 2011-08-21 | Disposition: A | Discharge status: Home / Self Care | Payer: BC Managed Care – PPO | Source: Ambulatory Visit | Attending: Cardiology | Admitting: Cardiology

## 2011-08-21 DIAGNOSIS — Z5189 Encounter for other specified aftercare: Secondary | ICD-10-CM | POA: Insufficient documentation

## 2011-08-21 DIAGNOSIS — F17200 Nicotine dependence, unspecified, uncomplicated: Secondary | ICD-10-CM | POA: Insufficient documentation

## 2011-08-21 DIAGNOSIS — Z7982 Long term (current) use of aspirin: Secondary | ICD-10-CM | POA: Insufficient documentation

## 2011-08-21 DIAGNOSIS — Z96659 Presence of unspecified artificial knee joint: Secondary | ICD-10-CM | POA: Insufficient documentation

## 2011-08-21 DIAGNOSIS — I359 Nonrheumatic aortic valve disorder, unspecified: Secondary | ICD-10-CM | POA: Insufficient documentation

## 2011-08-21 DIAGNOSIS — E7800 Pure hypercholesterolemia, unspecified: Secondary | ICD-10-CM | POA: Insufficient documentation

## 2011-08-21 DIAGNOSIS — Z79899 Other long term (current) drug therapy: Secondary | ICD-10-CM | POA: Insufficient documentation

## 2011-08-21 DIAGNOSIS — E785 Hyperlipidemia, unspecified: Secondary | ICD-10-CM | POA: Insufficient documentation

## 2011-08-21 DIAGNOSIS — I10 Essential (primary) hypertension: Secondary | ICD-10-CM | POA: Insufficient documentation

## 2011-08-21 DIAGNOSIS — Z954 Presence of other heart-valve replacement: Secondary | ICD-10-CM | POA: Insufficient documentation

## 2011-08-21 DIAGNOSIS — E669 Obesity, unspecified: Secondary | ICD-10-CM | POA: Insufficient documentation

## 2011-08-21 DIAGNOSIS — Z882 Allergy status to sulfonamides status: Secondary | ICD-10-CM | POA: Insufficient documentation

## 2011-08-23 ENCOUNTER — Encounter (HOSPITAL_COMMUNITY)
Admission: RE | Admit: 2011-08-23 | Discharge: 2011-08-23 | Disposition: A | Discharge status: Home / Self Care | Payer: BC Managed Care – PPO | Source: Ambulatory Visit | Attending: Cardiology | Admitting: Cardiology

## 2011-08-25 ENCOUNTER — Encounter (HOSPITAL_COMMUNITY)
Admission: RE | Admit: 2011-08-25 | Discharge: 2011-08-25 | Disposition: A | Discharge status: Home / Self Care | Payer: BC Managed Care – PPO | Source: Ambulatory Visit | Attending: Cardiology | Admitting: Cardiology

## 2011-08-28 ENCOUNTER — Encounter (HOSPITAL_COMMUNITY)
Admission: RE | Admit: 2011-08-28 | Discharge: 2011-08-28 | Disposition: A | Discharge status: Home / Self Care | Payer: BC Managed Care – PPO | Source: Ambulatory Visit | Attending: Cardiology | Admitting: Cardiology

## 2011-08-28 NOTE — Progress Notes (Signed)
Reviewed home exercise with pt today.  Pt plans to walk at home and possibly go to the [facility] on bad weather days for exercise.  Reviewed THR, pulse, RPE, sign and symptoms, and when to call 911 or MD.  Pt voiced understanding. Fabio Pierce, MA, ACSM RCEP

## 2011-08-30 ENCOUNTER — Encounter (HOSPITAL_COMMUNITY)
Admission: RE | Admit: 2011-08-30 | Discharge: 2011-08-30 | Disposition: A | Discharge status: Home / Self Care | Payer: BC Managed Care – PPO | Source: Ambulatory Visit | Attending: Cardiology | Admitting: Cardiology

## 2011-08-30 ENCOUNTER — Ambulatory Visit (INDEPENDENT_AMBULATORY_CARE_PROVIDER_SITE_OTHER): Payer: BC Managed Care – PPO | Admitting: *Deleted

## 2011-08-30 DIAGNOSIS — I4891 Unspecified atrial fibrillation: Secondary | ICD-10-CM

## 2011-08-30 DIAGNOSIS — Z7901 Long term (current) use of anticoagulants: Secondary | ICD-10-CM

## 2011-08-30 DIAGNOSIS — Z953 Presence of xenogenic heart valve: Secondary | ICD-10-CM

## 2011-08-30 DIAGNOSIS — Z954 Presence of other heart-valve replacement: Secondary | ICD-10-CM

## 2011-08-30 DIAGNOSIS — I2699 Other pulmonary embolism without acute cor pulmonale: Secondary | ICD-10-CM

## 2011-09-01 ENCOUNTER — Encounter (HOSPITAL_COMMUNITY)
Admission: RE | Admit: 2011-09-01 | Discharge: 2011-09-01 | Disposition: A | Discharge status: Home / Self Care | Payer: BC Managed Care – PPO | Source: Ambulatory Visit | Attending: Cardiology | Admitting: Cardiology

## 2011-09-03 ENCOUNTER — Other Ambulatory Visit: Payer: Self-pay | Admitting: Cardiology

## 2011-09-04 ENCOUNTER — Telehealth: Payer: Self-pay | Admitting: Cardiology

## 2011-09-04 ENCOUNTER — Encounter (HOSPITAL_COMMUNITY)
Admission: RE | Admit: 2011-09-04 | Discharge: 2011-09-04 | Disposition: A | Discharge status: Home / Self Care | Payer: BC Managed Care – PPO | Source: Ambulatory Visit | Attending: Cardiology | Admitting: Cardiology

## 2011-09-04 NOTE — Telephone Encounter (Signed)
New problem:  Patient calling aware that Stanton Kidney is off today. Would like only to speak with Stanton Kidney due to she only knowing her history. Regarding cardiac rehab.

## 2011-09-04 NOTE — Progress Notes (Signed)
Pt requested time to talk to RN about her concern regarding returning to work on July 17th.  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 With pt work schedule 7-3 Monday thru Friday, pt would be unable to complete the full 12 week program. Pt feels she would benefit from continued monitoring of her heart due to recent Afib admission, educational classes and support physically and mentally. Pt left message for Debra, Dr. Jens Som nurse with her concerns.

## 2011-09-05 ENCOUNTER — Encounter: Payer: Self-pay | Admitting: *Deleted

## 2011-09-05 ENCOUNTER — Telehealth: Payer: Self-pay | Admitting: Cardiology

## 2011-09-05 NOTE — Telephone Encounter (Signed)
Out of work until fu ov in August Terri Edwards

## 2011-09-05 NOTE — Telephone Encounter (Signed)
Spoke with pt, letter will be placed at the front desk for her to pick up tomorrow.

## 2011-09-05 NOTE — Telephone Encounter (Signed)
Spoke with pt, she is due to return to work per dr Cornelius Moras on July 17th. Due to the complications from the surgery she is questioning if she should return that soon. She cont to do rehab and it will not be done until august. 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 She has a follow up appt with dr Jens Som august 23rd. She wants to know if she should be out of work until he sees her. Will forward for dr Jens Som review

## 2011-09-05 NOTE — Telephone Encounter (Signed)
Pt calling re going back to work and has some questions

## 2011-09-05 NOTE — Telephone Encounter (Signed)
Left message for pt to call back  °

## 2011-09-06 ENCOUNTER — Encounter (HOSPITAL_COMMUNITY)
Admission: RE | Admit: 2011-09-06 | Discharge: 2011-09-06 | Disposition: A | Discharge status: Home / Self Care | Payer: BC Managed Care – PPO | Source: Ambulatory Visit | Attending: Cardiology | Admitting: Cardiology

## 2011-09-06 NOTE — Progress Notes (Signed)
Terri Edwards 61 y.o. female Nutrition Note Spoke with pt.  Nutrition Survey reviewed with pt. Pt is following Step 1 of the Therapeutic Lifestyle Changes diet. Weight loss tips discussed. Hemoglobin A1c/Pre-diabetes discussed. Pt is taking Coumadin. Consistent Vitamin K intake reviewed. Pt expressed understanding of above.   Nutrition Diagnosis   Food-and nutrition-related knowledge deficit related to lack of exposure to information as related to diagnosis of: ? CVD ? Pre-DM (A1c 5.8)    Obesity related to excessive energy intake as evidenced by a BMI of 33.5  Nutrition RX/ Estimated Daily Nutrition Needs for: wt loss  1250-1500 Kcal, 30-40 gm fat, 9-11 gm sat fat, 1.2-1.5 gm trans-fat, <1500 mg sodium  Nutrition Intervention   Pt's individual nutrition plan including cholesterol goals reviewed with pt.   Benefits of adopting Therapeutic Lifestyle Changes discussed when Medficts reviewed.   Pt to attend the Portion Distortion class   Pt to attend the  ? Nutrition I class                     ? Nutrition II class   Pt given handouts for: ? wt loss ? Consistent vit K diet ? nutrition and tobacco cessation   Continue client-centered nutrition education by RD, as part of interdisciplinary care. Goal(s)   Pt to identify and limit food sources of saturated fat, trans fat, and cholesterol   Pt to identify food quantities necessary to achieve: ? wt loss to a goal wt of 154-166 lb (70-75.5 kg) at graduation from cardiac rehab.    Pt to describe the benefit of including fruits, vegetables, whole grains, and low-fat dairy products in a heart healthy meal plan.   Pt able to name foods rich in vitamin K. (Pt taking Coumadin/Warfarin). Monitor and Evaluate progress toward nutrition goal with team.

## 2011-09-08 ENCOUNTER — Encounter (HOSPITAL_COMMUNITY)
Admission: RE | Admit: 2011-09-08 | Discharge: 2011-09-08 | Disposition: A | Discharge status: Home / Self Care | Payer: BC Managed Care – PPO | Source: Ambulatory Visit | Attending: Cardiology | Admitting: Cardiology

## 2011-09-10 ENCOUNTER — Other Ambulatory Visit: Payer: Self-pay | Admitting: Physician Assistant

## 2011-09-11 ENCOUNTER — Encounter (HOSPITAL_COMMUNITY)
Admission: RE | Admit: 2011-09-11 | Discharge: 2011-09-11 | Disposition: A | Discharge status: Home / Self Care | Payer: BC Managed Care – PPO | Source: Ambulatory Visit | Attending: Cardiology | Admitting: Cardiology

## 2011-09-12 ENCOUNTER — Other Ambulatory Visit: Payer: Self-pay | Admitting: Cardiology

## 2011-09-12 ENCOUNTER — Other Ambulatory Visit: Payer: Self-pay | Admitting: Physician Assistant

## 2011-09-12 MED ORDER — METOPROLOL SUCCINATE ER 25 MG PO TB24: 25.0000 mg | ORAL_TABLET | Freq: Every day | ORAL | Status: DC

## 2011-09-13 ENCOUNTER — Encounter (HOSPITAL_COMMUNITY)
Admission: RE | Admit: 2011-09-13 | Discharge: 2011-09-13 | Disposition: A | Discharge status: Home / Self Care | Payer: BC Managed Care – PPO | Source: Ambulatory Visit | Attending: Cardiology | Admitting: Cardiology

## 2011-09-13 ENCOUNTER — Telehealth: Payer: Self-pay | Admitting: Cardiology

## 2011-09-13 MED ORDER — METOPROLOL SUCCINATE ER 25 MG PO TB24: 25.0000 mg | ORAL_TABLET | Freq: Every day | ORAL | Status: DC

## 2011-09-13 NOTE — Telephone Encounter (Signed)
F/u Refill - metoprolol succinate (TOPROL-XL) 25 MG 24 hr tablet  Patient called to f/u on refill, she ckd with pharmacy & RX was not available.

## 2011-09-15 ENCOUNTER — Encounter (HOSPITAL_COMMUNITY)
Admission: RE | Admit: 2011-09-15 | Discharge: 2011-09-15 | Disposition: A | Discharge status: Home / Self Care | Payer: BC Managed Care – PPO | Source: Ambulatory Visit | Attending: Cardiology | Admitting: Cardiology

## 2011-09-15 NOTE — Progress Notes (Signed)
Terri Edwards 61 y.o. female Nutrition Note Spoke with pt.  Nutrition Plan and cholesterol goals reviewed. Pt expressed understanding.  Nutrition Diagnosis   Food-and nutrition-related knowledge deficit related to lack of exposure to information as related to diagnosis of: ? CVD ? Pre-DM (A1c 5.8)    Obesity related to excessive energy intake as evidenced by a BMI of 33.5  Nutrition RX/ Estimated Daily Nutrition Needs for: wt loss  1250-1500 Kcal, 30-40 gm fat, 9-11 gm sat fat, 1.2-1.5 gm trans-fat, <1500 mg sodium  Nutrition Intervention   Pt's individual nutrition plan including cholesterol goals reviewed with pt.   Pt to attend the Portion Distortion class   Pt to attend the  ? Nutrition I class                     ? Nutrition II class   Continue client-centered nutrition education by RD, as part of interdisciplinary care. Goal(s)   Pt to identify and limit food sources of saturated fat, trans fat, and cholesterol   Pt to identify food quantities necessary to achieve: ? wt loss to a goal wt of 154-166 lb (70-75.5 kg) at graduation from cardiac rehab.    Pt to describe the benefit of including fruits, vegetables, whole grains, and low-fat dairy products in a heart healthy meal plan.   Pt able to name foods rich in vitamin K. (Pt taking Coumadin/Warfarin). Monitor and Evaluate progress toward nutrition goal with team.

## 2011-09-15 NOTE — Progress Notes (Signed)
Terri Edwards said she has times at home when she feels lightheaded but denied any symptoms  At cardiac rehab.  Orthostatic blood pressures checked 118/60 lying 118/70 sitting. Terri Edwards did report feeling a little light headed with the change of position.  Standing blood pressure 124/70.  Patient was noted to have nonsustained atrial fibrillation on the treadmill. Terri Edwards was asymptomatic.  Exit heart rate 86 Sinus Rhythm. Terri Repress NP called and notified. Terri Edwards said that Terri Edwards is okay to go home but needs to follow uip with the PA next week. Appointment obtained for Terri Edwards to see Terri Edwards [facility] on Monday at 2:00pm. I asked Terri Edwards to hold off from returning to exercise until she see's Terri Edwards. Terri Edwards admitted to drinking regular coffee and a regular Pepsi before coming to exercise. Advised Terri Edwards to avoid Caffeine. Terri Edwards left cardiac rehab without complaints. . Will send exercise flow sheets for review via Careers information officer.

## 2011-09-18 ENCOUNTER — Encounter (HOSPITAL_COMMUNITY): Payer: BC Managed Care – PPO

## 2011-09-18 ENCOUNTER — Ambulatory Visit (INDEPENDENT_AMBULATORY_CARE_PROVIDER_SITE_OTHER): Payer: BC Managed Care – PPO | Admitting: *Deleted

## 2011-09-18 ENCOUNTER — Ambulatory Visit (INDEPENDENT_AMBULATORY_CARE_PROVIDER_SITE_OTHER): Payer: BC Managed Care – PPO | Admitting: Physician Assistant

## 2011-09-18 ENCOUNTER — Encounter: Payer: Self-pay | Admitting: Physician Assistant

## 2011-09-18 VITALS — BP 108/56 | HR 83 | Ht 61.0 in | Wt 184.0 lb

## 2011-09-18 DIAGNOSIS — Z953 Presence of xenogenic heart valve: Secondary | ICD-10-CM

## 2011-09-18 DIAGNOSIS — G4730 Sleep apnea, unspecified: Secondary | ICD-10-CM

## 2011-09-18 DIAGNOSIS — I4891 Unspecified atrial fibrillation: Secondary | ICD-10-CM

## 2011-09-18 DIAGNOSIS — I2699 Other pulmonary embolism without acute cor pulmonale: Secondary | ICD-10-CM

## 2011-09-18 DIAGNOSIS — Z954 Presence of other heart-valve replacement: Secondary | ICD-10-CM

## 2011-09-18 DIAGNOSIS — I359 Nonrheumatic aortic valve disorder, unspecified: Secondary | ICD-10-CM

## 2011-09-18 DIAGNOSIS — I10 Essential (primary) hypertension: Secondary | ICD-10-CM

## 2011-09-18 DIAGNOSIS — Z7901 Long term (current) use of anticoagulants: Secondary | ICD-10-CM

## 2011-09-18 LAB — HEPATIC FUNCTION PANEL
Alkaline Phosphatase: 82 U/L (ref 39–117)
Bilirubin, Direct: 0.1 mg/dL (ref 0.0–0.3)

## 2011-09-18 MED ORDER — AMIODARONE HCL 200 MG PO TABS: 200.0000 mg | ORAL_TABLET | Freq: Every day | ORAL | Status: DC

## 2011-09-18 MED ORDER — TRAMADOL HCL 50 MG PO TABS: 50.0000 mg | ORAL_TABLET | Freq: Two times a day (BID) | ORAL | Status: AC | PRN

## 2011-09-18 MED ORDER — METOPROLOL SUCCINATE ER 25 MG PO TB24
25.0000 mg | ORAL_TABLET | Freq: Two times a day (BID) | ORAL | Status: DC
Start: 2011-09-18 — End: 2012-09-06

## 2011-09-18 NOTE — Patient Instructions (Addendum)
YOU HAVE BEEN GIVEN A WRITTEN PRESCRIPTION FOR AMOXICILLIN FOR DENTAL PROCEDURES  INCREASE TOPROL TO 25 MG 1 TABLET EVERY 12 HOURS (IE 8 AM AND 8 PM)  A REFILL FOR TRAMADOL HAS BEEN SENT IN TODAY  A REFILL HAS BEEN SENT IN TODAY FOR AMIODARONE AS WELL  PER SCOTT WEAVER, PAC IT IS OK FOR YOU TO RESUME CARDIAC REHAB  KEEP 11/10/11 APPT WITH DR. CRENSHAW  Your physician recommends that you return for lab work in: TODAY TSH, LFT

## 2011-09-18 NOTE — Progress Notes (Signed)
[address] [place], [place]  [address] Phone: [phone] Fax:  [phone]  Date:  09/18/2011   Name:  Terri Edwards   DOB:  November 07, 1950   MRN:  [id number]  PCP:  Carylon Perches, MD  Primary Cardiologist:  Dr. Olga Millers  Primary Electrophysiologist:  None    History of Present Illness: Terri Edwards is a 61 y.o. female who returns today after developing paroxysmal atrial fibrillation while at cardiac rehab.  She has a history of aortic stenosis, status post Pericardial tissue aortic valve replacement in April 2013.  LHC in 06/2011 demonstrated no coronary artery disease.  She was readmitted to the hospital later that month with atrial fibrillation with rapid ventricular rate as well as bilateral pulmonary emboli.  She was placed on Coumadin.  She was also placed on amiodarone.  She was also treated for pneumonia.  Echo 07/14/11:  Mod LVH, EF 55-60%, grade 2 diast dysfxn, AVR mean gradient 21 mmHg, mod to severe LAE, mild RVE, mod RAE, PASP 32.   Last saw Dr. Jens Som 07/28/11.  At that time, he felt that she would likely continue Coumadin for a total of 6 months and she could likely come off of amiodarone after 3 months.  It is felt that her atrial fibrillation was most likely related to her cardiac surgery.  She has been referred to pulmonary for possible sleep testing.    She was noted last week to go into AFib with a heart rate of 134 during exercise at cardiac rehabilitation.  I received notifiication from the nurse who noted that the patient had complained of some lightheadedness recently.  She obtained orthostatic vital signs and they were reportedly normal.  She was asymptomatic with her AFib.  Post-exercise, she returned to sinus rhythm.  I have reviewed telemetry strips that have been made available from cardiac rehabilitation.  She was added on my schedule today for further evaluation.    She has overall felt well.  Has occasional lightheadedness.  No  near syncope or syncope.  No chest pain.  No orthopnea, PND.  No edema.  Still notes DOE.  Reports Class II symptoms.  Breathing is slowly getting better.  She denies palpitations.    Wt Readings from Last 3 Encounters:  09/18/11 184 lb (83.462 kg)  08/10/11 178 lb 9.2 oz (81 kg)  07/31/11 184 lb (83.462 kg)     Potassium  Date/Time Value Range Status  07/28/2011  9:27 AM 3.9  3.5 - 5.1 mEq/L Final     Creatinine, Ser  Date/Time Value Range Status  07/28/2011  9:27 AM 1.0  0.4 - 1.2 mg/dL Final     ALT  Date/Time Value Range Status  07/28/2011  9:27 AM 21  0 - 35 U/L Final     TSH  Date/Time Value Range Status  07/13/2011  4:57 PM 2.737  0.350 - 4.500 uIU/mL Final    Past Medical History  Diagnosis Date  . HTN (hypertension), benign   . Hypercholesteremia   . Bell's palsy 2005  . Obesity (BMI 30.0-34.9) 06/29/2011  . Aortic stenosis   . GERD (gastroesophageal reflux disease)   . Arthritis   . S/P aortic valve replacement with bioprosthetic valve 07/05/2011    21mm [facility] Ease pericardial tissue valve  . Atrial fibrillation     Post operative following AVR  . Pulmonary embolus     following AVR    Current Outpatient Prescriptions  Medication Sig Dispense Refill  .  amiodarone (PACERONE) 200 MG tablet Take 1 tablet (200 mg total) by mouth daily.  30 tablet  3  . metoprolol succinate (TOPROL-XL) 25 MG 24 hr tablet Take 1 tablet (25 mg total) by mouth daily.  30 tablet  12  . neomycin-bacitracin-polymyxin (NEOSPORIN) OINT Apply 1 application topically daily. Apply to sternal incision      . pantoprazole (PROTONIX) 40 MG tablet Take 40 mg by mouth daily as needed. For reflux      . pravastatin (PRAVACHOL) 40 MG tablet Take 40 mg by mouth daily.      Marland Kitchen warfarin (COUMADIN) 2 MG tablet Take 1 tablet (2 mg total) by mouth daily at 6 PM.  30 tablet  3    Allergies: Allergies  Allergen Reactions  . Nickel     Rash blisters  . Sulfa Antibiotics     Blisters  Rash     . Tape     Slight skin irritation    History  Substance Use Topics  . Smoking status: Former Smoker -- 1.0 packs/day for 45 years    Types: Cigarettes    Quit date: 07/04/2011  . Smokeless tobacco: Never Used  . Alcohol Use: No     ROS:  Please see the history of present illness.   Notes a low grade temp 2 weeks ago.  This has resolved.  Also has non-productive cough.    All other systems reviewed and negative.   PHYSICAL EXAM: VS:  BP 108/56  Pulse 83  Ht 5\' 1"  (1.549 m)  Wt 184 lb (83.462 kg)  BMI 34.77 kg/m2  LMP 04/29/1991 Well nourished, well developed, in no acute distress HEENT: normal Neck: no JVD Endocrine: no thyromegaly Cardiac:  normal S1, S2; RRR; no murmur Lungs:  clear to auscultation bilaterally, no wheezing, rhonchi or rales Abd: soft, nontender, no hepatomegaly Ext: no edema Skin: warm and dry Neuro:  CNs 2-12 intact, no focal abnormalities noted  EKG:  NSR, HR 83, normal axis, no change from prior tracing   ASSESSMENT AND PLAN:  1.  Atrial Fibrillation She has had one episode of paroxysmal AFib.  This was asymptomatic and short lived. She is over 2 mos out from her AVR.  Suspect this episode of AFib is not entirely related to post-operative changes. CHADS2-VASc score is 3 (HTN, female, mild carotid stenosis).  Suspect she will likely be a long-term coumadin candidate. We discussed this today.   She did return to NSR quickly with amiodarone.   Would continue current dose of amiodarone as she has been on this for 2 mos now. I will adjust Toprol to 25 mg bid. Continue coumadin.   Check TSH and LFTs today. She can return to Cardiac Rehab. Keep follow up with Dr. Olga Millers in August as planned.  2.  Aortic stenosis, status post aortic valve replacement 06/2011 She is still having occasional pain and requests refill on Tramadol.  Will refill Tramadol 50 mg bid prn, #30, no refills. She may need dental work soon.  I have given her a Rx for  Amoxicillin 2 grams po 30-60 mins before dental work.  3.  Bilateral pulmonary emboli Continue coumadin for at least 6 mos.  4.  Hypertension Controlled.  5.  Hyperlipidemia Continue current Rx.  6.  Sleep apnea She has not arranged an appt with pulmonology. I have explained the importance of treating OSA in the context of AFib. She still prefers to hold off on this for now.   Signed, Lorin Picket  Montie Gelardi, PA-C  1:45 PM 09/18/2011

## 2011-09-20 ENCOUNTER — Encounter (HOSPITAL_COMMUNITY)
Admission: RE | Admit: 2011-09-20 | Discharge: 2011-09-20 | Disposition: A | Discharge status: Home / Self Care | Payer: BC Managed Care – PPO | Source: Ambulatory Visit | Attending: Cardiology | Admitting: Cardiology

## 2011-09-20 DIAGNOSIS — I4891 Unspecified atrial fibrillation: Secondary | ICD-10-CM | POA: Insufficient documentation

## 2011-09-20 MED ORDER — AMIODARONE HCL 200 MG PO TABS: 200.0000 mg | ORAL_TABLET | Freq: Every day | ORAL | Status: DC

## 2011-09-20 NOTE — Progress Notes (Signed)
Pt returned to rehab per Tereso Newcomer PA.  Pt with medication change.  Toprol XL 25 mg twice a day.  Pt monitor showed SR.

## 2011-09-22 ENCOUNTER — Encounter (HOSPITAL_COMMUNITY)
Admission: RE | Admit: 2011-09-22 | Discharge: 2011-09-22 | Disposition: A | Discharge status: Home / Self Care | Payer: BC Managed Care – PPO | Source: Ambulatory Visit | Attending: Cardiology | Admitting: Cardiology

## 2011-09-25 ENCOUNTER — Ambulatory Visit: Payer: BC Managed Care – PPO | Admitting: Cardiology

## 2011-09-25 ENCOUNTER — Encounter (HOSPITAL_COMMUNITY)
Admission: RE | Admit: 2011-09-25 | Discharge: 2011-09-25 | Disposition: A | Discharge status: Home / Self Care | Payer: BC Managed Care – PPO | Source: Ambulatory Visit | Attending: Cardiology | Admitting: Cardiology

## 2011-09-27 ENCOUNTER — Encounter (HOSPITAL_COMMUNITY)
Admission: RE | Admit: 2011-09-27 | Discharge: 2011-09-27 | Disposition: A | Discharge status: Home / Self Care | Payer: BC Managed Care – PPO | Source: Ambulatory Visit | Attending: Cardiology | Admitting: Cardiology

## 2011-09-29 ENCOUNTER — Encounter (HOSPITAL_COMMUNITY)
Admission: RE | Admit: 2011-09-29 | Discharge: 2011-09-29 | Disposition: A | Discharge status: Home / Self Care | Payer: BC Managed Care – PPO | Source: Ambulatory Visit | Attending: Cardiology | Admitting: Cardiology

## 2011-10-02 ENCOUNTER — Encounter (HOSPITAL_COMMUNITY)
Admission: RE | Admit: 2011-10-02 | Discharge: 2011-10-02 | Disposition: A | Discharge status: Home / Self Care | Payer: BC Managed Care – PPO | Source: Ambulatory Visit | Attending: Cardiology | Admitting: Cardiology

## 2011-10-02 ENCOUNTER — Ambulatory Visit (INDEPENDENT_AMBULATORY_CARE_PROVIDER_SITE_OTHER): Payer: BC Managed Care – PPO | Admitting: *Deleted

## 2011-10-02 DIAGNOSIS — I2699 Other pulmonary embolism without acute cor pulmonale: Secondary | ICD-10-CM

## 2011-10-02 DIAGNOSIS — Z7901 Long term (current) use of anticoagulants: Secondary | ICD-10-CM

## 2011-10-02 DIAGNOSIS — Z954 Presence of other heart-valve replacement: Secondary | ICD-10-CM

## 2011-10-02 DIAGNOSIS — I4891 Unspecified atrial fibrillation: Secondary | ICD-10-CM

## 2011-10-02 DIAGNOSIS — Z953 Presence of xenogenic heart valve: Secondary | ICD-10-CM

## 2011-10-02 LAB — POCT INR: INR: 1.8

## 2011-10-02 MED ORDER — WARFARIN SODIUM 2 MG PO TABS: ORAL_TABLET | ORAL | Status: DC

## 2011-10-04 ENCOUNTER — Encounter (HOSPITAL_COMMUNITY)
Admission: RE | Admit: 2011-10-04 | Discharge: 2011-10-04 | Disposition: A | Discharge status: Home / Self Care | Payer: BC Managed Care – PPO | Source: Ambulatory Visit | Attending: Cardiology | Admitting: Cardiology

## 2011-10-06 ENCOUNTER — Encounter (HOSPITAL_COMMUNITY)
Admission: RE | Admit: 2011-10-06 | Discharge: 2011-10-06 | Disposition: A | Discharge status: Home / Self Care | Payer: BC Managed Care – PPO | Source: Ambulatory Visit | Attending: Cardiology | Admitting: Cardiology

## 2011-10-09 ENCOUNTER — Encounter (HOSPITAL_COMMUNITY)
Admission: RE | Admit: 2011-10-09 | Discharge: 2011-10-09 | Disposition: A | Discharge status: Home / Self Care | Payer: BC Managed Care – PPO | Source: Ambulatory Visit | Attending: Cardiology | Admitting: Cardiology

## 2011-10-11 ENCOUNTER — Encounter (HOSPITAL_COMMUNITY)
Admission: RE | Admit: 2011-10-11 | Discharge: 2011-10-11 | Disposition: A | Discharge status: Home / Self Care | Payer: BC Managed Care – PPO | Source: Ambulatory Visit | Attending: Cardiology | Admitting: Cardiology

## 2011-10-13 ENCOUNTER — Encounter (HOSPITAL_COMMUNITY)
Admission: RE | Admit: 2011-10-13 | Discharge: 2011-10-13 | Disposition: A | Discharge status: Home / Self Care | Payer: BC Managed Care – PPO | Source: Ambulatory Visit | Attending: Cardiology | Admitting: Cardiology

## 2011-10-16 ENCOUNTER — Ambulatory Visit (INDEPENDENT_AMBULATORY_CARE_PROVIDER_SITE_OTHER): Payer: BC Managed Care – PPO

## 2011-10-16 ENCOUNTER — Encounter (HOSPITAL_COMMUNITY)
Admission: RE | Admit: 2011-10-16 | Discharge: 2011-10-16 | Disposition: A | Discharge status: Home / Self Care | Payer: BC Managed Care – PPO | Source: Ambulatory Visit | Attending: Cardiology | Admitting: Cardiology

## 2011-10-16 DIAGNOSIS — Z954 Presence of other heart-valve replacement: Secondary | ICD-10-CM

## 2011-10-16 DIAGNOSIS — Z7901 Long term (current) use of anticoagulants: Secondary | ICD-10-CM

## 2011-10-16 DIAGNOSIS — Z953 Presence of xenogenic heart valve: Secondary | ICD-10-CM

## 2011-10-16 DIAGNOSIS — I4891 Unspecified atrial fibrillation: Secondary | ICD-10-CM

## 2011-10-16 DIAGNOSIS — I2699 Other pulmonary embolism without acute cor pulmonale: Secondary | ICD-10-CM

## 2011-10-16 LAB — POCT INR: INR: 2

## 2011-10-18 ENCOUNTER — Encounter (HOSPITAL_COMMUNITY)
Admission: RE | Admit: 2011-10-18 | Discharge: 2011-10-18 | Disposition: A | Discharge status: Home / Self Care | Payer: BC Managed Care – PPO | Source: Ambulatory Visit | Attending: Cardiology | Admitting: Cardiology

## 2011-10-20 ENCOUNTER — Encounter (HOSPITAL_COMMUNITY)
Admission: RE | Admit: 2011-10-20 | Discharge: 2011-10-20 | Disposition: A | Discharge status: Home / Self Care | Payer: BC Managed Care – PPO | Source: Ambulatory Visit | Attending: Cardiology | Admitting: Cardiology

## 2011-10-20 DIAGNOSIS — I4891 Unspecified atrial fibrillation: Secondary | ICD-10-CM | POA: Insufficient documentation

## 2011-10-23 ENCOUNTER — Encounter (HOSPITAL_COMMUNITY)
Admission: RE | Admit: 2011-10-23 | Discharge: 2011-10-23 | Disposition: A | Discharge status: Home / Self Care | Payer: BC Managed Care – PPO | Source: Ambulatory Visit | Attending: Cardiology | Admitting: Cardiology

## 2011-10-25 ENCOUNTER — Encounter (HOSPITAL_COMMUNITY)
Admission: RE | Admit: 2011-10-25 | Discharge: 2011-10-25 | Disposition: A | Discharge status: Home / Self Care | Payer: BC Managed Care – PPO | Source: Ambulatory Visit | Attending: Cardiology | Admitting: Cardiology

## 2011-10-27 ENCOUNTER — Encounter (HOSPITAL_COMMUNITY)
Admission: RE | Admit: 2011-10-27 | Discharge: 2011-10-27 | Disposition: A | Discharge status: Home / Self Care | Payer: BC Managed Care – PPO | Source: Ambulatory Visit | Attending: Cardiology | Admitting: Cardiology

## 2011-10-30 ENCOUNTER — Encounter (HOSPITAL_COMMUNITY)
Admission: RE | Admit: 2011-10-30 | Discharge: 2011-10-30 | Disposition: A | Discharge status: Home / Self Care | Payer: BC Managed Care – PPO | Source: Ambulatory Visit | Attending: Cardiology | Admitting: Cardiology

## 2011-11-01 ENCOUNTER — Encounter (HOSPITAL_COMMUNITY)
Admission: RE | Admit: 2011-11-01 | Discharge: 2011-11-01 | Disposition: A | Discharge status: Home / Self Care | Payer: BC Managed Care – PPO | Source: Ambulatory Visit | Attending: Cardiology | Admitting: Cardiology

## 2011-11-03 ENCOUNTER — Encounter (HOSPITAL_COMMUNITY)
Admission: RE | Admit: 2011-11-03 | Discharge: 2011-11-03 | Disposition: A | Discharge status: Home / Self Care | Payer: BC Managed Care – PPO | Source: Ambulatory Visit | Attending: Cardiology | Admitting: Cardiology

## 2011-11-06 ENCOUNTER — Encounter (HOSPITAL_COMMUNITY)
Admission: RE | Admit: 2011-11-06 | Discharge: 2011-11-06 | Disposition: A | Discharge status: Home / Self Care | Payer: BC Managed Care – PPO | Source: Ambulatory Visit | Attending: Cardiology | Admitting: Cardiology

## 2011-11-06 ENCOUNTER — Ambulatory Visit (INDEPENDENT_AMBULATORY_CARE_PROVIDER_SITE_OTHER): Payer: BC Managed Care – PPO | Admitting: *Deleted

## 2011-11-06 ENCOUNTER — Ambulatory Visit: Payer: BC Managed Care – PPO | Admitting: Thoracic Surgery (Cardiothoracic Vascular Surgery)

## 2011-11-06 DIAGNOSIS — Z7901 Long term (current) use of anticoagulants: Secondary | ICD-10-CM

## 2011-11-06 DIAGNOSIS — Z954 Presence of other heart-valve replacement: Secondary | ICD-10-CM

## 2011-11-06 DIAGNOSIS — I2699 Other pulmonary embolism without acute cor pulmonale: Secondary | ICD-10-CM

## 2011-11-06 DIAGNOSIS — Z953 Presence of xenogenic heart valve: Secondary | ICD-10-CM

## 2011-11-06 DIAGNOSIS — I4891 Unspecified atrial fibrillation: Secondary | ICD-10-CM

## 2011-11-06 LAB — POCT INR: INR: 1.8

## 2011-11-08 ENCOUNTER — Encounter (HOSPITAL_COMMUNITY)
Admission: RE | Admit: 2011-11-08 | Discharge: 2011-11-08 | Disposition: A | Discharge status: Home / Self Care | Payer: BC Managed Care – PPO | Source: Ambulatory Visit | Attending: Cardiology | Admitting: Cardiology

## 2011-11-08 NOTE — Progress Notes (Signed)
Pt graduates today from Phase II Cardiac Rehab. Pt will continue home exercise with walking. Pt will see Dr. Jens Som on Friday.  Will scan rehab progress report.

## 2011-11-10 ENCOUNTER — Encounter: Payer: Self-pay | Admitting: *Deleted

## 2011-11-10 ENCOUNTER — Encounter: Payer: Self-pay | Admitting: Cardiology

## 2011-11-10 ENCOUNTER — Encounter (HOSPITAL_COMMUNITY): Payer: BC Managed Care – PPO

## 2011-11-10 ENCOUNTER — Ambulatory Visit (INDEPENDENT_AMBULATORY_CARE_PROVIDER_SITE_OTHER): Payer: BC Managed Care – PPO | Admitting: Cardiology

## 2011-11-10 VITALS — BP 147/80 | HR 69 | Ht 62.0 in | Wt 183.8 lb

## 2011-11-10 DIAGNOSIS — F17200 Nicotine dependence, unspecified, uncomplicated: Secondary | ICD-10-CM

## 2011-11-10 DIAGNOSIS — I2699 Other pulmonary embolism without acute cor pulmonale: Secondary | ICD-10-CM

## 2011-11-10 DIAGNOSIS — Z953 Presence of xenogenic heart valve: Secondary | ICD-10-CM

## 2011-11-10 DIAGNOSIS — I359 Nonrheumatic aortic valve disorder, unspecified: Secondary | ICD-10-CM

## 2011-11-10 DIAGNOSIS — E7800 Pure hypercholesterolemia, unspecified: Secondary | ICD-10-CM

## 2011-11-10 DIAGNOSIS — I10 Essential (primary) hypertension: Secondary | ICD-10-CM

## 2011-11-10 DIAGNOSIS — I4891 Unspecified atrial fibrillation: Secondary | ICD-10-CM

## 2011-11-10 NOTE — Progress Notes (Signed)
HPI: Terri Edwards is a 61 y.o. female for fu of AVR and atrial fibrillation. She has a history of aortic stenosis, status post Pericardial tissue aortic valve replacement in April 2013. LHC in 06/2011 demonstrated no coronary artery disease. She was readmitted to the hospital later that month with atrial fibrillation with rapid ventricular rate as well as bilateral pulmonary emboli. She was placed on Coumadin. She was also placed on amiodarone. Echo 07/14/11: Mod LVH, EF 55-60%, grade 2 diast dysfxn, AVR mean gradient 21 mmHg, mod to severe LAE, mild RVE, mod RAE, PASP 32.  Patient seen by Tereso Newcomer in July 2013 with episode of AFib with a heart rate of 134 during exercise at cardiac rehabilitation. Since then, she has some dyspnea on exertion but much improved. No orthopnea, PND or pedal edema. No chest pain palpitations or syncope.  Current Outpatient Prescriptions  Medication Sig Dispense Refill  . amiodarone (PACERONE) 200 MG tablet Take 1 tablet (200 mg total) by mouth daily.  30 tablet  6  . amoxicillin (AMOXIL) 500 MG capsule Take 4 tablets = 2 grams 30-60 minutes before dental procedures      . metoprolol succinate (TOPROL-XL) 25 MG 24 hr tablet Take 1 tablet (25 mg total) by mouth every 12 (twelve) hours.  60 tablet  11  . pantoprazole (PROTONIX) 40 MG tablet Take 40 mg by mouth daily as needed. For reflux      . pravastatin (PRAVACHOL) 40 MG tablet Take 40 mg by mouth daily.      Marland Kitchen warfarin (COUMADIN) 2 MG tablet Take as directed by coumadin clinic  30 tablet  3     Past Medical History  Diagnosis Date  . HTN (hypertension), benign   . Hypercholesteremia   . Bell's palsy 2005  . Obesity (BMI 30.0-34.9) 06/29/2011  . Aortic stenosis   . GERD (gastroesophageal reflux disease)   . Arthritis   . S/P aortic valve replacement with bioprosthetic valve 07/05/2011    21mm [facility] Ease pericardial tissue valve  . Atrial fibrillation     Post operative following AVR  .  Pulmonary embolus     following AVR    Past Surgical History  Procedure Date  . Knee arthroscopy lft  . Cataract extraction     bil  . 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 Aortic valve replacement 07/05/2011    Procedure: AORTIC VALVE REPLACEMENT (AVR);  Surgeon: Purcell Nails, MD;  Location: [facility] OR;  Service: Open Heart Surgery;  Laterality: N/A;    History   Social History  . Marital Status: Married    Spouse Name: N/A    Number of Children: N/A  . 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 Smoking status: Former Smoker -- 1.0 packs/day for 45 years    Types: Cigarettes    Quit date: 07/04/2011  . Smokeless tobacco: Never Used  . Alcohol Use: No  . Drug Use: Not on file  . Sexually Active: Yes    Birth Control/ Protection: Post-menopausal   Other Topics Concern  . Not on file   Social History Narrative  . No narrative on file    ROS: no fevers or chills, productive cough, hemoptysis, dysphasia, odynophagia, melena, hematochezia, dysuria, hematuria, rash, seizure activity, orthopnea, PND, pedal edema, claudication. Remaining systems are negative.  Physical Exam: Well-developed well-nourished in no acute distress.  Skin is warm  and dry.  HEENT is normal.  Neck is supple.  Chest is clear to auscultation with normal expansion.  Cardiovascular exam is regular rate and rhythm. 2/6 systolic murmur left sternal border. Abdominal exam nontender or distended. No masses palpated. Extremities show no edema. neuro grossly intact  ECG sinus rhythm at a rate of 66. Nonspecific ST changes.

## 2011-11-10 NOTE — Assessment & Plan Note (Signed)
Patient remains in sinus rhythm. I initially felt that her atrial fibrillation was most likely related to her aortic valve replacement. However she had a documented episode 3 months following her procedure and converted spontaneously. I therefore think she has paroxysmal atrial fibrillation. However the majority appear to happen at the time of her surgery and pulmonary embolus. I will discontinue amiodarone. Hopefully she will maintain sinus rhythm. She has embolic risk factors of hypertension and female sex. She is nearing 61 years of age. I would tend to continue Coumadin indefinitely.

## 2011-11-10 NOTE — Assessment & Plan Note (Signed)
Continue SBE prophylaxis. 

## 2011-11-10 NOTE — Assessment & Plan Note (Signed)
Continue Coumadin for a full 6 months following her pulmonary embolus. This was related to her surgery.

## 2011-11-10 NOTE — Assessment & Plan Note (Signed)
Patient discontinued her tobacco use 5 months ago.

## 2011-11-10 NOTE — Patient Instructions (Addendum)
Your physician wants you to follow-up in: 6 MONTHS WITH DR CRENSHAW You will receive a reminder letter in the mail two months in advance. If you don't receive a letter, please call our office to schedule the follow-up appointment.   STOP AMIODARONE 

## 2011-11-10 NOTE — Assessment & Plan Note (Signed)
Management per primary care. 

## 2011-11-10 NOTE — Assessment & Plan Note (Signed)
Continue present medications and followup.

## 2011-11-13 ENCOUNTER — Encounter (HOSPITAL_COMMUNITY): Payer: BC Managed Care – PPO

## 2011-11-15 ENCOUNTER — Encounter (HOSPITAL_COMMUNITY): Payer: BC Managed Care – PPO

## 2011-11-17 ENCOUNTER — Encounter (HOSPITAL_COMMUNITY): Payer: BC Managed Care – PPO

## 2011-11-20 ENCOUNTER — Encounter (HOSPITAL_COMMUNITY): Payer: BC Managed Care – PPO

## 2011-11-24 NOTE — Addendum Note (Signed)
Addended by: Brenan Modesto, Bartolo Darter D on: 11/24/2011 02:53 PM   Modules accepted: Orders

## 2011-11-27 ENCOUNTER — Ambulatory Visit (INDEPENDENT_AMBULATORY_CARE_PROVIDER_SITE_OTHER): Payer: PRIVATE HEALTH INSURANCE

## 2011-11-27 DIAGNOSIS — I4891 Unspecified atrial fibrillation: Secondary | ICD-10-CM

## 2011-11-27 DIAGNOSIS — Z954 Presence of other heart-valve replacement: Secondary | ICD-10-CM

## 2011-11-27 DIAGNOSIS — I2699 Other pulmonary embolism without acute cor pulmonale: Secondary | ICD-10-CM

## 2011-11-27 DIAGNOSIS — Z7901 Long term (current) use of anticoagulants: Secondary | ICD-10-CM

## 2011-11-27 DIAGNOSIS — Z953 Presence of xenogenic heart valve: Secondary | ICD-10-CM

## 2011-11-27 LAB — POCT INR: INR: 2.2

## 2011-12-04 ENCOUNTER — Encounter: Payer: Self-pay | Admitting: Thoracic Surgery (Cardiothoracic Vascular Surgery)

## 2011-12-04 ENCOUNTER — Ambulatory Visit (INDEPENDENT_AMBULATORY_CARE_PROVIDER_SITE_OTHER): Payer: PRIVATE HEALTH INSURANCE | Admitting: Thoracic Surgery (Cardiothoracic Vascular Surgery)

## 2011-12-04 VITALS — BP 172/80 | HR 69 | Resp 16 | Ht 62.5 in | Wt 188.0 lb

## 2011-12-04 DIAGNOSIS — Z953 Presence of xenogenic heart valve: Secondary | ICD-10-CM

## 2011-12-04 DIAGNOSIS — Z954 Presence of other heart-valve replacement: Secondary | ICD-10-CM

## 2011-12-04 NOTE — Progress Notes (Signed)
[address]            [name] [address]          [phone]     CARDIOTHORACIC SURGERY OFFICE NOTE  Referring Provider is Olga Millers, MD PCP is Carylon Perches, MD   HPI:  Patient returns for followup status post aortic valve replacement using a bioprosthetic tissue valve on 07/05/2011.  She initially did well but was reviewed minutes the hospital with paroxysmal atrial fibrillation and pulmonary embolus several weeks later. She is been anticoagulated on Coumadin ever since. Over the last couple months she has done very well. She apparently did have a brief episode of paroxysmal atrial fibrillation in July while she was a cardiac rehabilitation. She has otherwise remained in sinus rhythm. She now reports her exercise tolerance is quite good. She has no shortness of breath. She still has some mild soreness across her chest but this does not seem to bother her much at all. She has not had any problems maintaining Coumadin therapy. The remainder of her review of systems unremarkable.   Current Outpatient Prescriptions  Medication Sig Dispense Refill  . amoxicillin (AMOXIL) 500 MG capsule Take 4 tablets = 2 grams 30-60 minutes before dental procedures      . metoprolol succinate (TOPROL-XL) 25 MG 24 hr tablet Take 1 tablet (25 mg total) by mouth every 12 (twelve) hours.  60 tablet  11  . pantoprazole (PROTONIX) 40 MG tablet Take 40 mg by mouth daily as needed. For reflux      . pravastatin (PRAVACHOL) 40 MG tablet Take 40 mg by mouth daily.      Marland Kitchen warfarin (COUMADIN) 2 MG tablet Take as directed by coumadin clinic  30 tablet  3      Physical Exam:   BP 172/80  Pulse 69  Resp 16  Ht 5' 2.5" (1.588 m)  Wt 188 lb (85.276 kg)  BMI 33.84 kg/m2  SpO2 97%  LMP 04/29/1991  General:  Well-appearing  Chest:   Clear to auscultation with symmetrical breath sounds  CV:   Regular rate and rhythm  Incisions:  Completely healed with stable sternum on  palpation  Abdomen:  Soft and nontender  Extremities:  Warm and well-perfused  Diagnostic Tests:  Transthoracic Echocardiography  Patient:    Terri Edwards, Terri Edwards MR #:       [id number] Study Date: 07/14/2011 Gender:     F Age:        61 Height:     157.5cm Weight:     86.6kg BSA:        1.78m^2 Pt. Status: Room:       2603    ADMITTING    Olga Millers, MD, [facility]  ATTENDING    Olga Millers, MD, [facility]  PERFORMING   [place], [facility], [place]  SONOGRAPHER  Jeryl Columbia cc:  ------------------------------------------------------------ LV EF: 55% -   60%  ------------------------------------------------------------ Indications:      Atrial fibrillation - 427.31.  ------------------------------------------------------------ History:   PMH:  Aortic valve replacement 07/05/11- calf valve, Bell's Palsy  Risk factors:  Current tobacco use. Hypertension.  ------------------------------------------------------------ Study Conclusions  - Left ventricle: The cavity size was normal. Wall thickness   was increased in a pattern of moderate LVH. Systolic   function was normal. The estimated ejection fraction was   in the range of 55%  to 60%. Features are consistent with a   pseudonormal left ventricular filling pattern, with   concomitant abnormal relaxation and increased filling   pressure (grade 2 diastolic dysfunction). - Aortic valve: AV prosthesis appears to open well. Peak and   mean gradients through the valve are 35 and 21 mm Hg   respectively. - Left atrium: The atrium was moderately to severely   dilated. - Right ventricle: The cavity size was mildly dilated. - Right atrium: The atrium was moderately dilated. - Pulmonary arteries: PA peak pressure: 32mm Hg (S). Transthoracic echocardiography.  M-mode, complete 2D, spectral Doppler, and color Doppler.  Height:  Height: 157.5cm. Height: 62in.  Weight:  Weight: 86.6kg. Weight: 190.6lb.  Body mass  index:  BMI: 34.9kg/m^2.  Body surface area:    BSA: 1.3m^2.  Blood pressure:     106/81.  Patient status:  Inpatient.  Location:  Bedside.  ------------------------------------------------------------  ------------------------------------------------------------ Left ventricle:  The cavity size was normal. Wall thickness was increased in a pattern of moderate LVH. Systolic function was normal. The estimated ejection fraction was in the range of 55% to 60%. Features are consistent with a pseudonormal left ventricular filling pattern, with concomitant abnormal relaxation and increased filling pressure (grade 2 diastolic dysfunction).  ------------------------------------------------------------ Aortic valve:  AV prosthesis appears to open well. Peak and mean gradients through the valve are 35 and 21 mm Hg respectively.  Mildly thickened, mildly calcified leaflets. Doppler:     Mean gradient: 16mm Hg (S). Peak gradient: 24mm Hg (S).  ------------------------------------------------------------ Mitral valve:   Structurally normal valve.   Leaflet separation was normal.  Doppler:  Transvalvular velocity was within the normal range. There was no evidence for stenosis.  Trivial regurgitation.    Peak gradient: 6mm Hg (D).  ------------------------------------------------------------ Left atrium:  The atrium was moderately to severely dilated.   ------------------------------------------------------------ Right ventricle:  The cavity size was mildly dilated. Systolic function was normal.  ------------------------------------------------------------ Pulmonic valve:    Mildly thickened leaflets. Cusp separation was normal.  Doppler:  Transvalvular velocity was within the normal range.  No regurgitation.  ------------------------------------------------------------ Tricuspid valve:   Structurally normal valve.   Leaflet separation was normal.  Doppler:  Transvalvular velocity  was within the normal range.  Mild regurgitation.  ------------------------------------------------------------ Right atrium:  The atrium was moderately dilated.  ------------------------------------------------------------ Pericardium:  There was no pericardial effusion.  ------------------------------------------------------------ Systemic veins: Inferior vena cava: The vessel was mildly dilated; the respirophasic diameter changes were in the normal range (= 50%).  ------------------------------------------------------------  2D measurements        Normal  Doppler               Normal Left ventricle                 measurements LVID ED,   37.3 mm     43-52   Main pulmonary chord,                         artery PLAX                           Pressure, S    32 mm  =30 LVID ES,   27.7 mm     23-38                     Hg chord,  Left ventricle PLAX                           Ea, lat      9.76 cm/ ------- FS, chord,   26 %      >29     ann, tiss         s PLAX                           DP LVPW, ED   16.9 mm     ------  E/Ea, lat   12.81     ------- IVS/LVPW    0.8        <1.3    ann, tiss ratio, ED                      DP Ventricular septum             Ea, med      5.81 cm/ ------- IVS, ED    13.5 mm     ------  ann, tiss         s LVOT                           DP Diam, S      17 mm     ------  E/Ea, med   21.51     ------- Area       2.27 cm^2   ------  ann, tiss Aorta                          DP Root diam,   20 mm     ------  LVOT ED                             Peak vel, S   113 cm/ ------- Left atrium                                      s AP dim       49 mm     ------  VTI, S       21.7 cm  ------- AP dim     2.62 cm/m^2 <2.2    Peak            5 mm  ------- index                          gradient, S       Hg                                Aortic valve                                Peak vel, S   247 cm/ -------  s                                Mean vel, S   175 cm/ -------                                                  s                                VTI, S       48.2 cm  -------                                Mean           16 mm  -------                                gradient, S       Hg                                Peak           24 mm  -------                                gradient, S       Hg                                Mitral valve                                Peak E vel    125 cm/ -------                                                  s                                Peak A vel   72.6 cm/ -------                                                  s                                Deceleratio   218 ms  150-230                                n time  Peak            6 mm  -------                                gradient, D       Hg                                Peak E/A      1.7     -------                                ratio                                Tricuspid valve                                Regurg peak   261 cm/ -------                                vel               s                                Peak RV-RA     27 mm  -------                                gradient, S       Hg                                Systemic veins                                Estimated       5 mm  -------                                CVP               Hg                                Right ventricle                                Pressure, S    32 mm  <30                                                  Hg   ------------------------------------------------------------ Prepared and Electronically Authenticated by  Dietrich Pates, MD 2013-04-26T14:45:[id number]    Impression:  The patient is doing well 5  months status post aortic valve replacement.   Plan:  In the future the patient will call and return to see [place] as needed. All of her  questions been addressed.   Salvatore Decent. Cornelius Moras, MD 12/04/2011 12:27 PM

## 2011-12-22 ENCOUNTER — Ambulatory Visit (INDEPENDENT_AMBULATORY_CARE_PROVIDER_SITE_OTHER): Payer: PRIVATE HEALTH INSURANCE | Admitting: Pharmacist

## 2011-12-22 DIAGNOSIS — Z7901 Long term (current) use of anticoagulants: Secondary | ICD-10-CM

## 2011-12-22 DIAGNOSIS — I2699 Other pulmonary embolism without acute cor pulmonale: Secondary | ICD-10-CM

## 2011-12-22 DIAGNOSIS — I4891 Unspecified atrial fibrillation: Secondary | ICD-10-CM

## 2011-12-22 DIAGNOSIS — Z953 Presence of xenogenic heart valve: Secondary | ICD-10-CM

## 2011-12-22 DIAGNOSIS — Z954 Presence of other heart-valve replacement: Secondary | ICD-10-CM

## 2011-12-22 MED ORDER — WARFARIN SODIUM 2 MG PO TABS: ORAL_TABLET | ORAL | Status: DC

## 2012-01-19 ENCOUNTER — Ambulatory Visit (INDEPENDENT_AMBULATORY_CARE_PROVIDER_SITE_OTHER): Payer: PRIVATE HEALTH INSURANCE

## 2012-01-19 DIAGNOSIS — I2699 Other pulmonary embolism without acute cor pulmonale: Secondary | ICD-10-CM

## 2012-01-19 DIAGNOSIS — Z953 Presence of xenogenic heart valve: Secondary | ICD-10-CM

## 2012-01-19 DIAGNOSIS — Z7901 Long term (current) use of anticoagulants: Secondary | ICD-10-CM

## 2012-01-19 DIAGNOSIS — I4891 Unspecified atrial fibrillation: Secondary | ICD-10-CM

## 2012-01-19 DIAGNOSIS — Z952 Presence of prosthetic heart valve: Secondary | ICD-10-CM

## 2012-01-19 LAB — POCT INR: INR: 1.6

## 2012-01-19 MED ORDER — WARFARIN SODIUM 2 MG PO TABS: ORAL_TABLET | ORAL | Status: DC

## 2012-02-02 ENCOUNTER — Ambulatory Visit (INDEPENDENT_AMBULATORY_CARE_PROVIDER_SITE_OTHER): Payer: PRIVATE HEALTH INSURANCE | Admitting: *Deleted

## 2012-02-02 DIAGNOSIS — I2699 Other pulmonary embolism without acute cor pulmonale: Secondary | ICD-10-CM

## 2012-02-02 DIAGNOSIS — Z7901 Long term (current) use of anticoagulants: Secondary | ICD-10-CM

## 2012-02-02 DIAGNOSIS — Z952 Presence of prosthetic heart valve: Secondary | ICD-10-CM

## 2012-02-02 DIAGNOSIS — Z953 Presence of xenogenic heart valve: Secondary | ICD-10-CM

## 2012-02-02 DIAGNOSIS — I4891 Unspecified atrial fibrillation: Secondary | ICD-10-CM

## 2012-02-02 LAB — POCT INR: INR: 2

## 2012-02-19 ENCOUNTER — Ambulatory Visit (INDEPENDENT_AMBULATORY_CARE_PROVIDER_SITE_OTHER): Payer: PRIVATE HEALTH INSURANCE | Admitting: Physician Assistant

## 2012-02-19 ENCOUNTER — Ambulatory Visit (INDEPENDENT_AMBULATORY_CARE_PROVIDER_SITE_OTHER): Payer: PRIVATE HEALTH INSURANCE | Admitting: *Deleted

## 2012-02-19 ENCOUNTER — Encounter: Payer: Self-pay | Admitting: Physician Assistant

## 2012-02-19 ENCOUNTER — Ambulatory Visit: Payer: PRIVATE HEALTH INSURANCE | Admitting: Physician Assistant

## 2012-02-19 VITALS — BP 184/81 | HR 78 | Ht 62.0 in | Wt 197.0 lb

## 2012-02-19 DIAGNOSIS — I4891 Unspecified atrial fibrillation: Secondary | ICD-10-CM

## 2012-02-19 DIAGNOSIS — I10 Essential (primary) hypertension: Secondary | ICD-10-CM

## 2012-02-19 DIAGNOSIS — Z953 Presence of xenogenic heart valve: Secondary | ICD-10-CM

## 2012-02-19 DIAGNOSIS — Z7901 Long term (current) use of anticoagulants: Secondary | ICD-10-CM

## 2012-02-19 DIAGNOSIS — Z952 Presence of prosthetic heart valve: Secondary | ICD-10-CM

## 2012-02-19 DIAGNOSIS — I359 Nonrheumatic aortic valve disorder, unspecified: Secondary | ICD-10-CM

## 2012-02-19 DIAGNOSIS — R42 Dizziness and giddiness: Secondary | ICD-10-CM

## 2012-02-19 DIAGNOSIS — I2699 Other pulmonary embolism without acute cor pulmonale: Secondary | ICD-10-CM

## 2012-02-19 MED ORDER — AMLODIPINE BESYLATE 5 MG PO TABS: 5.0000 mg | ORAL_TABLET | Freq: Every day | ORAL | Status: DC

## 2012-02-19 NOTE — Patient Instructions (Addendum)
Your physician recommends that you schedule a follow-up appointment as scheduled with Dr Jens Som Your physician recommends that you have lab work drawn today (BMP & CBC) Your physician has recommended you make the following change in your medication: START Amlodipine 5 mg daily

## 2012-02-19 NOTE — Progress Notes (Signed)
[address] [place], [place]  [address] Phone: [phone], Fax:  [phone]  Date:  02/19/2012   Name:  Terri Edwards   DOB:  07/08/1950   MRN:  [id number]  PCP:  Carylon Perches, MD  Primary Cardiologist:  Dr. Olga Millers  Primary Electrophysiologist:  None    History of Present Illness: Terri Edwards is a 61 y.o. female who is added on to my schedule today from the coumadin clinic b/c of "dizzy spells."  She has a hx of aortic stenosis, status post pericardial tissue AVR in 06/2011. LHC in 06/2011:  no CAD. She was readmitted to the hospital later that month with AFib with RVR as well as bilateral pulmonary emboli. She was placed on Coumadin and amiodarone.  Echo 07/14/11: Mod LVH, EF 55-60%, grade 2 diast dysfxn, AVR mean gradient 21 mmHg, mod to severe LAE, mild RVE, mod RAE, PASP 32.  She has had several other episodes of paroxysmal AFib documented since.  She last saw Dr. Olga Millers 8/13.  Most AFib was felt to be related to her surgery and Amiodarone was d/c'd.  Recommendation was to likely continue long-term with increasing TE risk factor profile (female approaching age 65 with hx of HTN).    Over the last several weeks, she notes episodic dizziness. She really describes lightheadedness. It lasts several seconds. It may occur with activity or at rest. She denies syncope or near-syncope. She denies chest discomfort. She denies palpitations. Her dyspnea with exertion has improved. She describes class II-IIb symptoms. She denies orthopnea, PND or edema. She has checked her blood pressures at work. They have ranged 180 systolic at times. She has not checked in several weeks. She does have blurry vision as well as headaches.  Labs (1/13):  LDL 86 Labs (5/13):  K 3.9, creatinine 1.0, ALT 21, Hgb 8.2 Labs (7/13):  ALT 11, TSH 3.43  Wt Readings from Last 3 Encounters:  02/19/12 197 lb (89.359 kg)  12/04/11 188 lb (85.276 kg)  11/10/11 183 lb 12.8 oz  (83.371 kg)     Past Medical History  Diagnosis Date  . HTN (hypertension), benign   . Hypercholesteremia   . Bell's palsy 2005  . Obesity (BMI 30.0-34.9) 06/29/2011  . Aortic stenosis     a. echo 1/13: normal EF, severe AS, mean gradient 50 mmHg => s/p AVR 4/13;   b.Echo 07/14/11: Mod LVH, EF 55-60%, grade 2 diast dysfxn, AVR mean gradient 21 mmHg, mod to severe LAE, mild RVE, mod RAE, PASP 32   . GERD (gastroesophageal reflux disease)   . Arthritis   . S/P aortic valve replacement with bioprosthetic valve 07/05/2011    21mm [facility] Ease pericardial tissue valve  . Atrial fibrillation     Post operative following AVR  . Pulmonary embolus     following AVR  . Hx of cardiac cath     [facility] 4/13:  no CAD, severe AS    Current Outpatient Prescriptions  Medication Sig Dispense Refill  . amoxicillin (AMOXIL) 500 MG capsule Take 4 tablets = 2 grams 30-60 minutes before dental procedures      . metoprolol succinate (TOPROL-XL) 25 MG 24 hr tablet Take 1 tablet (25 mg total) by mouth every 12 (twelve) hours.  60 tablet  11  . pantoprazole (PROTONIX) 40 MG tablet Take 40 mg by mouth daily. For reflux      . pravastatin (PRAVACHOL) 40 MG tablet Take 40 mg by mouth daily.      Marland Kitchen  warfarin (COUMADIN) 2 MG tablet Take as directed by coumadin clinic  50 tablet  3    Allergies: Allergies  Allergen Reactions  . Nickel     Rash blisters  . Sulfa Antibiotics     Blisters  Rash   . Tape     Slight skin irritation    Social History:  The patient  reports that she quit smoking about 7 months ago. Her smoking use included Cigarettes. She has a 45 pack-year smoking history. She has never used smokeless tobacco. She reports that she drinks about .5 ounces of alcohol per week.   ROS:  Please see the history of present illness. She denies spinning sensation or dizziness associated with positional changes. She denies melena, hematochezia, hematuria, vomiting, diarrhea, fevers, cough.    All other  systems reviewed and negative.   PHYSICAL EXAM: VS:  BP 184/81  Pulse 78  Ht 5\' 2"  (1.575 m)  Wt 197 lb (89.359 kg)  BMI 36.03 kg/m2  LMP 04/29/1991  Filed Vitals:   02/19/12 1556 02/19/12 1557 02/19/12 1558 02/19/12 1559  BP: 191/88 199/87 198/84 184/81  Pulse: 78 76 75 78  Height:      Weight:         Well nourished, well developed, in no acute distress HEENT: normal Neck: no JVD Cardiac:  normal S1, S2; RRR; 1/6 systolic murmur at the RUSB/LSB Lungs:  clear to auscultation bilaterally, no wheezing, rhonchi or rales Abd: soft, nontender, no hepatomegaly Ext: no edema Skin: warm and dry Neuro:  CNs 2-12 intact, no focal abnormalities noted  EKG:  NSR, HR 76, nonspecific ST-T wave changes, no change from prior tracing      ASSESSMENT AND PLAN:  1. Hypertension:   I suspect her symptoms are explained uncontrolled hypertension. She was previously on Norvasc 5 mg daily. I have recommended restarting Norvasc 5 mg daily. Check a basic metabolic panel today. Given her dizziness,  I will also check a CBC.  2. Atrial Fibrillation:   She is maintaining sinus rhythm. I do not believe that she is having paroxysms of atrial fibrillation to explain her symptoms. Continue Coumadin.  3. Pulmonary Embolism:   She has completed 6 months of Coumadin therapy for her pulmonary embolism.  4. Aortic Stenosis, Status Post Tissue AVR:   Echo in 4/13 with well-functioning AVR.  5. Disposition:   Recommend follow up with me in 2 weeks to recheck her blood pressure. She already has an appointment with Dr. Jens Som December 18 and would like to keep that appointment.  Luna Glasgow, PA-C  3:44 PM 02/19/2012

## 2012-02-20 LAB — CBC WITH DIFFERENTIAL/PLATELET
Basophils Absolute: 0 10*3/uL (ref 0.0–0.1)
Eosinophils Absolute: 0.2 10*3/uL (ref 0.0–0.7)
Eosinophils Relative: 3.2 % (ref 0.0–5.0)
HCT: 33.9 % — ABNORMAL LOW (ref 36.0–46.0)
Lymphs Abs: 2.5 10*3/uL (ref 0.7–4.0)
MCHC: 31.7 g/dL (ref 30.0–36.0)
MCV: 80.6 fl (ref 78.0–100.0)
Monocytes Absolute: 0.4 10*3/uL (ref 0.1–1.0)
Neutrophils Relative %: 50.8 % (ref 43.0–77.0)
Platelets: 294 10*3/uL (ref 150.0–400.0)
RDW: 16.6 % — ABNORMAL HIGH (ref 11.5–14.6)
WBC: 6.4 10*3/uL (ref 4.5–10.5)

## 2012-02-20 LAB — BASIC METABOLIC PANEL
BUN: 16 mg/dL (ref 6–23)
Chloride: 101 mEq/L (ref 96–112)
Creatinine, Ser: 0.9 mg/dL (ref 0.4–1.2)
Glucose, Bld: 140 mg/dL — ABNORMAL HIGH (ref 70–99)

## 2012-02-21 ENCOUNTER — Telehealth: Payer: Self-pay | Admitting: *Deleted

## 2012-02-21 NOTE — Telephone Encounter (Signed)
pt notified about lab results and states about her elevated glucose that she had just eaten. pt advised to still f/u w/PCP as planned. I will fax results to PCP today, pt said ok

## 2012-02-21 NOTE — Telephone Encounter (Signed)
Message copied by Tarri Fuller on Wed Feb 21, 2012  5:25 PM ------      Message from: [place], [place] T      Created: Wed Feb 21, 2012  1:41 PM       Potassium and kidney function look good.      Hgb improved.      Sugar somewhat high but random - fax to PCP and follow up as planned with PCP.      Continue with current treatment plan.      Tereso Newcomer, PA-C  4:49 PM 02/01/2012

## 2012-03-06 ENCOUNTER — Encounter: Payer: Self-pay | Admitting: Cardiology

## 2012-03-06 ENCOUNTER — Ambulatory Visit (INDEPENDENT_AMBULATORY_CARE_PROVIDER_SITE_OTHER): Payer: PRIVATE HEALTH INSURANCE | Admitting: *Deleted

## 2012-03-06 ENCOUNTER — Ambulatory Visit (INDEPENDENT_AMBULATORY_CARE_PROVIDER_SITE_OTHER): Payer: PRIVATE HEALTH INSURANCE | Admitting: Cardiology

## 2012-03-06 VITALS — BP 140/70 | HR 72 | Ht 62.0 in | Wt 196.0 lb

## 2012-03-06 DIAGNOSIS — I4891 Unspecified atrial fibrillation: Secondary | ICD-10-CM

## 2012-03-06 DIAGNOSIS — I10 Essential (primary) hypertension: Secondary | ICD-10-CM

## 2012-03-06 DIAGNOSIS — I2699 Other pulmonary embolism without acute cor pulmonale: Secondary | ICD-10-CM

## 2012-03-06 DIAGNOSIS — Z952 Presence of prosthetic heart valve: Secondary | ICD-10-CM

## 2012-03-06 DIAGNOSIS — R42 Dizziness and giddiness: Secondary | ICD-10-CM | POA: Insufficient documentation

## 2012-03-06 DIAGNOSIS — Z953 Presence of xenogenic heart valve: Secondary | ICD-10-CM

## 2012-03-06 DIAGNOSIS — E7800 Pure hypercholesterolemia, unspecified: Secondary | ICD-10-CM

## 2012-03-06 DIAGNOSIS — Z7901 Long term (current) use of anticoagulants: Secondary | ICD-10-CM

## 2012-03-06 DIAGNOSIS — D649 Anemia, unspecified: Secondary | ICD-10-CM

## 2012-03-06 LAB — POCT INR: INR: 2.4

## 2012-03-06 NOTE — Assessment & Plan Note (Signed)
Patient has completed a full 6 months of Coumadin for her previous postoperative pulmonary embolus.

## 2012-03-06 NOTE — Progress Notes (Signed)
HPI: Pleasant female for fu of AVR and PAF. She has a history of aortic stenosis, status post Pericardial tissue aortic valve replacement in April 2013. LHC in 06/2011 demonstrated no coronary artery disease. She was readmitted to the hospital later that month with atrial fibrillation with rapid ventricular rate as well as bilateral pulmonary emboli. She was placed on Coumadin. Echo 07/14/11: Mod LVH, EF 55-60%, grade 2 diast dysfxn, AVR mean gradient 21 mmHg, mod to severe LAE, mild RVE, mod RAE, PASP 32. Patient seen by Tereso Newcomer in July 2013 with episode of AFib with a heart rate of 134 during exercise at cardiac rehabilitation. Seen earlier this month with dizziness. Blood pressure was increased and Norvasc was resumed. Hemoglobin 10.8 with MCV 86. Since she was last seen, she has improved. She has mild dyspnea on exertion but no orthopnea, PND, pedal edema, palpitations, syncope or chest pain. She has had no further dizzy spells. Note her previous episodes occurred spontaneously and were not related to position. They would last approximately 15 seconds and resolved. There were no associated neurological symptoms, no palpitations, no chest pain.   Current Outpatient Prescriptions  Medication Sig Dispense Refill  . amLODipine (NORVASC) 5 MG tablet Take 1 tablet (5 mg total) by mouth daily.  90 tablet  2  . amoxicillin (AMOXIL) 500 MG capsule Take 4 tablets = 2 grams 30-60 minutes before dental procedures      . metoprolol succinate (TOPROL-XL) 25 MG 24 hr tablet Take 1 tablet (25 mg total) by mouth every 12 (twelve) hours.  60 tablet  11  . pantoprazole (PROTONIX) 40 MG tablet Take 40 mg by mouth daily. For reflux      . pravastatin (PRAVACHOL) 40 MG tablet Take 40 mg by mouth daily.      Marland Kitchen warfarin (COUMADIN) 2 MG tablet Take as directed by coumadin clinic  50 tablet  3     Past Medical History  Diagnosis Date  . HTN (hypertension), benign   . Hypercholesteremia   . Bell's palsy 2005  .  Obesity (BMI 30.0-34.9) 06/29/2011  . Aortic stenosis     a. echo 1/13: normal EF, severe AS, mean gradient 50 mmHg => s/p AVR 4/13;   b.Echo 07/14/11: Mod LVH, EF 55-60%, grade 2 diast dysfxn, AVR mean gradient 21 mmHg, mod to severe LAE, mild RVE, mod RAE, PASP 32   . GERD (gastroesophageal reflux disease)   . Arthritis   . S/P aortic valve replacement with bioprosthetic valve 07/05/2011    21mm [facility] Ease pericardial tissue valve  . Atrial fibrillation     Post operative following AVR  . Pulmonary embolus     following AVR  . Hx of cardiac cath     [facility] [place] [facility] 4/13:  no CAD, severe AS    Past Surgical History  Procedure Date  . Knee arthroscopy lft  . Cataract extraction     bil  . 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 Aortic valve replacement 07/05/2011    Procedure: AORTIC VALVE REPLACEMENT (AVR);  Surgeon: Purcell Nails, MD;  Location: [facility] OR;  Service: Open Heart Surgery;  Laterality: N/A;    History   Social History  . Marital Status: Married    Spouse Name: N/A    Number of Children: N/A  . 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 Smoking status: Former Smoker -- 1.0 packs/day  for 45 years    Types: Cigarettes    Quit date: 07/04/2011  . Smokeless tobacco: Never Used  . Alcohol Use: 0.5 oz/week    1 drink(s) per week     Comment: rare  . Drug Use: Not on file  . Sexually Active: Yes    Birth Control/ Protection: Post-menopausal   Other Topics Concern  . Not on file   Social History Narrative  . No narrative on file    ROS: no fevers or chills, productive cough, hemoptysis, dysphasia, odynophagia, melena, hematochezia, dysuria, hematuria, rash, seizure activity, orthopnea, PND, pedal edema, claudication. Remaining systems are negative.  Physical Exam: Well-developed well-nourished in no acute distress.  Skin is warm and dry.  HEENT is normal.  Neck is supple.  Chest is clear to  auscultation with normal expansion.  Cardiovascular exam is regular rate and rhythm. 3/6 systolic murmur radiating to the carotids. Abdominal exam nontender or distended. No masses palpated. Extremities show no edema. neuro grossly intact  ECG 02/19/12-sinus with nonspecific ST changes

## 2012-03-06 NOTE — Assessment & Plan Note (Signed)
Continue statin. 

## 2012-03-06 NOTE — Assessment & Plan Note (Signed)
I have asked her to followup with her primary care physician for evaluation of normocytic anemia.

## 2012-03-06 NOTE — Assessment & Plan Note (Signed)
Etiology unclear. Previously felt related to hypertension and symptoms have improved with the addition of Norvasc. I find no evidence of arrhythmias and her neurological exam is grossly intact. If she has further symptoms we will consider event monitor and potential TEE. Note her carotid Dopplers prior to her aortic valve replacement showed no stenosis. There were no associated palpitations.

## 2012-03-06 NOTE — Patient Instructions (Addendum)
Your physician recommends that you schedule a follow-up appointment in: 8 WEEKS WITH DR CRENSHAW  

## 2012-03-06 NOTE — Assessment & Plan Note (Signed)
Continue SBE prophylaxis. Plan repeat echocardiogram in 6 months.

## 2012-03-06 NOTE — Assessment & Plan Note (Signed)
Patient remains in sinus rhythm on examination. She has embolic risk factors of hypertension and female sex. I will continue Coumadin for now.

## 2012-03-06 NOTE — Assessment & Plan Note (Signed)
Blood pressure is controlled. Continue present medications. 

## 2012-03-18 ENCOUNTER — Other Ambulatory Visit: Payer: Self-pay | Admitting: Cardiology

## 2012-04-05 ENCOUNTER — Ambulatory Visit (INDEPENDENT_AMBULATORY_CARE_PROVIDER_SITE_OTHER): Payer: PRIVATE HEALTH INSURANCE | Admitting: *Deleted

## 2012-04-05 DIAGNOSIS — Z952 Presence of prosthetic heart valve: Secondary | ICD-10-CM

## 2012-04-05 DIAGNOSIS — Z7901 Long term (current) use of anticoagulants: Secondary | ICD-10-CM

## 2012-04-05 DIAGNOSIS — I2699 Other pulmonary embolism without acute cor pulmonale: Secondary | ICD-10-CM

## 2012-04-05 DIAGNOSIS — Z953 Presence of xenogenic heart valve: Secondary | ICD-10-CM

## 2012-04-05 DIAGNOSIS — I4891 Unspecified atrial fibrillation: Secondary | ICD-10-CM

## 2012-04-29 ENCOUNTER — Ambulatory Visit: Payer: PRIVATE HEALTH INSURANCE | Admitting: Cardiology

## 2012-04-29 ENCOUNTER — Ambulatory Visit (INDEPENDENT_AMBULATORY_CARE_PROVIDER_SITE_OTHER): Payer: PRIVATE HEALTH INSURANCE | Admitting: *Deleted

## 2012-04-29 DIAGNOSIS — Z952 Presence of prosthetic heart valve: Secondary | ICD-10-CM

## 2012-04-29 DIAGNOSIS — I4891 Unspecified atrial fibrillation: Secondary | ICD-10-CM

## 2012-04-29 DIAGNOSIS — Z7901 Long term (current) use of anticoagulants: Secondary | ICD-10-CM

## 2012-04-29 DIAGNOSIS — Z953 Presence of xenogenic heart valve: Secondary | ICD-10-CM

## 2012-04-29 DIAGNOSIS — I2699 Other pulmonary embolism without acute cor pulmonale: Secondary | ICD-10-CM

## 2012-06-05 ENCOUNTER — Ambulatory Visit (INDEPENDENT_AMBULATORY_CARE_PROVIDER_SITE_OTHER): Payer: PRIVATE HEALTH INSURANCE | Admitting: Cardiology

## 2012-06-05 ENCOUNTER — Encounter: Payer: Self-pay | Admitting: Cardiology

## 2012-06-05 ENCOUNTER — Ambulatory Visit (INDEPENDENT_AMBULATORY_CARE_PROVIDER_SITE_OTHER): Payer: PRIVATE HEALTH INSURANCE | Admitting: *Deleted

## 2012-06-05 VITALS — BP 126/74 | HR 87 | Ht 62.0 in | Wt 198.0 lb

## 2012-06-05 DIAGNOSIS — Z953 Presence of xenogenic heart valve: Secondary | ICD-10-CM

## 2012-06-05 DIAGNOSIS — I4891 Unspecified atrial fibrillation: Secondary | ICD-10-CM

## 2012-06-05 DIAGNOSIS — E7800 Pure hypercholesterolemia, unspecified: Secondary | ICD-10-CM

## 2012-06-05 DIAGNOSIS — R42 Dizziness and giddiness: Secondary | ICD-10-CM

## 2012-06-05 DIAGNOSIS — I10 Essential (primary) hypertension: Secondary | ICD-10-CM

## 2012-06-05 MED ORDER — WARFARIN SODIUM 2 MG PO TABS: ORAL_TABLET | ORAL | Status: DC

## 2012-06-05 NOTE — Assessment & Plan Note (Signed)
Continue statin. 

## 2012-06-05 NOTE — Assessment & Plan Note (Signed)
Patient's dizziness much improved. We discussed a monitor day but she would prefer to wait and see if her symptoms worsen. If they do we will consider a monitor and transesophageal echocardiogram.

## 2012-06-05 NOTE — Progress Notes (Signed)
HPI: Pleasant female for fu of AVR and PAF. She has a history of aortic stenosis, status post Pericardial tissue aortic valve replacement in April 2013. LHC in 06/2011 demonstrated no coronary artery disease. She was readmitted to the hospital later that month with atrial fibrillation with rapid ventricular rate as well as bilateral pulmonary emboli. She was placed on Coumadin. Echo 07/14/11: Mod LVH, EF 55-60%, grade 2 diast dysfxn, AVR mean gradient 21 mmHg, mod to severe LAE, mild RVE, mod RAE, PASP 32. Patient seen by Tereso Newcomer in July 2013 with episode of AFib with a heart rate of 134 during exercise at cardiac rehabilitation. I last saw her in December of 2013. Since that time, she has dyspnea with more extreme activities but not routine activities. No orthopnea, PND, pedal edema, chest pain or syncope. Her dizzy spells are much improved.   Current Outpatient Prescriptions  Medication Sig Dispense Refill  . amLODipine (NORVASC) 5 MG tablet Take 1 tablet (5 mg total) by mouth daily.  90 tablet  2  . amoxicillin (AMOXIL) 500 MG capsule Take 4 tablets = 2 grams 30-60 minutes before dental procedures      . metoprolol succinate (TOPROL-XL) 25 MG 24 hr tablet Take 1 tablet (25 mg total) by mouth every 12 (twelve) hours.  60 tablet  11  . pantoprazole (PROTONIX) 40 MG tablet Take 40 mg by mouth daily. For reflux      . pravastatin (PRAVACHOL) 40 MG tablet Take 40 mg by mouth daily.      . pravastatin (PRAVACHOL) 40 MG tablet TAKE 1 TABLET EVERY DAY  30 tablet  10  . warfarin (COUMADIN) 2 MG tablet Take as directed by coumadin clinic  50 tablet  3   No current facility-administered medications for this visit.     Past Medical History  Diagnosis Date  . HTN (hypertension), benign   . Hypercholesteremia   . Bell's palsy 2005  . Obesity (BMI 30.0-34.9) 06/29/2011  . Aortic stenosis     a. echo 1/13: normal EF, severe AS, mean gradient 50 mmHg => s/p AVR 4/13;   b.Echo 07/14/11: Mod LVH, EF  55-60%, grade 2 diast dysfxn, AVR mean gradient 21 mmHg, mod to severe LAE, mild RVE, mod RAE, PASP 32   . GERD (gastroesophageal reflux disease)   . Arthritis   . S/P aortic valve replacement with bioprosthetic valve 07/05/2011    21mm [facility] Ease pericardial tissue valve  . Atrial fibrillation     Post operative following AVR  . Pulmonary embolus     following AVR  . Hx of cardiac cath     [facility] 4/13:  no CAD, severe AS    Past Surgical History  Procedure Laterality Date  . Knee arthroscopy  lft  . Cataract extraction      bil  . Pars plana vitrectomy w/ repair of macular hole    . Aortic valve replacement  07/05/2011    Procedure: AORTIC VALVE REPLACEMENT (AVR);  Surgeon: Purcell Nails, MD;  Location: [facility] OR;  Service: Open Heart Surgery;  Laterality: N/A;    History   Social History  . Marital Status: Married    Spouse Name: N/A    Number of Children: N/A  . 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 Smoking status: Former Smoker -- 1.00 packs/day for 45 years    Types: Cigarettes    Quit date: 07/04/2011  .  Smokeless tobacco: Never Used  . Alcohol Use: 0.5 oz/week    1 drink(s) per week     Comment: rare  . Drug Use: Not on file  . Sexually Active: Yes    Birth Control/ Protection: Post-menopausal   Other Topics Concern  . Not on file   Social History Narrative  . No narrative on file    ROS: no fevers or chills, productive cough, hemoptysis, dysphasia, odynophagia, melena, hematochezia, dysuria, hematuria, rash, seizure activity, orthopnea, PND, pedal edema, claudication. Remaining systems are negative.  Physical Exam: Well-developed well-nourished in no acute distress.  Skin is warm and dry.  HEENT is normal.  Neck is supple.  Chest is clear to auscultation with normal expansion.  Cardiovascular exam is regular rate and rhythm. 2/6 systolic murmur left sternal border. No diastolic  murmur. Abdominal exam nontender or distended. No masses palpated. Extremities show no edema. neuro grossly intact

## 2012-06-05 NOTE — Assessment & Plan Note (Signed)
Blood pressure controlled. Continue present medications. 

## 2012-06-05 NOTE — Patient Instructions (Addendum)
Your physician wants you to follow-up in: 4 MONTHS WITH DR Jens Som You will receive a reminder letter in the mail two months in advance. If you don't receive a letter, please call our office to schedule the follow-up appointment.   Your physician has requested that you have an echocardiogram. Echocardiography is a painless test that uses sound waves to create images of your heart. It provides your doctor with information about the size and shape of your heart and how well your heart's chambers and valves are working. This procedure takes approximately one hour. There are no restrictions for this procedure.

## 2012-06-05 NOTE — Assessment & Plan Note (Signed)
Continue SBE prophylaxis. Repeat echocardiogram. 

## 2012-06-05 NOTE — Assessment & Plan Note (Signed)
Patient remains in sinus rhythm on examination. Continue Toprol. Embolic risk factor of hypertension and female sex. I plan to continue Coumadin for now.

## 2012-07-08 ENCOUNTER — Ambulatory Visit (INDEPENDENT_AMBULATORY_CARE_PROVIDER_SITE_OTHER): Payer: PRIVATE HEALTH INSURANCE | Admitting: *Deleted

## 2012-07-08 ENCOUNTER — Ambulatory Visit (HOSPITAL_COMMUNITY): Payer: PRIVATE HEALTH INSURANCE | Attending: Internal Medicine | Admitting: Radiology

## 2012-07-08 DIAGNOSIS — Z953 Presence of xenogenic heart valve: Secondary | ICD-10-CM

## 2012-07-08 DIAGNOSIS — Z952 Presence of prosthetic heart valve: Secondary | ICD-10-CM

## 2012-07-08 DIAGNOSIS — Z7901 Long term (current) use of anticoagulants: Secondary | ICD-10-CM

## 2012-07-08 DIAGNOSIS — I2699 Other pulmonary embolism without acute cor pulmonale: Secondary | ICD-10-CM

## 2012-07-08 DIAGNOSIS — I4891 Unspecified atrial fibrillation: Secondary | ICD-10-CM

## 2012-07-08 DIAGNOSIS — I359 Nonrheumatic aortic valve disorder, unspecified: Secondary | ICD-10-CM

## 2012-07-08 LAB — POCT INR: INR: 1.8

## 2012-07-08 NOTE — Progress Notes (Signed)
Echocardiogram performed.  

## 2012-07-10 ENCOUNTER — Other Ambulatory Visit: Payer: Self-pay | Admitting: *Deleted

## 2012-07-10 DIAGNOSIS — R931 Abnormal findings on diagnostic imaging of heart and coronary circulation: Secondary | ICD-10-CM

## 2012-07-11 ENCOUNTER — Telehealth: Payer: Self-pay | Admitting: Cardiology

## 2012-07-11 ENCOUNTER — Encounter: Payer: Self-pay | Admitting: *Deleted

## 2012-07-11 NOTE — Telephone Encounter (Signed)
Spoke with pt, aware of echo results. Forwarded to [facility] to schedule CTA

## 2012-07-11 NOTE — Telephone Encounter (Signed)
New problem    Pt says she's returning your call

## 2012-07-19 ENCOUNTER — Ambulatory Visit (HOSPITAL_COMMUNITY)
Admission: RE | Admit: 2012-07-19 | Discharge: 2012-07-19 | Disposition: A | Discharge status: Home / Self Care | Payer: PRIVATE HEALTH INSURANCE | Source: Ambulatory Visit | Attending: Cardiology | Admitting: Cardiology

## 2012-07-19 DIAGNOSIS — Z538 Procedure and treatment not carried out for other reasons: Secondary | ICD-10-CM | POA: Insufficient documentation

## 2012-07-19 DIAGNOSIS — R931 Abnormal findings on diagnostic imaging of heart and coronary circulation: Secondary | ICD-10-CM

## 2012-07-19 DIAGNOSIS — R9389 Abnormal findings on diagnostic imaging of other specified body structures: Secondary | ICD-10-CM | POA: Insufficient documentation

## 2012-07-19 MED ORDER — METOPROLOL TARTRATE 1 MG/ML IV SOLN: 5.0000 mg | INTRAVENOUS | Status: DC | PRN

## 2012-07-19 MED ORDER — METOPROLOL TARTRATE 1 MG/ML IV SOLN
INTRAVENOUS | Status: AC
  Filled 2012-07-19: qty 10

## 2012-07-29 ENCOUNTER — Ambulatory Visit (INDEPENDENT_AMBULATORY_CARE_PROVIDER_SITE_OTHER): Payer: 59 | Admitting: *Deleted

## 2012-07-29 DIAGNOSIS — I4891 Unspecified atrial fibrillation: Secondary | ICD-10-CM

## 2012-07-29 DIAGNOSIS — I2699 Other pulmonary embolism without acute cor pulmonale: Secondary | ICD-10-CM

## 2012-07-29 DIAGNOSIS — Z953 Presence of xenogenic heart valve: Secondary | ICD-10-CM

## 2012-07-29 DIAGNOSIS — Z952 Presence of prosthetic heart valve: Secondary | ICD-10-CM

## 2012-07-29 DIAGNOSIS — Z7901 Long term (current) use of anticoagulants: Secondary | ICD-10-CM

## 2012-08-07 ENCOUNTER — Telehealth: Payer: Self-pay | Admitting: *Deleted

## 2012-08-07 NOTE — Telephone Encounter (Signed)
F/u ° ° °Pt returning your call °

## 2012-08-07 NOTE — Telephone Encounter (Signed)
Left message for pt to call, they were unable to do the CT scan because they were unable to get IV access. Per dr Jens Som the pt will need a TEE to assess valve.

## 2012-08-07 NOTE — Telephone Encounter (Signed)
Spoke with pt, she does not wish to do any other testing at this time. She wants to wait and if she were to start having problems she will let us know. She will call and scheduled her follow up appt in august. Will make dr Jens Som aware.

## 2012-08-26 ENCOUNTER — Ambulatory Visit (INDEPENDENT_AMBULATORY_CARE_PROVIDER_SITE_OTHER): Payer: PRIVATE HEALTH INSURANCE | Admitting: *Deleted

## 2012-08-26 DIAGNOSIS — Z953 Presence of xenogenic heart valve: Secondary | ICD-10-CM

## 2012-08-26 DIAGNOSIS — Z7901 Long term (current) use of anticoagulants: Secondary | ICD-10-CM

## 2012-08-26 DIAGNOSIS — I2699 Other pulmonary embolism without acute cor pulmonale: Secondary | ICD-10-CM

## 2012-08-26 DIAGNOSIS — I4891 Unspecified atrial fibrillation: Secondary | ICD-10-CM

## 2012-08-26 DIAGNOSIS — Z952 Presence of prosthetic heart valve: Secondary | ICD-10-CM

## 2012-09-06 ENCOUNTER — Other Ambulatory Visit: Payer: Self-pay | Admitting: *Deleted

## 2012-09-06 DIAGNOSIS — I4891 Unspecified atrial fibrillation: Secondary | ICD-10-CM

## 2012-09-06 MED ORDER — METOPROLOL SUCCINATE ER 25 MG PO TB24: 25.0000 mg | ORAL_TABLET | Freq: Two times a day (BID) | ORAL | Status: DC

## 2012-09-09 ENCOUNTER — Other Ambulatory Visit: Payer: Self-pay

## 2012-09-09 MED ORDER — PANTOPRAZOLE SODIUM 40 MG PO TBEC: 40.0000 mg | DELAYED_RELEASE_TABLET | Freq: Every day | ORAL | Status: DC

## 2012-09-09 MED ORDER — AMLODIPINE BESYLATE 5 MG PO TABS: 5.0000 mg | ORAL_TABLET | Freq: Every day | ORAL | Status: DC

## 2012-09-09 MED ORDER — PRAVASTATIN SODIUM 40 MG PO TABS: ORAL_TABLET | ORAL | Status: DC

## 2012-09-11 ENCOUNTER — Other Ambulatory Visit: Payer: Self-pay | Admitting: *Deleted

## 2012-09-11 DIAGNOSIS — I4891 Unspecified atrial fibrillation: Secondary | ICD-10-CM

## 2012-09-11 MED ORDER — METOPROLOL SUCCINATE ER 25 MG PO TB24: 25.0000 mg | ORAL_TABLET | Freq: Two times a day (BID) | ORAL | Status: DC

## 2012-09-19 IMAGING — CR DG CHEST 2V
2 series · 2 of 2 positions shown · non-contrast
Comparison: Chest x-ray of 07/20/2011

CLINICAL DATA: Cough, heart surgery in [place], follow-up, former
smoking history

CHEST - 2 VIEW

[view not recorded (1 of 2)]
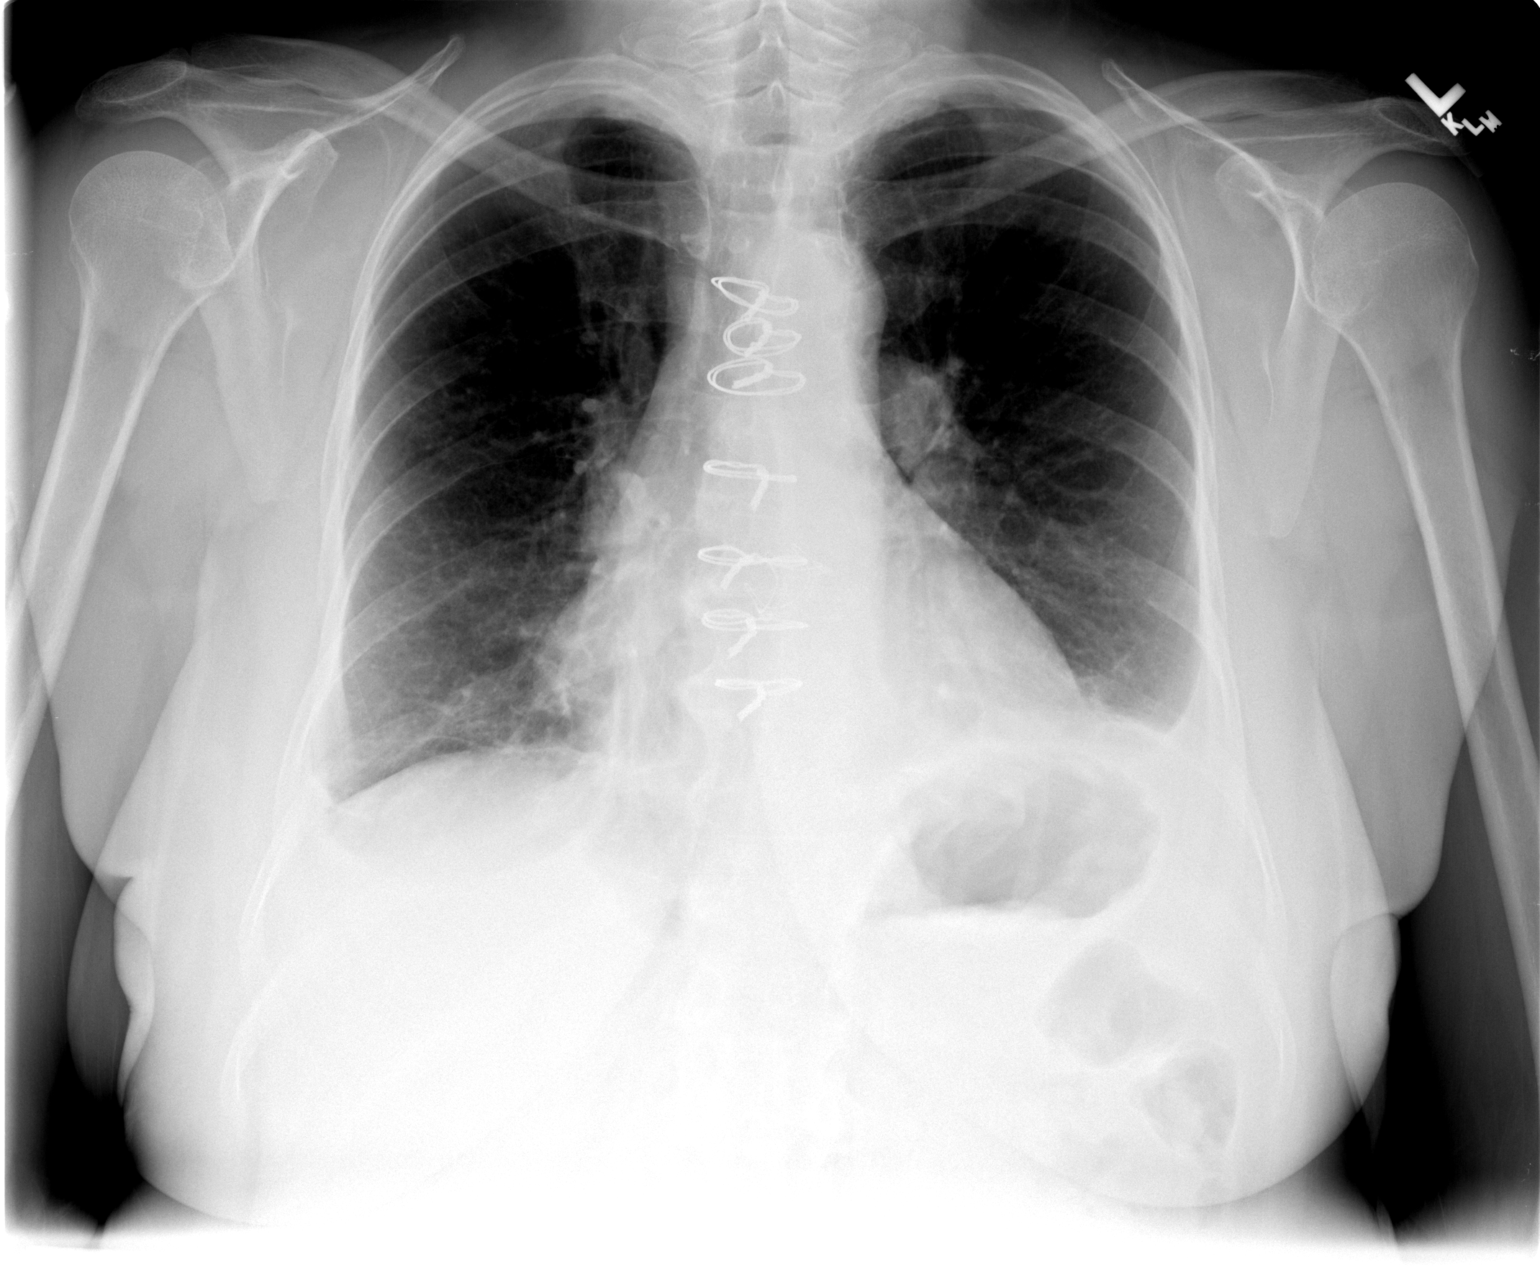

[view not recorded (2 of 2)]
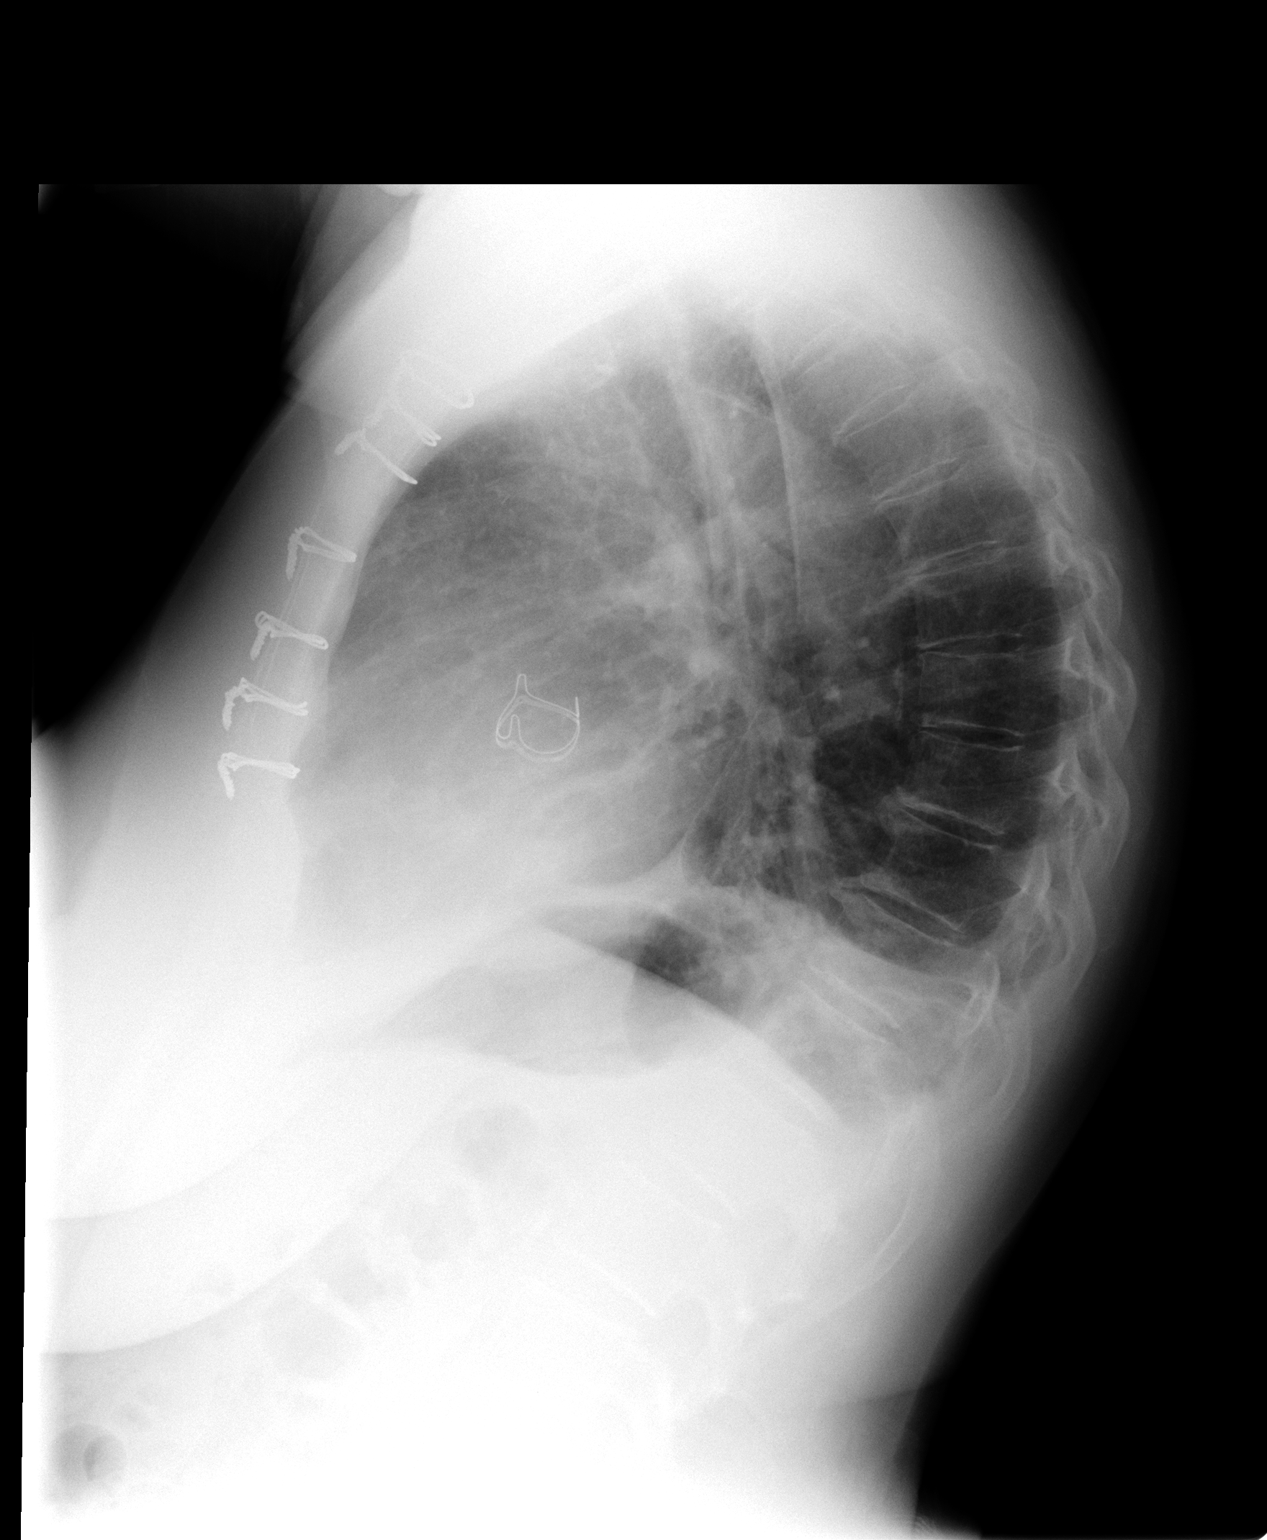

[2 of 2 positions shown; findings below may reference images not displayed]

FINDINGS: Small bilateral pleural effusions remain although
aeration of the lung bases has improved slightly.  There has been
some decrease in basilar atelectasis.  Mild cardiomegaly is stable.
Median sternotomy sutures are noted and an aortic valve replacement
is present.
IMPRESSION: Slightly better aeration with some decrease in basilar atelectasis
and small effusions.

## 2012-10-07 ENCOUNTER — Ambulatory Visit (INDEPENDENT_AMBULATORY_CARE_PROVIDER_SITE_OTHER): Payer: 59 | Admitting: *Deleted

## 2012-10-07 DIAGNOSIS — I4891 Unspecified atrial fibrillation: Secondary | ICD-10-CM

## 2012-10-07 DIAGNOSIS — Z952 Presence of prosthetic heart valve: Secondary | ICD-10-CM

## 2012-10-07 DIAGNOSIS — Z953 Presence of xenogenic heart valve: Secondary | ICD-10-CM

## 2012-10-07 DIAGNOSIS — Z7901 Long term (current) use of anticoagulants: Secondary | ICD-10-CM

## 2012-10-07 DIAGNOSIS — I2699 Other pulmonary embolism without acute cor pulmonale: Secondary | ICD-10-CM

## 2012-10-07 MED ORDER — WARFARIN SODIUM 2 MG PO TABS: ORAL_TABLET | ORAL | Status: DC

## 2012-11-03 ENCOUNTER — Other Ambulatory Visit: Payer: Self-pay | Admitting: Physician Assistant

## 2012-11-20 ENCOUNTER — Ambulatory Visit (INDEPENDENT_AMBULATORY_CARE_PROVIDER_SITE_OTHER): Payer: 59 | Admitting: *Deleted

## 2012-11-20 DIAGNOSIS — I4891 Unspecified atrial fibrillation: Secondary | ICD-10-CM

## 2012-11-20 DIAGNOSIS — Z953 Presence of xenogenic heart valve: Secondary | ICD-10-CM

## 2012-11-20 DIAGNOSIS — I2699 Other pulmonary embolism without acute cor pulmonale: Secondary | ICD-10-CM

## 2012-11-20 DIAGNOSIS — Z952 Presence of prosthetic heart valve: Secondary | ICD-10-CM

## 2012-11-20 DIAGNOSIS — Z7901 Long term (current) use of anticoagulants: Secondary | ICD-10-CM

## 2012-11-20 LAB — POCT INR: INR: 2.1

## 2013-01-08 ENCOUNTER — Ambulatory Visit (INDEPENDENT_AMBULATORY_CARE_PROVIDER_SITE_OTHER): Payer: PRIVATE HEALTH INSURANCE | Admitting: *Deleted

## 2013-01-08 DIAGNOSIS — Z952 Presence of prosthetic heart valve: Secondary | ICD-10-CM

## 2013-01-08 DIAGNOSIS — I4891 Unspecified atrial fibrillation: Secondary | ICD-10-CM

## 2013-01-08 DIAGNOSIS — I2699 Other pulmonary embolism without acute cor pulmonale: Secondary | ICD-10-CM

## 2013-01-08 DIAGNOSIS — Z953 Presence of xenogenic heart valve: Secondary | ICD-10-CM

## 2013-01-08 DIAGNOSIS — Z7901 Long term (current) use of anticoagulants: Secondary | ICD-10-CM

## 2013-01-08 LAB — POCT INR: INR: 2.5

## 2013-01-08 MED ORDER — WARFARIN SODIUM 2 MG PO TABS: ORAL_TABLET | ORAL | Status: DC

## 2013-02-19 ENCOUNTER — Ambulatory Visit (INDEPENDENT_AMBULATORY_CARE_PROVIDER_SITE_OTHER): Payer: PRIVATE HEALTH INSURANCE | Admitting: *Deleted

## 2013-02-19 DIAGNOSIS — I2699 Other pulmonary embolism without acute cor pulmonale: Secondary | ICD-10-CM

## 2013-02-19 DIAGNOSIS — I4891 Unspecified atrial fibrillation: Secondary | ICD-10-CM

## 2013-02-19 DIAGNOSIS — Z953 Presence of xenogenic heart valve: Secondary | ICD-10-CM

## 2013-02-19 DIAGNOSIS — Z952 Presence of prosthetic heart valve: Secondary | ICD-10-CM

## 2013-02-19 DIAGNOSIS — Z7901 Long term (current) use of anticoagulants: Secondary | ICD-10-CM

## 2013-04-02 ENCOUNTER — Ambulatory Visit (INDEPENDENT_AMBULATORY_CARE_PROVIDER_SITE_OTHER): Payer: PRIVATE HEALTH INSURANCE | Admitting: *Deleted

## 2013-04-02 DIAGNOSIS — Z952 Presence of prosthetic heart valve: Secondary | ICD-10-CM

## 2013-04-02 DIAGNOSIS — Z7901 Long term (current) use of anticoagulants: Secondary | ICD-10-CM

## 2013-04-02 DIAGNOSIS — Z953 Presence of xenogenic heart valve: Secondary | ICD-10-CM

## 2013-04-02 DIAGNOSIS — I2699 Other pulmonary embolism without acute cor pulmonale: Secondary | ICD-10-CM

## 2013-04-02 DIAGNOSIS — I4891 Unspecified atrial fibrillation: Secondary | ICD-10-CM

## 2013-04-02 LAB — POCT INR: INR: 2.4

## 2013-05-13 ENCOUNTER — Telehealth: Payer: Self-pay | Admitting: *Deleted

## 2013-05-13 NOTE — Telephone Encounter (Signed)
Pt was Dx with bronchitis last week.  Went to Urgent Care.  Given Rx for Cefdinor 300mg  bid x 5 days.  She saw Dr Willey Blade yesterday.  Was started on Levaquin 500mg  qd x 7 days and Prednisone taper 10mg  (4 tabs x 3 days, 3 tabs x 3 days, 2 tabs x 3 days, 1 tabs x 3 days).  INR appr for 2/25 was rescheduled due to weather.  Told pt to decrease coumadin dose to 2mg  daily till INR on 3/2.  Will call pt to come in sooner if we are open.

## 2013-05-13 NOTE — Telephone Encounter (Signed)
Please call patient regarding patient being on antibiotic and Coumadin.  Appointment for 2/25 was rescheduled to 3/2 due to weather. / tgs

## 2013-05-19 ENCOUNTER — Ambulatory Visit (INDEPENDENT_AMBULATORY_CARE_PROVIDER_SITE_OTHER): Payer: PRIVATE HEALTH INSURANCE | Admitting: *Deleted

## 2013-05-19 DIAGNOSIS — Z952 Presence of prosthetic heart valve: Secondary | ICD-10-CM

## 2013-05-19 DIAGNOSIS — Z7901 Long term (current) use of anticoagulants: Secondary | ICD-10-CM

## 2013-05-19 DIAGNOSIS — Z953 Presence of xenogenic heart valve: Secondary | ICD-10-CM

## 2013-05-19 DIAGNOSIS — I2699 Other pulmonary embolism without acute cor pulmonale: Secondary | ICD-10-CM

## 2013-05-19 DIAGNOSIS — I4891 Unspecified atrial fibrillation: Secondary | ICD-10-CM

## 2013-05-19 LAB — POCT INR: INR: 1.8

## 2013-06-03 ENCOUNTER — Other Ambulatory Visit: Payer: Self-pay | Admitting: *Deleted

## 2013-06-03 MED ORDER — AMLODIPINE BESYLATE 5 MG PO TABS: 5.0000 mg | ORAL_TABLET | Freq: Every day | ORAL | Status: DC

## 2013-06-03 MED ORDER — PANTOPRAZOLE SODIUM 40 MG PO TBEC: 40.0000 mg | DELAYED_RELEASE_TABLET | Freq: Every day | ORAL | Status: DC

## 2013-06-25 ENCOUNTER — Ambulatory Visit (INDEPENDENT_AMBULATORY_CARE_PROVIDER_SITE_OTHER): Payer: PRIVATE HEALTH INSURANCE | Admitting: *Deleted

## 2013-06-25 DIAGNOSIS — Z952 Presence of prosthetic heart valve: Secondary | ICD-10-CM

## 2013-06-25 DIAGNOSIS — I4891 Unspecified atrial fibrillation: Secondary | ICD-10-CM

## 2013-06-25 DIAGNOSIS — I2699 Other pulmonary embolism without acute cor pulmonale: Secondary | ICD-10-CM

## 2013-06-25 DIAGNOSIS — Z7901 Long term (current) use of anticoagulants: Secondary | ICD-10-CM

## 2013-06-25 DIAGNOSIS — Z953 Presence of xenogenic heart valve: Secondary | ICD-10-CM

## 2013-06-25 LAB — POCT INR: INR: 2.1

## 2013-06-25 MED ORDER — WARFARIN SODIUM 2 MG PO TABS: ORAL_TABLET | ORAL | Status: DC

## 2013-07-21 ENCOUNTER — Other Ambulatory Visit: Payer: Self-pay | Admitting: *Deleted

## 2013-07-21 DIAGNOSIS — I4891 Unspecified atrial fibrillation: Secondary | ICD-10-CM

## 2013-07-21 MED ORDER — AMLODIPINE BESYLATE 5 MG PO TABS: 5.0000 mg | ORAL_TABLET | Freq: Every day | ORAL | Status: DC

## 2013-07-21 MED ORDER — PANTOPRAZOLE SODIUM 40 MG PO TBEC: 40.0000 mg | DELAYED_RELEASE_TABLET | Freq: Every day | ORAL | Status: DC

## 2013-07-21 MED ORDER — METOPROLOL SUCCINATE ER 25 MG PO TB24: 25.0000 mg | ORAL_TABLET | Freq: Two times a day (BID) | ORAL | Status: DC

## 2013-08-06 ENCOUNTER — Ambulatory Visit (INDEPENDENT_AMBULATORY_CARE_PROVIDER_SITE_OTHER): Payer: PRIVATE HEALTH INSURANCE | Admitting: *Deleted

## 2013-08-06 DIAGNOSIS — Z953 Presence of xenogenic heart valve: Secondary | ICD-10-CM

## 2013-08-06 DIAGNOSIS — I2699 Other pulmonary embolism without acute cor pulmonale: Secondary | ICD-10-CM

## 2013-08-06 DIAGNOSIS — I4891 Unspecified atrial fibrillation: Secondary | ICD-10-CM

## 2013-08-06 DIAGNOSIS — Z7901 Long term (current) use of anticoagulants: Secondary | ICD-10-CM

## 2013-08-06 DIAGNOSIS — Z952 Presence of prosthetic heart valve: Secondary | ICD-10-CM

## 2013-08-06 LAB — POCT INR: INR: 2.2

## 2013-08-13 ENCOUNTER — Telehealth: Payer: Self-pay | Admitting: *Deleted

## 2013-08-13 NOTE — Telephone Encounter (Signed)
Please call patient back regarding being started on antibiotic / tgs

## 2013-08-13 NOTE — Telephone Encounter (Signed)
Starting on Levaquin 500mg  daily x 10 days and prednisone taper (40 x 3, 30 x 3, 20 x 3, 10 x 3, 5 x 3) for bronchitis.  Told pt to decrease coumadin to 1 1/2 tablets daily until INR check on 08/25/13.  She verbalized understanding.

## 2013-08-25 ENCOUNTER — Ambulatory Visit (INDEPENDENT_AMBULATORY_CARE_PROVIDER_SITE_OTHER): Payer: PRIVATE HEALTH INSURANCE | Admitting: *Deleted

## 2013-08-25 DIAGNOSIS — Z952 Presence of prosthetic heart valve: Secondary | ICD-10-CM

## 2013-08-25 DIAGNOSIS — Z953 Presence of xenogenic heart valve: Secondary | ICD-10-CM

## 2013-08-25 DIAGNOSIS — I4891 Unspecified atrial fibrillation: Secondary | ICD-10-CM

## 2013-08-25 DIAGNOSIS — Z7901 Long term (current) use of anticoagulants: Secondary | ICD-10-CM

## 2013-08-25 DIAGNOSIS — I2699 Other pulmonary embolism without acute cor pulmonale: Secondary | ICD-10-CM

## 2013-08-25 LAB — POCT INR: INR: 2

## 2013-09-22 ENCOUNTER — Encounter: Payer: Self-pay | Admitting: Cardiology

## 2013-09-22 ENCOUNTER — Ambulatory Visit (INDEPENDENT_AMBULATORY_CARE_PROVIDER_SITE_OTHER): Payer: PRIVATE HEALTH INSURANCE | Admitting: Cardiology

## 2013-09-22 ENCOUNTER — Encounter: Payer: Self-pay | Admitting: *Deleted

## 2013-09-22 VITALS — BP 130/70 | HR 73 | Ht 63.0 in | Wt 189.9 lb

## 2013-09-22 DIAGNOSIS — I4891 Unspecified atrial fibrillation: Secondary | ICD-10-CM

## 2013-09-22 DIAGNOSIS — E7800 Pure hypercholesterolemia, unspecified: Secondary | ICD-10-CM

## 2013-09-22 DIAGNOSIS — F17200 Nicotine dependence, unspecified, uncomplicated: Secondary | ICD-10-CM

## 2013-09-22 DIAGNOSIS — Z952 Presence of prosthetic heart valve: Secondary | ICD-10-CM

## 2013-09-22 DIAGNOSIS — Z953 Presence of xenogenic heart valve: Secondary | ICD-10-CM

## 2013-09-22 DIAGNOSIS — I480 Paroxysmal atrial fibrillation: Secondary | ICD-10-CM

## 2013-09-22 MED ORDER — AMLODIPINE BESYLATE 5 MG PO TABS: 5.0000 mg | ORAL_TABLET | Freq: Every day | ORAL | Status: DC

## 2013-09-22 MED ORDER — PANTOPRAZOLE SODIUM 40 MG PO TBEC: 40.0000 mg | DELAYED_RELEASE_TABLET | Freq: Two times a day (BID) | ORAL | Status: DC

## 2013-09-22 MED ORDER — METOPROLOL SUCCINATE ER 25 MG PO TB24: 25.0000 mg | ORAL_TABLET | Freq: Two times a day (BID) | ORAL | Status: DC

## 2013-09-22 MED ORDER — AMOXICILLIN 500 MG PO CAPS: ORAL_CAPSULE | ORAL | Status: DC

## 2013-09-22 NOTE — Assessment & Plan Note (Signed)
Blood pressure controlled. Continue present medications. Check potassium and renal function. 

## 2013-09-22 NOTE — Assessment & Plan Note (Signed)
Patient counseled on discontinuing. 

## 2013-09-22 NOTE — Assessment & Plan Note (Signed)
Patient declined statin.

## 2013-09-22 NOTE — Patient Instructions (Signed)
Your physician wants you to follow-up in: ONE YEAR WITH DR CRENSHAW You will receive a reminder letter in the mail two months in advance. If you don't receive a letter, please call our office to schedule the follow-up appointment.   Your physician has requested that you have an echocardiogram. Echocardiography is a painless test that uses sound waves to create images of your heart. It provides your doctor with information about the size and shape of your heart and how well your heart's chambers and valves are working. This procedure takes approximately one hour. There are no restrictions for this procedure.   Your physician recommends that you HAVE LAB WORK TODAY 

## 2013-09-22 NOTE — Assessment & Plan Note (Addendum)
Previous gradient higher than normal. Mild dyspnea on exertion. Repeat echocardiogram. Continue SBE prophylaxis. There was a question of sinus of Valsalva aneurysm on previous echocardiogram. This will help reassess. She may require CT.

## 2013-09-22 NOTE — Assessment & Plan Note (Signed)
Patient remains in sinus rhythm. Continue beta blocker. Continue Coumadin.

## 2013-09-22 NOTE — Progress Notes (Signed)
HPI: FU AVR and PAF. She has a history of aortic stenosis, status post Pericardial tissue aortic valve replacement in April 2013. LHC in 06/2011 demonstrated no coronary artery disease. She was readmitted to the hospital later that month with atrial fibrillation with rapid ventricular rate as well as bilateral pulmonary emboli. She was placed on Coumadin. Patient seen by Richardson Dopp in July 2013 with episode of AFib with a heart rate of 134 during exercise at cardiac rehabilitation. Last echo 4/14 showed normal LV function, bioprosthetic aortic valve with mean gradient of 27 mmHg, biatrial enlargement, sinus of valsalva aneurysm cannot be excluded. CTA ordered but not pt declined. I last saw her in April 2014. Since that time, she has dyspnea with more extreme activities but not routine activities. No orthopnea, PND, pedal edema, chest pain or syncope.    Current Outpatient Prescriptions  Medication Sig Dispense Refill  . amLODipine (NORVASC) 5 MG tablet Take 1 tablet (5 mg total) by mouth daily.  30 tablet  0  . amoxicillin (AMOXIL) 500 MG capsule Take 4 tablets = 2 grams 30-60 minutes before dental procedures      . metoprolol succinate (TOPROL-XL) 25 MG 24 hr tablet Take 1 tablet (25 mg total) by mouth every 12 (twelve) hours.  60 tablet  0  . pantoprazole (PROTONIX) 40 MG tablet Take 1 tablet (40 mg total) by mouth daily. For reflux  30 tablet  0  . pravastatin (PRAVACHOL) 40 MG tablet TAKE 1 TABLET EVERY DAY  90 tablet  1  . warfarin (COUMADIN) 2 MG tablet Take 1 1/2 tablets daily except 2 tablets on Mondays and Thursdays  50 tablet  6   No current facility-administered medications for this visit.     Past Medical History  Diagnosis Date  . HTN (hypertension), benign   . Hypercholesteremia   . Bell's palsy 2005  . Obesity (BMI 30.0-34.9) 06/29/2011  . Aortic stenosis     a. echo 1/13: normal EF, severe AS, mean gradient 50 mmHg => s/p AVR 4/13;   b.Echo 07/14/11: Mod LVH, EF  55-60%, grade 2 diast dysfxn, AVR mean gradient 21 mmHg, mod to severe LAE, mild RVE, mod RAE, PASP 32   . GERD (gastroesophageal reflux disease)   . Arthritis   . S/P aortic valve replacement with bioprosthetic valve 07/05/2011    33mm [facility] Ease pericardial tissue valve  . Atrial fibrillation     Post operative following AVR  . Pulmonary embolus     following AVR  . Hx of cardiac cath     [facility] 4/13:  no CAD, severe AS    Past Surgical History  Procedure Laterality Date  . Knee arthroscopy  lft  . Cataract extraction      bil  . Pars plana vitrectomy w/ repair of macular hole    . Aortic valve replacement  07/05/2011    Procedure: AORTIC VALVE REPLACEMENT (AVR);  Surgeon: Rexene Alberts, MD;  Location: [facility];  Service: Open Heart Surgery;  Laterality: N/A;    History   Social History  . Marital Status: Married    Spouse Name: N/A    Number of Children: N/A  . 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 Smoking status: Former Smoker -- 1.00 packs/day for 45 years    Types: Cigarettes    Quit date: 07/04/2011  . Smokeless tobacco: Never Used  .  Alcohol Use: 0.5 oz/week    1 drink(s) per week     Comment: rare  . Drug Use: Not on file  . Sexual Activity: Yes    Birth Control/ Protection: Post-menopausal   Other Topics Concern  . Not on file   Social History Narrative  . No narrative on file    ROS: no fevers or chills, productive cough, hemoptysis, dysphasia, odynophagia, melena, hematochezia, dysuria, hematuria, rash, seizure activity, orthopnea, PND, pedal edema, claudication. Remaining systems are negative.  Physical Exam: Well-developed well-nourished in no acute distress.  Skin is warm and dry.  HEENT is normal.  Neck is supple.  Chest is clear to auscultation with normal expansion.  Cardiovascular exam is regular rate and rhythm. 3/6 systolic murmur; no diastolic murmur Abdominal exam nontender or  distended. No masses palpated. Extremities show no edema. neuro grossly intact  ECG Sinus rhythm at a rate of 73. Nonspecific ST changes.

## 2013-09-23 LAB — BASIC METABOLIC PANEL WITH GFR
BUN: 10 mg/dL (ref 6–23)
CALCIUM: 9 mg/dL (ref 8.4–10.5)
CHLORIDE: 107 meq/L (ref 96–112)
CO2: 27 meq/L (ref 19–32)
CREATININE: 0.79 mg/dL (ref 0.50–1.10)
GFR, Est African American: >89 mL/min
GFR, Est Non African American: 79 mL/min
GLUCOSE: 103 mg/dL — AB (ref 70–99)
Potassium: 3.9 mEq/L (ref 3.5–5.3)
SODIUM: 139 meq/L (ref 135–145)

## 2013-09-23 LAB — CBC
HCT: 37.4 % (ref 36.0–46.0)
HEMOGLOBIN: 12.7 g/dL (ref 12.0–15.0)
MCH: 28.3 pg (ref 26.0–34.0)
MCHC: 34 g/dL (ref 30.0–36.0)
MCV: 83.5 fL (ref 78.0–100.0)
PLATELETS: 285 10*3/uL (ref 150–400)
RBC: 4.48 MIL/uL (ref 3.87–5.11)
RDW: 15.9 % — ABNORMAL HIGH (ref 11.5–15.5)
WBC: 9 10*3/uL (ref 4.0–10.5)

## 2013-09-30 ENCOUNTER — Ambulatory Visit (HOSPITAL_COMMUNITY): Payer: PRIVATE HEALTH INSURANCE

## 2013-10-06 ENCOUNTER — Ambulatory Visit (INDEPENDENT_AMBULATORY_CARE_PROVIDER_SITE_OTHER): Payer: PRIVATE HEALTH INSURANCE | Admitting: *Deleted

## 2013-10-06 DIAGNOSIS — Z952 Presence of prosthetic heart valve: Secondary | ICD-10-CM

## 2013-10-06 DIAGNOSIS — I2699 Other pulmonary embolism without acute cor pulmonale: Secondary | ICD-10-CM

## 2013-10-06 DIAGNOSIS — I4891 Unspecified atrial fibrillation: Secondary | ICD-10-CM

## 2013-10-06 DIAGNOSIS — Z953 Presence of xenogenic heart valve: Secondary | ICD-10-CM

## 2013-10-06 DIAGNOSIS — Z7901 Long term (current) use of anticoagulants: Secondary | ICD-10-CM

## 2013-10-06 LAB — POCT INR: INR: 2.2

## 2013-12-08 ENCOUNTER — Ambulatory Visit (INDEPENDENT_AMBULATORY_CARE_PROVIDER_SITE_OTHER): Payer: PRIVATE HEALTH INSURANCE | Admitting: *Deleted

## 2013-12-08 DIAGNOSIS — Z7901 Long term (current) use of anticoagulants: Secondary | ICD-10-CM

## 2013-12-08 DIAGNOSIS — I4891 Unspecified atrial fibrillation: Secondary | ICD-10-CM

## 2013-12-08 DIAGNOSIS — I2699 Other pulmonary embolism without acute cor pulmonale: Secondary | ICD-10-CM

## 2013-12-08 DIAGNOSIS — Z952 Presence of prosthetic heart valve: Secondary | ICD-10-CM

## 2013-12-08 DIAGNOSIS — Z953 Presence of xenogenic heart valve: Secondary | ICD-10-CM

## 2013-12-08 LAB — POCT INR: INR: 1.8

## 2013-12-24 ENCOUNTER — Telehealth: Payer: Self-pay | Admitting: *Deleted

## 2013-12-24 NOTE — Telephone Encounter (Signed)
Patient states that she was put on antibiotic today and wants to know if she needs to adjust Coumadin / tgs

## 2013-12-25 NOTE — Telephone Encounter (Signed)
Patient is taking Levaquin, Prednisone and Hydrocodone cough syrup. / tgs

## 2013-12-25 NOTE — Telephone Encounter (Signed)
Spoke with pt.  She started all 3 medications yesterday.  Levaquin and Prednisone may increase INR.  She took 2mg  yesterday rather than 3mg .  She historically runs on the low side.  Will have her continue her current dose and then recheck INR on Monday.

## 2013-12-29 ENCOUNTER — Ambulatory Visit (INDEPENDENT_AMBULATORY_CARE_PROVIDER_SITE_OTHER): Payer: PRIVATE HEALTH INSURANCE | Admitting: *Deleted

## 2013-12-29 DIAGNOSIS — Z7901 Long term (current) use of anticoagulants: Secondary | ICD-10-CM

## 2013-12-29 DIAGNOSIS — Z953 Presence of xenogenic heart valve: Secondary | ICD-10-CM

## 2013-12-29 DIAGNOSIS — Z954 Presence of other heart-valve replacement: Secondary | ICD-10-CM

## 2013-12-29 DIAGNOSIS — I4891 Unspecified atrial fibrillation: Secondary | ICD-10-CM

## 2013-12-29 DIAGNOSIS — I2699 Other pulmonary embolism without acute cor pulmonale: Secondary | ICD-10-CM

## 2013-12-29 LAB — POCT INR: INR: 1.8

## 2013-12-29 MED ORDER — WARFARIN SODIUM 2 MG PO TABS: ORAL_TABLET | ORAL | Status: DC

## 2014-01-19 ENCOUNTER — Ambulatory Visit (INDEPENDENT_AMBULATORY_CARE_PROVIDER_SITE_OTHER): Payer: PRIVATE HEALTH INSURANCE | Admitting: *Deleted

## 2014-01-19 DIAGNOSIS — Z7901 Long term (current) use of anticoagulants: Secondary | ICD-10-CM

## 2014-01-19 DIAGNOSIS — Z954 Presence of other heart-valve replacement: Secondary | ICD-10-CM

## 2014-01-19 DIAGNOSIS — Z953 Presence of xenogenic heart valve: Secondary | ICD-10-CM

## 2014-01-19 DIAGNOSIS — I4891 Unspecified atrial fibrillation: Secondary | ICD-10-CM

## 2014-01-19 DIAGNOSIS — I2699 Other pulmonary embolism without acute cor pulmonale: Secondary | ICD-10-CM

## 2014-01-19 LAB — POCT INR: INR: 2.3

## 2014-02-18 ENCOUNTER — Ambulatory Visit (INDEPENDENT_AMBULATORY_CARE_PROVIDER_SITE_OTHER): Payer: PRIVATE HEALTH INSURANCE | Admitting: *Deleted

## 2014-02-18 DIAGNOSIS — Z953 Presence of xenogenic heart valve: Secondary | ICD-10-CM

## 2014-02-18 DIAGNOSIS — I4891 Unspecified atrial fibrillation: Secondary | ICD-10-CM

## 2014-02-18 DIAGNOSIS — Z7901 Long term (current) use of anticoagulants: Secondary | ICD-10-CM

## 2014-02-18 DIAGNOSIS — Z954 Presence of other heart-valve replacement: Secondary | ICD-10-CM

## 2014-02-18 DIAGNOSIS — I2699 Other pulmonary embolism without acute cor pulmonale: Secondary | ICD-10-CM

## 2014-02-18 LAB — POCT INR: INR: 1.9

## 2014-03-23 ENCOUNTER — Ambulatory Visit (INDEPENDENT_AMBULATORY_CARE_PROVIDER_SITE_OTHER): Payer: 59 | Admitting: *Deleted

## 2014-03-23 DIAGNOSIS — Z7901 Long term (current) use of anticoagulants: Secondary | ICD-10-CM

## 2014-03-23 DIAGNOSIS — I4891 Unspecified atrial fibrillation: Secondary | ICD-10-CM

## 2014-03-23 DIAGNOSIS — I2699 Other pulmonary embolism without acute cor pulmonale: Secondary | ICD-10-CM

## 2014-03-23 DIAGNOSIS — Z953 Presence of xenogenic heart valve: Secondary | ICD-10-CM

## 2014-03-23 DIAGNOSIS — Z954 Presence of other heart-valve replacement: Secondary | ICD-10-CM

## 2014-03-23 LAB — POCT INR: INR: 2.4

## 2014-04-27 ENCOUNTER — Ambulatory Visit (INDEPENDENT_AMBULATORY_CARE_PROVIDER_SITE_OTHER): Payer: 59 | Admitting: *Deleted

## 2014-04-27 DIAGNOSIS — Z7901 Long term (current) use of anticoagulants: Secondary | ICD-10-CM

## 2014-04-27 DIAGNOSIS — I4891 Unspecified atrial fibrillation: Secondary | ICD-10-CM

## 2014-04-27 DIAGNOSIS — Z953 Presence of xenogenic heart valve: Secondary | ICD-10-CM

## 2014-04-27 DIAGNOSIS — Z954 Presence of other heart-valve replacement: Secondary | ICD-10-CM

## 2014-04-27 DIAGNOSIS — I2699 Other pulmonary embolism without acute cor pulmonale: Secondary | ICD-10-CM

## 2014-04-27 LAB — POCT INR: INR: 2.1

## 2014-04-27 MED ORDER — WARFARIN SODIUM 2 MG PO TABS: ORAL_TABLET | ORAL | Status: DC

## 2014-06-03 ENCOUNTER — Ambulatory Visit (INDEPENDENT_AMBULATORY_CARE_PROVIDER_SITE_OTHER): Payer: 59 | Admitting: *Deleted

## 2014-06-03 DIAGNOSIS — Z954 Presence of other heart-valve replacement: Secondary | ICD-10-CM

## 2014-06-03 DIAGNOSIS — Z7901 Long term (current) use of anticoagulants: Secondary | ICD-10-CM

## 2014-06-03 DIAGNOSIS — Z953 Presence of xenogenic heart valve: Secondary | ICD-10-CM

## 2014-06-03 DIAGNOSIS — I2699 Other pulmonary embolism without acute cor pulmonale: Secondary | ICD-10-CM

## 2014-06-03 DIAGNOSIS — I4891 Unspecified atrial fibrillation: Secondary | ICD-10-CM

## 2014-06-03 LAB — POCT INR: INR: 2.4

## 2014-07-15 ENCOUNTER — Ambulatory Visit (INDEPENDENT_AMBULATORY_CARE_PROVIDER_SITE_OTHER): Payer: 59 | Admitting: *Deleted

## 2014-07-15 DIAGNOSIS — Z7901 Long term (current) use of anticoagulants: Secondary | ICD-10-CM

## 2014-07-15 DIAGNOSIS — I2699 Other pulmonary embolism without acute cor pulmonale: Secondary | ICD-10-CM | POA: Diagnosis not present

## 2014-07-15 DIAGNOSIS — Z954 Presence of other heart-valve replacement: Secondary | ICD-10-CM

## 2014-07-15 DIAGNOSIS — I4891 Unspecified atrial fibrillation: Secondary | ICD-10-CM

## 2014-07-15 DIAGNOSIS — Z953 Presence of xenogenic heart valve: Secondary | ICD-10-CM

## 2014-07-15 LAB — POCT INR: INR: 2.4

## 2014-08-26 ENCOUNTER — Ambulatory Visit (INDEPENDENT_AMBULATORY_CARE_PROVIDER_SITE_OTHER): Payer: Medicare Other | Admitting: *Deleted

## 2014-08-26 DIAGNOSIS — I2699 Other pulmonary embolism without acute cor pulmonale: Secondary | ICD-10-CM | POA: Diagnosis not present

## 2014-08-26 DIAGNOSIS — I4891 Unspecified atrial fibrillation: Secondary | ICD-10-CM | POA: Diagnosis not present

## 2014-08-26 DIAGNOSIS — Z7901 Long term (current) use of anticoagulants: Secondary | ICD-10-CM | POA: Diagnosis not present

## 2014-08-26 DIAGNOSIS — Z954 Presence of other heart-valve replacement: Secondary | ICD-10-CM | POA: Diagnosis not present

## 2014-08-26 DIAGNOSIS — Z953 Presence of xenogenic heart valve: Secondary | ICD-10-CM

## 2014-08-26 LAB — POCT INR: INR: 2.5

## 2014-09-18 ENCOUNTER — Other Ambulatory Visit: Payer: Self-pay

## 2014-09-18 DIAGNOSIS — Z953 Presence of xenogenic heart valve: Secondary | ICD-10-CM

## 2014-09-18 DIAGNOSIS — E7800 Pure hypercholesterolemia, unspecified: Secondary | ICD-10-CM

## 2014-09-18 DIAGNOSIS — I480 Paroxysmal atrial fibrillation: Secondary | ICD-10-CM

## 2014-09-18 DIAGNOSIS — F17200 Nicotine dependence, unspecified, uncomplicated: Secondary | ICD-10-CM

## 2014-09-18 MED ORDER — AMLODIPINE BESYLATE 5 MG PO TABS: 5.0000 mg | ORAL_TABLET | Freq: Every day | ORAL | Status: DC

## 2014-09-18 MED ORDER — PANTOPRAZOLE SODIUM 40 MG PO TBEC: 40.0000 mg | DELAYED_RELEASE_TABLET | Freq: Two times a day (BID) | ORAL | Status: DC

## 2014-10-07 ENCOUNTER — Ambulatory Visit (INDEPENDENT_AMBULATORY_CARE_PROVIDER_SITE_OTHER): Payer: Medicare Other | Admitting: *Deleted

## 2014-10-07 DIAGNOSIS — I2699 Other pulmonary embolism without acute cor pulmonale: Secondary | ICD-10-CM

## 2014-10-07 DIAGNOSIS — Z7901 Long term (current) use of anticoagulants: Secondary | ICD-10-CM

## 2014-10-07 DIAGNOSIS — Z953 Presence of xenogenic heart valve: Secondary | ICD-10-CM

## 2014-10-07 DIAGNOSIS — I4891 Unspecified atrial fibrillation: Secondary | ICD-10-CM

## 2014-10-07 DIAGNOSIS — Z954 Presence of other heart-valve replacement: Secondary | ICD-10-CM

## 2014-10-07 LAB — POCT INR: INR: 2.8

## 2014-10-12 ENCOUNTER — Other Ambulatory Visit: Payer: Self-pay

## 2014-10-12 DIAGNOSIS — Z953 Presence of xenogenic heart valve: Secondary | ICD-10-CM

## 2014-10-12 DIAGNOSIS — F17200 Nicotine dependence, unspecified, uncomplicated: Secondary | ICD-10-CM

## 2014-10-12 DIAGNOSIS — E7800 Pure hypercholesterolemia, unspecified: Secondary | ICD-10-CM

## 2014-10-12 DIAGNOSIS — I480 Paroxysmal atrial fibrillation: Secondary | ICD-10-CM

## 2014-10-12 MED ORDER — METOPROLOL SUCCINATE ER 25 MG PO TB24: 25.0000 mg | ORAL_TABLET | Freq: Two times a day (BID) | ORAL | Status: DC

## 2014-11-16 ENCOUNTER — Ambulatory Visit (INDEPENDENT_AMBULATORY_CARE_PROVIDER_SITE_OTHER): Payer: Medicare Other | Admitting: Pharmacist

## 2014-11-16 DIAGNOSIS — Z953 Presence of xenogenic heart valve: Secondary | ICD-10-CM

## 2014-11-16 DIAGNOSIS — I4891 Unspecified atrial fibrillation: Secondary | ICD-10-CM | POA: Diagnosis not present

## 2014-11-16 DIAGNOSIS — Z954 Presence of other heart-valve replacement: Secondary | ICD-10-CM | POA: Diagnosis not present

## 2014-11-16 DIAGNOSIS — Z7901 Long term (current) use of anticoagulants: Secondary | ICD-10-CM

## 2014-11-16 DIAGNOSIS — I2699 Other pulmonary embolism without acute cor pulmonale: Secondary | ICD-10-CM | POA: Diagnosis not present

## 2014-11-16 LAB — POCT INR: INR: 2.1

## 2014-11-16 MED ORDER — WARFARIN SODIUM 2 MG PO TABS: ORAL_TABLET | ORAL | Status: DC

## 2014-11-24 ENCOUNTER — Ambulatory Visit (INDEPENDENT_AMBULATORY_CARE_PROVIDER_SITE_OTHER): Payer: Medicare Other | Admitting: Cardiology

## 2014-11-24 ENCOUNTER — Encounter: Payer: Self-pay | Admitting: Cardiology

## 2014-11-24 ENCOUNTER — Other Ambulatory Visit: Payer: Self-pay

## 2014-11-24 VITALS — BP 130/76 | HR 75 | Ht 62.0 in | Wt 175.0 lb

## 2014-11-24 DIAGNOSIS — E7800 Pure hypercholesterolemia, unspecified: Secondary | ICD-10-CM

## 2014-11-24 DIAGNOSIS — Z954 Presence of other heart-valve replacement: Secondary | ICD-10-CM | POA: Diagnosis not present

## 2014-11-24 DIAGNOSIS — Z953 Presence of xenogenic heart valve: Secondary | ICD-10-CM

## 2014-11-24 DIAGNOSIS — Z720 Tobacco use: Secondary | ICD-10-CM

## 2014-11-24 DIAGNOSIS — R0989 Other specified symptoms and signs involving the circulatory and respiratory systems: Secondary | ICD-10-CM | POA: Diagnosis not present

## 2014-11-24 DIAGNOSIS — I480 Paroxysmal atrial fibrillation: Secondary | ICD-10-CM

## 2014-11-24 DIAGNOSIS — F17200 Nicotine dependence, unspecified, uncomplicated: Secondary | ICD-10-CM

## 2014-11-24 DIAGNOSIS — I10 Essential (primary) hypertension: Secondary | ICD-10-CM

## 2014-11-24 MED ORDER — PANTOPRAZOLE SODIUM 40 MG PO TBEC: 40.0000 mg | DELAYED_RELEASE_TABLET | Freq: Two times a day (BID) | ORAL | Status: DC

## 2014-11-24 MED ORDER — AMOXICILLIN 500 MG PO CAPS: ORAL_CAPSULE | ORAL | Status: DC

## 2014-11-24 MED ORDER — AMLODIPINE BESYLATE 5 MG PO TABS
5.0000 mg | ORAL_TABLET | Freq: Every day | ORAL | Status: DC
Start: 2014-11-24 — End: 2015-05-20

## 2014-11-24 NOTE — Assessment & Plan Note (Signed)
Patient remains in sinus rhythm. Continue beta blocker and Coumadin. Check hemoglobin.

## 2014-11-24 NOTE — Assessment & Plan Note (Signed)
Previous gradient higher than normal. Mild dyspnea on exertion. Repeat echocardiogram. Continue SBE prophylaxis. There was a question of sinus of Valsalva aneurysm on previous echocardiogram. This will help reassess. She may require CT.

## 2014-11-24 NOTE — Assessment & Plan Note (Signed)
Patient previously declined statins.

## 2014-11-24 NOTE — Progress Notes (Signed)
HPI: FU AVR and PAF. She has a history of aortic stenosis, status post Pericardial tissue aortic valve replacement in April 2013. LHC in 06/2011 demonstrated no coronary artery disease. She was readmitted to the hospital later that month with atrial fibrillation with rapid ventricular rate as well as bilateral pulmonary emboli. She was placed on Coumadin. Patient seen by Richardson Dopp in July 2013 with episode of AFib with a heart rate of 134 during exercise at cardiac rehabilitation. Last echo 4/14 showed normal LV function, bioprosthetic aortic valve with mean gradient of 27 mmHg, biatrial enlargement, sinus of valsalva aneurysm cannot be excluded. CTA ordered but not pt declined. Since I last saw her, the patient has dyspnea with more extreme activities but not with routine activities. It is relieved with rest. It is not associated with chest pain. There is no orthopnea, PND or pedal edema. There is no syncope or palpitations. There is no exertional chest pain.   Current Outpatient Prescriptions  Medication Sig Dispense Refill  . amLODipine (NORVASC) 5 MG tablet Take 1 tablet (5 mg total) by mouth daily. 30 tablet 1  . amoxicillin (AMOXIL) 500 MG capsule Take 4 tablets = 2 grams 30-60 minutes before dental procedures 4 capsule 6  . metoprolol succinate (TOPROL-XL) 25 MG 24 hr tablet Take 1 tablet (25 mg total) by mouth every 12 (twelve) hours. 60 tablet 12  . pantoprazole (PROTONIX) 40 MG tablet Take 1 tablet (40 mg total) by mouth 2 (two) times daily. For reflux 60 tablet 1  . warfarin (COUMADIN) 2 MG tablet Take 2 tablets daily except 1 1/2 on Sundays, Tuesdays and Thursdays 60 tablet 3   No current facility-administered medications for this visit.     Past Medical History  Diagnosis Date  . HTN (hypertension), benign   . Hypercholesteremia   . Bell's palsy 2005  . Obesity (BMI 30.0-34.9) 06/29/2011  . Aortic stenosis     a. echo 1/13: normal EF, severe AS, mean gradient 50 mmHg =>  s/p AVR 4/13;   b.Echo 07/14/11: Mod LVH, EF 55-60%, grade 2 diast dysfxn, AVR mean gradient 21 mmHg, mod to severe LAE, mild RVE, mod RAE, PASP 32   . GERD (gastroesophageal reflux disease)   . Arthritis   . S/P aortic valve replacement with bioprosthetic valve 07/05/2011    67mm [facility] Ease pericardial tissue valve  . Atrial fibrillation     Post operative following AVR  . Pulmonary embolus     following AVR  . Hx of cardiac cath     [facility] 4/13:  no CAD, severe AS    Past Surgical History  Procedure Laterality Date  . Knee arthroscopy  lft  . Cataract extraction      bil  . Pars plana vitrectomy w/ repair of macular hole    . Aortic valve replacement  07/05/2011    Procedure: AORTIC VALVE REPLACEMENT (AVR);  Surgeon: Rexene Alberts, MD;  Location: [facility];  Service: Open Heart Surgery;  Laterality: N/A;    Social History   Social History  . Marital Status: Married    Spouse Name: N/A  . Number of Children: N/A  . 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 Smoking status: Former Smoker -- 1.00 packs/day for 45 years    Types: Cigarettes    Quit date: 07/04/2011  . Smokeless tobacco: Never Used  . Alcohol Use: 0.5 oz/week  1 drink(s) per week     Comment: rare  . Drug Use: Not on file  . Sexual Activity: Yes    Birth Control/ Protection: Post-menopausal   Other Topics Concern  . Not on file   Social History Narrative    ROS: no fevers or chills, productive cough, hemoptysis, dysphasia, odynophagia, melena, hematochezia, dysuria, hematuria, rash, seizure activity, orthopnea, PND, pedal edema, claudication. Remaining systems are negative.  Physical Exam: Well-developed well-nourished in no acute distress.  Skin is warm and dry.  HEENT is normal.  Neck is supple. Bilateral bruits Chest is clear to auscultation with normal expansion.  Cardiovascular exam is regular rate and rhythm. 2/6 systolic murmur left  sternal border. No diastolic murmur. Abdominal exam nontender or distended. No masses palpated. Extremities show no edema. neuro grossly intact  ECG sinus rhythm at a rate of 75. Nonspecific ST changes.

## 2014-11-24 NOTE — Patient Instructions (Signed)
Schedule Echo   Schedule Carotid Dopplers    Lab work today  ( bmet,cbc )    Your physician wants you to follow-up in: 1 year You will receive a reminder letter in the mail two months in advance. If you don't receive a letter, please call our office to schedule the follow-up appointment.

## 2014-11-24 NOTE — Assessment & Plan Note (Signed)
Patient with bilateral carotid bruits. Check carotid Dopplers.

## 2014-11-24 NOTE — Assessment & Plan Note (Signed)
Patient counseled on discontinuing. 

## 2014-11-24 NOTE — Assessment & Plan Note (Signed)
Blood pressure controlled. Continue present medications. Check potassium and renal function. 

## 2014-11-25 LAB — CBC WITH DIFFERENTIAL/PLATELET
BASOS ABS: 0.1 10*3/uL (ref 0.0–0.1)
BASOS PCT: 1 % (ref 0–1)
Eosinophils Absolute: 0.2 10*3/uL (ref 0.0–0.7)
Eosinophils Relative: 2 % (ref 0–5)
HCT: 43 % (ref 36.0–46.0)
HEMOGLOBIN: 14 g/dL (ref 12.0–15.0)
Lymphocytes Relative: 41 % (ref 12–46)
Lymphs Abs: 3.9 10*3/uL (ref 0.7–4.0)
MCH: 29.4 pg (ref 26.0–34.0)
MCHC: 32.6 g/dL (ref 30.0–36.0)
MCV: 90.3 fL (ref 78.0–100.0)
MPV: 10.3 fL (ref 8.6–12.4)
Monocytes Absolute: 0.7 10*3/uL (ref 0.1–1.0)
Monocytes Relative: 7 % (ref 3–12)
NEUTROS ABS: 4.6 10*3/uL (ref 1.7–7.7)
NEUTROS PCT: 49 % (ref 43–77)
Platelets: 273 10*3/uL (ref 150–400)
RBC: 4.76 MIL/uL (ref 3.87–5.11)
RDW: 14.6 % (ref 11.5–15.5)
WBC: 9.4 10*3/uL (ref 4.0–10.5)

## 2014-11-25 LAB — BASIC METABOLIC PANEL
BUN: 15 mg/dL (ref 7–25)
CALCIUM: 9.4 mg/dL (ref 8.6–10.4)
CO2: 26 mmol/L (ref 20–31)
Chloride: 103 mmol/L (ref 98–110)
Creat: 0.9 mg/dL (ref 0.50–0.99)
Glucose, Bld: 109 mg/dL — ABNORMAL HIGH (ref 65–99)
POTASSIUM: 3.8 mmol/L (ref 3.5–5.3)
SODIUM: 142 mmol/L (ref 135–146)

## 2015-01-04 ENCOUNTER — Ambulatory Visit (INDEPENDENT_AMBULATORY_CARE_PROVIDER_SITE_OTHER): Payer: Medicare Other | Admitting: *Deleted

## 2015-01-04 DIAGNOSIS — Z953 Presence of xenogenic heart valve: Secondary | ICD-10-CM

## 2015-01-04 DIAGNOSIS — Z954 Presence of other heart-valve replacement: Secondary | ICD-10-CM

## 2015-01-04 DIAGNOSIS — I2699 Other pulmonary embolism without acute cor pulmonale: Secondary | ICD-10-CM

## 2015-01-04 DIAGNOSIS — Z7901 Long term (current) use of anticoagulants: Secondary | ICD-10-CM | POA: Diagnosis not present

## 2015-01-04 DIAGNOSIS — I4891 Unspecified atrial fibrillation: Secondary | ICD-10-CM | POA: Diagnosis not present

## 2015-01-04 LAB — POCT INR: INR: 2.8

## 2015-02-02 ENCOUNTER — Other Ambulatory Visit (HOSPITAL_COMMUNITY): Payer: Medicare Other

## 2015-02-22 ENCOUNTER — Ambulatory Visit (INDEPENDENT_AMBULATORY_CARE_PROVIDER_SITE_OTHER): Payer: Medicare Other | Admitting: *Deleted

## 2015-02-22 DIAGNOSIS — Z7901 Long term (current) use of anticoagulants: Secondary | ICD-10-CM

## 2015-02-22 DIAGNOSIS — I4891 Unspecified atrial fibrillation: Secondary | ICD-10-CM | POA: Diagnosis not present

## 2015-02-22 DIAGNOSIS — Z953 Presence of xenogenic heart valve: Secondary | ICD-10-CM

## 2015-02-22 DIAGNOSIS — Z954 Presence of other heart-valve replacement: Secondary | ICD-10-CM

## 2015-02-22 DIAGNOSIS — I2699 Other pulmonary embolism without acute cor pulmonale: Secondary | ICD-10-CM | POA: Diagnosis not present

## 2015-02-22 LAB — POCT INR: INR: 2.2

## 2015-02-22 MED ORDER — WARFARIN SODIUM 2 MG PO TABS: ORAL_TABLET | ORAL | Status: DC

## 2015-02-24 ENCOUNTER — Encounter (HOSPITAL_COMMUNITY): Payer: Medicare Other

## 2015-03-02 ENCOUNTER — Encounter (HOSPITAL_COMMUNITY): Payer: Self-pay | Admitting: Cardiology

## 2015-03-29 ENCOUNTER — Encounter (HOSPITAL_COMMUNITY): Payer: Medicare Other

## 2015-03-29 DIAGNOSIS — R05 Cough: Secondary | ICD-10-CM | POA: Diagnosis not present

## 2015-03-29 DIAGNOSIS — Z6831 Body mass index (BMI) 31.0-31.9, adult: Secondary | ICD-10-CM | POA: Diagnosis not present

## 2015-03-31 ENCOUNTER — Other Ambulatory Visit (HOSPITAL_COMMUNITY): Payer: Medicare Other

## 2015-04-05 ENCOUNTER — Ambulatory Visit (INDEPENDENT_AMBULATORY_CARE_PROVIDER_SITE_OTHER): Payer: Medicare Other | Admitting: *Deleted

## 2015-04-05 DIAGNOSIS — Z7901 Long term (current) use of anticoagulants: Secondary | ICD-10-CM

## 2015-04-05 DIAGNOSIS — Z954 Presence of other heart-valve replacement: Secondary | ICD-10-CM | POA: Diagnosis not present

## 2015-04-05 DIAGNOSIS — I2699 Other pulmonary embolism without acute cor pulmonale: Secondary | ICD-10-CM

## 2015-04-05 DIAGNOSIS — I4891 Unspecified atrial fibrillation: Secondary | ICD-10-CM

## 2015-04-05 DIAGNOSIS — Z953 Presence of xenogenic heart valve: Secondary | ICD-10-CM

## 2015-04-05 LAB — POCT INR: INR: 2.8

## 2015-04-21 ENCOUNTER — Other Ambulatory Visit: Payer: Self-pay

## 2015-04-21 ENCOUNTER — Ambulatory Visit (HOSPITAL_COMMUNITY): Payer: Medicare Other | Attending: Cardiovascular Disease

## 2015-04-21 DIAGNOSIS — E669 Obesity, unspecified: Secondary | ICD-10-CM | POA: Diagnosis not present

## 2015-04-21 DIAGNOSIS — F17200 Nicotine dependence, unspecified, uncomplicated: Secondary | ICD-10-CM | POA: Diagnosis not present

## 2015-04-21 DIAGNOSIS — E785 Hyperlipidemia, unspecified: Secondary | ICD-10-CM | POA: Insufficient documentation

## 2015-04-21 DIAGNOSIS — I517 Cardiomegaly: Secondary | ICD-10-CM | POA: Diagnosis not present

## 2015-04-21 DIAGNOSIS — R0989 Other specified symptoms and signs involving the circulatory and respiratory systems: Secondary | ICD-10-CM | POA: Diagnosis not present

## 2015-04-21 DIAGNOSIS — Z954 Presence of other heart-valve replacement: Secondary | ICD-10-CM | POA: Insufficient documentation

## 2015-04-21 DIAGNOSIS — I10 Essential (primary) hypertension: Secondary | ICD-10-CM | POA: Insufficient documentation

## 2015-04-21 DIAGNOSIS — Z6832 Body mass index (BMI) 32.0-32.9, adult: Secondary | ICD-10-CM | POA: Insufficient documentation

## 2015-04-21 DIAGNOSIS — Z953 Presence of xenogenic heart valve: Secondary | ICD-10-CM

## 2015-05-17 ENCOUNTER — Ambulatory Visit (INDEPENDENT_AMBULATORY_CARE_PROVIDER_SITE_OTHER): Payer: Medicare Other | Admitting: *Deleted

## 2015-05-17 DIAGNOSIS — Z954 Presence of other heart-valve replacement: Secondary | ICD-10-CM

## 2015-05-17 DIAGNOSIS — I2699 Other pulmonary embolism without acute cor pulmonale: Secondary | ICD-10-CM

## 2015-05-17 DIAGNOSIS — Z7901 Long term (current) use of anticoagulants: Secondary | ICD-10-CM

## 2015-05-17 DIAGNOSIS — I4891 Unspecified atrial fibrillation: Secondary | ICD-10-CM

## 2015-05-17 DIAGNOSIS — Z953 Presence of xenogenic heart valve: Secondary | ICD-10-CM

## 2015-05-17 LAB — POCT INR: INR: 2.5

## 2015-05-20 ENCOUNTER — Other Ambulatory Visit: Payer: Self-pay | Admitting: *Deleted

## 2015-05-20 DIAGNOSIS — E7800 Pure hypercholesterolemia, unspecified: Secondary | ICD-10-CM

## 2015-05-20 DIAGNOSIS — I480 Paroxysmal atrial fibrillation: Secondary | ICD-10-CM

## 2015-05-20 DIAGNOSIS — Z953 Presence of xenogenic heart valve: Secondary | ICD-10-CM

## 2015-05-20 DIAGNOSIS — F17200 Nicotine dependence, unspecified, uncomplicated: Secondary | ICD-10-CM

## 2015-05-20 MED ORDER — AMLODIPINE BESYLATE 5 MG PO TABS: 5.0000 mg | ORAL_TABLET | Freq: Every day | ORAL | Status: DC

## 2015-05-20 MED ORDER — PANTOPRAZOLE SODIUM 40 MG PO TBEC: 40.0000 mg | DELAYED_RELEASE_TABLET | Freq: Two times a day (BID) | ORAL | Status: DC

## 2015-06-28 ENCOUNTER — Ambulatory Visit (INDEPENDENT_AMBULATORY_CARE_PROVIDER_SITE_OTHER): Payer: Medicare Other | Admitting: *Deleted

## 2015-06-28 DIAGNOSIS — Z954 Presence of other heart-valve replacement: Secondary | ICD-10-CM

## 2015-06-28 DIAGNOSIS — Z7901 Long term (current) use of anticoagulants: Secondary | ICD-10-CM | POA: Diagnosis not present

## 2015-06-28 DIAGNOSIS — Z953 Presence of xenogenic heart valve: Secondary | ICD-10-CM

## 2015-06-28 DIAGNOSIS — I2699 Other pulmonary embolism without acute cor pulmonale: Secondary | ICD-10-CM

## 2015-06-28 DIAGNOSIS — I4891 Unspecified atrial fibrillation: Secondary | ICD-10-CM | POA: Diagnosis not present

## 2015-06-28 LAB — POCT INR: INR: 2.2

## 2015-07-06 ENCOUNTER — Encounter (HOSPITAL_COMMUNITY): Payer: Self-pay | Admitting: Cardiology

## 2015-07-19 ENCOUNTER — Telehealth: Payer: Self-pay | Admitting: *Deleted

## 2015-07-19 MED ORDER — WARFARIN SODIUM 2 MG PO TABS: ORAL_TABLET | ORAL | Status: DC

## 2015-07-19 NOTE — Telephone Encounter (Signed)
1. Which medications need to be refilled? (please list name of each medication and dose if known)   warfarin (COUMADIN) 2 MG tablet [id number]       2. Which pharmacy/location (including street and city if local pharmacy) is medication to be sent to? [facility] [place] [facility]. Do they need a 30 day or 90 day supply?

## 2015-08-11 ENCOUNTER — Ambulatory Visit (INDEPENDENT_AMBULATORY_CARE_PROVIDER_SITE_OTHER): Payer: Medicare Other | Admitting: *Deleted

## 2015-08-11 DIAGNOSIS — Z7901 Long term (current) use of anticoagulants: Secondary | ICD-10-CM | POA: Diagnosis not present

## 2015-08-11 DIAGNOSIS — Z953 Presence of xenogenic heart valve: Secondary | ICD-10-CM

## 2015-08-11 DIAGNOSIS — I2699 Other pulmonary embolism without acute cor pulmonale: Secondary | ICD-10-CM | POA: Diagnosis not present

## 2015-08-11 DIAGNOSIS — I4891 Unspecified atrial fibrillation: Secondary | ICD-10-CM

## 2015-08-11 DIAGNOSIS — Z954 Presence of other heart-valve replacement: Secondary | ICD-10-CM

## 2015-08-11 LAB — POCT INR: INR: 2.3

## 2015-09-29 ENCOUNTER — Ambulatory Visit (INDEPENDENT_AMBULATORY_CARE_PROVIDER_SITE_OTHER): Payer: Medicare Other | Admitting: Pharmacist

## 2015-09-29 DIAGNOSIS — Z7901 Long term (current) use of anticoagulants: Secondary | ICD-10-CM

## 2015-09-29 DIAGNOSIS — I4891 Unspecified atrial fibrillation: Secondary | ICD-10-CM

## 2015-09-29 DIAGNOSIS — Z954 Presence of other heart-valve replacement: Secondary | ICD-10-CM | POA: Diagnosis not present

## 2015-09-29 DIAGNOSIS — I2699 Other pulmonary embolism without acute cor pulmonale: Secondary | ICD-10-CM

## 2015-09-29 DIAGNOSIS — Z953 Presence of xenogenic heart valve: Secondary | ICD-10-CM

## 2015-09-29 LAB — POCT INR: INR: 2.4

## 2015-10-11 ENCOUNTER — Other Ambulatory Visit: Payer: Self-pay | Admitting: Cardiology

## 2015-10-11 DIAGNOSIS — Z953 Presence of xenogenic heart valve: Secondary | ICD-10-CM

## 2015-10-11 DIAGNOSIS — F17200 Nicotine dependence, unspecified, uncomplicated: Secondary | ICD-10-CM

## 2015-10-11 DIAGNOSIS — I480 Paroxysmal atrial fibrillation: Secondary | ICD-10-CM

## 2015-10-11 DIAGNOSIS — E7800 Pure hypercholesterolemia, unspecified: Secondary | ICD-10-CM

## 2015-10-11 MED ORDER — METOPROLOL SUCCINATE ER 25 MG PO TB24: 25.0000 mg | ORAL_TABLET | Freq: Two times a day (BID) | ORAL | 2 refills | Status: DC

## 2015-10-11 NOTE — Telephone Encounter (Signed)
Rx(s) sent to pharmacy electronically.  

## 2015-10-11 NOTE — Telephone Encounter (Signed)
°*  STAT* If patient is at the pharmacy, call can be transferred to refill team.   1. Which medications need to be refilled? (please list name of each medication and dose if known) Metoprolol 25mg  tablets   2. Which pharmacy/location (including street and city if local pharmacy) is [facility] to be sent to[facility] [place]   3. Do they need a 30 day or 90 day supply? [place]

## 2015-11-10 ENCOUNTER — Ambulatory Visit (INDEPENDENT_AMBULATORY_CARE_PROVIDER_SITE_OTHER): Payer: Medicare Other | Admitting: *Deleted

## 2015-11-10 DIAGNOSIS — Z953 Presence of xenogenic heart valve: Secondary | ICD-10-CM

## 2015-11-10 DIAGNOSIS — I4891 Unspecified atrial fibrillation: Secondary | ICD-10-CM | POA: Diagnosis not present

## 2015-11-10 DIAGNOSIS — Z954 Presence of other heart-valve replacement: Secondary | ICD-10-CM

## 2015-11-10 DIAGNOSIS — Z7901 Long term (current) use of anticoagulants: Secondary | ICD-10-CM | POA: Diagnosis not present

## 2015-11-10 DIAGNOSIS — I2699 Other pulmonary embolism without acute cor pulmonale: Secondary | ICD-10-CM

## 2015-11-10 LAB — POCT INR: INR: 2.5

## 2015-11-10 MED ORDER — WARFARIN SODIUM 2 MG PO TABS: ORAL_TABLET | ORAL | 3 refills | Status: DC

## 2015-12-15 NOTE — Progress Notes (Signed)
HPI: FU AVR and PAF. She has a history of aortic stenosis, status post Pericardial tissue aortic valve replacement in April 2013. LHC in 06/2011 demonstrated no coronary artery disease. She was readmitted to the hospital later that month with atrial fibrillation with rapid ventricular rate as well as bilateral pulmonary emboli. She was placed on Coumadin. Patient seen by Richardson Dopp in July 2013 with episode of AFib with a heart rate of 134 during exercise at cardiac rehabilitation. There was a question of sinus of Valsalva aneurysm on previous echocardiogram and CT recommended but patient declined. Echocardiogram February 2017 showed normal LV systolic function, mild biatrial enlargement, prosthetic aortic valve with mean gradient 24 mmHg. Carotid Dopplers ordered at last office visit but not performed. Since I last saw her, the patient has dyspnea with more extreme activities but not with routine activities. It is relieved with rest. It is not associated with chest pain. There is no orthopnea, PND or pedal edema. There is no syncope or palpitations. There is no exertional chest pain.   Current Outpatient Prescriptions  Medication Sig Dispense Refill  . amLODipine (NORVASC) 5 MG tablet Take 1 tablet (5 mg total) by mouth daily. 30 tablet 6  . amoxicillin (AMOXIL) 500 MG capsule Take 4 tablets = 2 grams 30-60 minutes before dental procedures 4 capsule 6  . metoprolol succinate (TOPROL-XL) 25 MG 24 hr tablet Take 1 tablet (25 mg total) by mouth every 12 (twelve) hours. 60 tablet 2  . pantoprazole (PROTONIX) 40 MG tablet Take 1 tablet (40 mg total) by mouth 2 (two) times daily. For reflux 60 tablet 6  . warfarin (COUMADIN) 2 MG tablet Take 2 tablets daily except 1 1/2 on Sundays, Tuesdays and Thursdays 60 tablet 3   No current facility-administered medications for this visit.      Past Medical History:  Diagnosis Date  . Aortic stenosis    a. echo 1/13: normal EF, severe AS, mean gradient  50 mmHg => s/p AVR 4/13;   b.Echo 07/14/11: Mod LVH, EF 55-60%, grade 2 diast dysfxn, AVR mean gradient 21 mmHg, mod to severe LAE, mild RVE, mod RAE, PASP 32   . Arthritis   . Atrial fibrillation ([place])    Post operative following AVR  . Bell's palsy 2005  . GERD (gastroesophageal reflux disease)   . HTN (hypertension), benign   . Hx of cardiac cath    [facility] 4/13:  no CAD, severe AS  . Hypercholesteremia   . Obesity (BMI 30.0-34.9) 06/29/2011  . Pulmonary embolus (HCC)    following AVR  . S/P aortic valve replacement with bioprosthetic valve 07/05/2011   61mm [facility] Magna Ease pericardial tissue valve    Past Surgical History:  Procedure Laterality Date  . AORTIC VALVE REPLACEMENT  07/05/2011   Procedure: AORTIC VALVE REPLACEMENT (AVR);  Surgeon: Rexene Alberts, MD;  Location: [place];  Service: Open Heart Surgery;  Laterality: N/A;  . CATARACT EXTRACTION     bil  . KNEE ARTHROSCOPY  lft  . PARS PLANA VITRECTOMY W/ REPAIR OF MACULAR HOLE      Social History   Social History  . Marital status: Married    Spouse name: N/A  . Number of children: N/A  . Years of education: N/A   Occupational History  . hospice    Social History Main Topics  . Smoking status: Former Smoker    Packs/day: 1.00    Years: 45.00    Types: Cigarettes  Quit date: 07/04/2011  . Smokeless tobacco: Never Used  . Alcohol use 0.5 oz/week    1 drink(s) per week     Comment: rare  . Drug use: Unknown  . Sexual activity: Yes    Birth control/ protection: Post-menopausal   Other Topics Concern  . Not on file   Social History Narrative  . No narrative on file    Family History  Problem Relation Age of Onset  . Stroke Father   . Heart attack Mother     ROS: no fevers or chills, productive cough, hemoptysis, dysphasia, odynophagia, melena, hematochezia, dysuria, hematuria, rash, seizure activity, orthopnea, PND, pedal edema, claudication. Remaining systems are negative.  Physical  Exam: Well-developed well-nourished in no acute distress.  Skin is warm and dry.  HEENT is normal.  Neck is supple. Bilateral bruits Chest is clear to auscultation with normal expansion.  Cardiovascular exam is regular rate and rhythm. 3/6 systolic murmur left sternal border. S2 is not diminished. No diastolic murmur. Abdominal exam nontender or distended. No masses palpated. Extremities show no edema. neuro grossly intact  ECG-Sinus rhythm at a rate of 71. Occasional PAC. Nonspecific ST changes. Cannot rule out prior septal infarct.  A/P  1 Hyperlipidemia-patient has declined statins previously.  2 tobacco abuse-patient counseled on discontinuing.  3 Hypertension-blood pressure mildly elevated. She will track this at home and if it remains elevated we will advance amlodipine. Check potassium and renal function.  4 prior aortic valve replacement-continue SBE prophylaxis.  5 history of paroxysmal atrial fibrillation-patient remains in sinus rhythm. Continue beta blocker and Coumadin. Check hemoglobin.  [place] carotid dopplers  Kirk Ruths, MD

## 2015-12-21 ENCOUNTER — Ambulatory Visit (INDEPENDENT_AMBULATORY_CARE_PROVIDER_SITE_OTHER): Payer: Medicare Other | Admitting: Cardiology

## 2015-12-21 ENCOUNTER — Encounter: Payer: Self-pay | Admitting: Cardiology

## 2015-12-21 VITALS — BP 156/72 | HR 71 | Ht 62.0 in | Wt 164.0 lb

## 2015-12-21 DIAGNOSIS — Z953 Presence of xenogenic heart valve: Secondary | ICD-10-CM

## 2015-12-21 DIAGNOSIS — I10 Essential (primary) hypertension: Secondary | ICD-10-CM | POA: Diagnosis not present

## 2015-12-21 DIAGNOSIS — E7800 Pure hypercholesterolemia, unspecified: Secondary | ICD-10-CM

## 2015-12-21 DIAGNOSIS — E7849 Other hyperlipidemia: Secondary | ICD-10-CM

## 2015-12-21 DIAGNOSIS — I480 Paroxysmal atrial fibrillation: Secondary | ICD-10-CM

## 2015-12-21 DIAGNOSIS — F17200 Nicotine dependence, unspecified, uncomplicated: Secondary | ICD-10-CM

## 2015-12-21 DIAGNOSIS — E784 Other hyperlipidemia: Secondary | ICD-10-CM | POA: Diagnosis not present

## 2015-12-21 DIAGNOSIS — R0989 Other specified symptoms and signs involving the circulatory and respiratory systems: Secondary | ICD-10-CM

## 2015-12-21 LAB — CBC
HCT: 44.1 % (ref 35.0–45.0)
Hemoglobin: 14.7 g/dL (ref 11.7–15.5)
MCH: 30.6 pg (ref 27.0–33.0)
MCHC: 33.3 g/dL (ref 32.0–36.0)
MCV: 91.9 fL (ref 80.0–100.0)
MPV: 10.1 fL (ref 7.5–12.5)
PLATELETS: 260 10*3/uL (ref 140–400)
RBC: 4.8 MIL/uL (ref 3.80–5.10)
RDW: 14.3 % (ref 11.0–15.0)
WBC: 8.7 10*3/uL (ref 3.8–10.8)

## 2015-12-21 MED ORDER — METOPROLOL SUCCINATE ER 25 MG PO TB24: 25.0000 mg | ORAL_TABLET | Freq: Two times a day (BID) | ORAL | 11 refills | Status: DC

## 2015-12-21 MED ORDER — PANTOPRAZOLE SODIUM 40 MG PO TBEC: 40.0000 mg | DELAYED_RELEASE_TABLET | Freq: Two times a day (BID) | ORAL | 11 refills | Status: DC

## 2015-12-21 MED ORDER — AMLODIPINE BESYLATE 5 MG PO TABS: 5.0000 mg | ORAL_TABLET | Freq: Every day | ORAL | 11 refills | Status: DC

## 2015-12-21 MED ORDER — AMOXICILLIN 500 MG PO CAPS: ORAL_CAPSULE | ORAL | 6 refills | Status: DC

## 2015-12-21 NOTE — Patient Instructions (Signed)
Medication Instructions:   NO CHANGE= REFILLS SENT TO THE PHARMACY  Labwork:  Your physician recommends that you HAVE LAB WORK TODAY  Testing/Procedures:  Your physician has requested that you have a carotid duplex. This test is an ultrasound of the carotid arteries in your neck. It looks at blood flow through these arteries that supply the brain with blood. Allow one hour for this exam. There are no restrictions or special instructions.    Follow-Up:  Your physician wants you to follow-up in: [place] will receive a reminder letter in the mail two months in advance. If you don't receive a letter, please call our office to schedule the follow-up appointment.   If you need a refill on your cardiac medications before your next appointment, please call your pharmacy.

## 2015-12-22 LAB — BASIC METABOLIC PANEL
BUN: 8 mg/dL (ref 7–25)
CALCIUM: 9.4 mg/dL (ref 8.6–10.4)
CO2: 28 mmol/L (ref 20–31)
Chloride: 103 mmol/L (ref 98–110)
Creat: 0.85 mg/dL (ref 0.50–0.99)
Glucose, Bld: 97 mg/dL (ref 65–99)
POTASSIUM: 4.3 mmol/L (ref 3.5–5.3)
SODIUM: 140 mmol/L (ref 135–146)

## 2015-12-29 ENCOUNTER — Ambulatory Visit (INDEPENDENT_AMBULATORY_CARE_PROVIDER_SITE_OTHER): Payer: Medicare Other | Admitting: *Deleted

## 2015-12-29 DIAGNOSIS — Z7901 Long term (current) use of anticoagulants: Secondary | ICD-10-CM

## 2015-12-29 DIAGNOSIS — Z953 Presence of xenogenic heart valve: Secondary | ICD-10-CM

## 2015-12-29 DIAGNOSIS — I4891 Unspecified atrial fibrillation: Secondary | ICD-10-CM

## 2015-12-29 DIAGNOSIS — I2699 Other pulmonary embolism without acute cor pulmonale: Secondary | ICD-10-CM | POA: Diagnosis not present

## 2015-12-29 LAB — POCT INR: INR: 2.3

## 2016-01-04 ENCOUNTER — Ambulatory Visit (HOSPITAL_COMMUNITY)
Admission: RE | Admit: 2016-01-04 | Discharge: 2016-01-04 | Disposition: A | Discharge status: Home / Self Care | Payer: Medicare Other | Source: Ambulatory Visit | Attending: Cardiovascular Disease | Admitting: Cardiovascular Disease

## 2016-01-04 DIAGNOSIS — Z87891 Personal history of nicotine dependence: Secondary | ICD-10-CM | POA: Diagnosis not present

## 2016-01-04 DIAGNOSIS — R0989 Other specified symptoms and signs involving the circulatory and respiratory systems: Secondary | ICD-10-CM

## 2016-01-04 DIAGNOSIS — I10 Essential (primary) hypertension: Secondary | ICD-10-CM | POA: Insufficient documentation

## 2016-02-09 ENCOUNTER — Ambulatory Visit (INDEPENDENT_AMBULATORY_CARE_PROVIDER_SITE_OTHER): Payer: Medicare Other | Admitting: *Deleted

## 2016-02-09 DIAGNOSIS — I2699 Other pulmonary embolism without acute cor pulmonale: Secondary | ICD-10-CM

## 2016-02-09 DIAGNOSIS — I4891 Unspecified atrial fibrillation: Secondary | ICD-10-CM

## 2016-02-09 DIAGNOSIS — Z7901 Long term (current) use of anticoagulants: Secondary | ICD-10-CM

## 2016-02-09 DIAGNOSIS — Z953 Presence of xenogenic heart valve: Secondary | ICD-10-CM | POA: Diagnosis not present

## 2016-02-09 LAB — POCT INR: INR: 2.4

## 2016-03-01 DIAGNOSIS — N3000 Acute cystitis without hematuria: Secondary | ICD-10-CM | POA: Diagnosis not present

## 2016-03-02 ENCOUNTER — Ambulatory Visit (HOSPITAL_COMMUNITY)
Admission: RE | Admit: 2016-03-02 | Discharge: 2016-03-02 | Disposition: A | Discharge status: Home / Self Care | Payer: Medicare Other | Source: Ambulatory Visit | Attending: Internal Medicine | Admitting: Internal Medicine

## 2016-03-02 ENCOUNTER — Other Ambulatory Visit (HOSPITAL_COMMUNITY): Payer: Self-pay | Admitting: Internal Medicine

## 2016-03-02 DIAGNOSIS — R1032 Left lower quadrant pain: Secondary | ICD-10-CM

## 2016-03-02 DIAGNOSIS — R102 Pelvic and perineal pain: Secondary | ICD-10-CM | POA: Diagnosis not present

## 2016-03-02 DIAGNOSIS — M25552 Pain in left hip: Secondary | ICD-10-CM | POA: Diagnosis not present

## 2016-03-04 ENCOUNTER — Emergency Department (HOSPITAL_COMMUNITY)
Admission: EM | Admit: 2016-03-04 | Discharge: 2016-03-04 | Disposition: A | Discharge status: Home / Self Care | Payer: Medicare Other | Attending: Emergency Medicine | Admitting: Emergency Medicine

## 2016-03-04 ENCOUNTER — Encounter (HOSPITAL_COMMUNITY): Payer: Self-pay | Admitting: Emergency Medicine

## 2016-03-04 DIAGNOSIS — M79605 Pain in left leg: Secondary | ICD-10-CM | POA: Diagnosis not present

## 2016-03-04 DIAGNOSIS — Z79899 Other long term (current) drug therapy: Secondary | ICD-10-CM | POA: Diagnosis not present

## 2016-03-04 DIAGNOSIS — X58XXXA Exposure to other specified factors, initial encounter: Secondary | ICD-10-CM | POA: Insufficient documentation

## 2016-03-04 DIAGNOSIS — I10 Essential (primary) hypertension: Secondary | ICD-10-CM | POA: Diagnosis not present

## 2016-03-04 DIAGNOSIS — Z87891 Personal history of nicotine dependence: Secondary | ICD-10-CM | POA: Insufficient documentation

## 2016-03-04 DIAGNOSIS — S79912A Unspecified injury of left hip, initial encounter: Secondary | ICD-10-CM | POA: Diagnosis present

## 2016-03-04 DIAGNOSIS — Y939 Activity, unspecified: Secondary | ICD-10-CM | POA: Insufficient documentation

## 2016-03-04 DIAGNOSIS — Z7901 Long term (current) use of anticoagulants: Secondary | ICD-10-CM | POA: Diagnosis not present

## 2016-03-04 DIAGNOSIS — Y999 Unspecified external cause status: Secondary | ICD-10-CM | POA: Diagnosis not present

## 2016-03-04 DIAGNOSIS — M79662 Pain in left lower leg: Secondary | ICD-10-CM | POA: Diagnosis not present

## 2016-03-04 DIAGNOSIS — Y929 Unspecified place or not applicable: Secondary | ICD-10-CM | POA: Diagnosis not present

## 2016-03-04 MED ORDER — ONDANSETRON HCL 4 MG PO TABS
4.0000 mg | ORAL_TABLET | Freq: Three times a day (TID) | ORAL | 0 refills | Status: DC | PRN
Start: 2016-03-04 — End: 2018-02-19

## 2016-03-04 MED ORDER — HYDROCODONE-ACETAMINOPHEN 5-325 MG PO TABS: 1.0000 | ORAL_TABLET | Freq: Four times a day (QID) | ORAL | 0 refills | Status: DC | PRN

## 2016-03-04 MED ORDER — CYCLOBENZAPRINE HCL 10 MG PO TABS
10.0000 mg | ORAL_TABLET | Freq: Once | ORAL | Status: AC
  Administered 2016-03-04: 10 mg via ORAL
  Filled 2016-03-04: qty 1

## 2016-03-04 MED ORDER — HYDROCODONE-ACETAMINOPHEN 5-325 MG PO TABS
1.0000 | ORAL_TABLET | Freq: Once | ORAL | Status: AC
  Administered 2016-03-04: 1 via ORAL
  Filled 2016-03-04: qty 1

## 2016-03-04 MED ORDER — CYCLOBENZAPRINE HCL 10 MG PO TABS: 10.0000 mg | ORAL_TABLET | Freq: Two times a day (BID) | ORAL | 0 refills | Status: DC | PRN

## 2016-03-04 MED ORDER — ONDANSETRON HCL 4 MG PO TABS
4.0000 mg | ORAL_TABLET | Freq: Once | ORAL | Status: AC
  Administered 2016-03-04: 4 mg via ORAL
  Filled 2016-03-04: qty 1

## 2016-03-04 NOTE — ED Provider Notes (Signed)
[facility] DEPT Provider Note   CSN: [id number] Arrival date & time: 03/04/16  1002    By signing my name below, I, Sonum Patel, attest that this documentation has been prepared under the direction and in the presence of Courtney Paris, MD. Electronically Signed: Sonum Patel, Education administrator. 03/04/16. 11:48 AM History   Chief Complaint Chief Complaint  Patient presents with  . Back Pain      The history is provided by the patient. No language interpreter was used.    HPI Comments: Terri Edwards is a 65 y.o. female who presents to the Emergency Department complaining of left hip pain that radiates into her left groin that started about a week ago. She states that she hit her hip on a table and the pain began at that time. She states there was pain initially upon injury but that it did not persist until this episode began 1 week ago. This pain worsens with movement of the left leg and is eased by resting. She talked to her doctor on 03/01/16 and they advised her to come in and a urinalysis was performed which was normal. On 03/02/16 she had a negative x-ray and was started on Tramadol for pain relief. She has taken this medication in half doses without relief. She states that she had 3 episodes of vomiting when she took a full dose.This morning when she woke up, the pain was noted to be very severe so she decided to come to the ED. She denies bruising, numbness or weakness in leg, upper or mid back pain, abdominal pain, hematuria, fever, nausea, vomiting, SOB, CP, and chills. She denies history of kidney stones.   Past Medical History:  Diagnosis Date  . Aortic stenosis    a. echo 1/13: normal EF, severe AS, mean gradient 50 mmHg => s/p AVR 4/13;   b.Echo 07/14/11: Mod LVH, EF 55-60%, grade 2 diast dysfxn, AVR mean gradient 21 mmHg, mod to severe LAE, mild RVE, mod RAE, PASP 32   . Arthritis   . Atrial fibrillation ([place])    Post operative following AVR  . Bell's palsy 2005  .  GERD (gastroesophageal reflux disease)   . HTN (hypertension), benign   . Hx of cardiac cath    [facility] 4/13:  no CAD, severe AS  . Hypercholesteremia   . Obesity (BMI 30.0-34.9) 06/29/2011  . Pulmonary embolus (HCC)    following AVR  . S/P aortic valve replacement with bioprosthetic valve 07/05/2011   32mm [facility] Ease pericardial tissue valve    Patient Active Problem List   Diagnosis Date Noted  . Bruit 11/24/2014  . Dizziness 03/06/2012  . Anemia 03/06/2012  . Sleep apnea 07/28/2011  . Long term (current) use of anticoagulants 07/24/2011  . Pulmonary infiltrates 07/20/2011  . Pulmonary edema 07/17/2011  . Pleural effusion 07/17/2011  . Pulmonary embolism ([place]) 07/17/2011  . Hypoxemia 07/17/2011  . Pneumonia, organism unspecified(486) 07/17/2011  . Atrial Fibrillation 07/13/2011  . Fluid overload 07/06/2011  . S/P aortic valve replacement with bioprosthetic valve 07/05/2011  . Obesity (BMI 30.0-34.9) 06/29/2011  . Pure hypercholesterolemia 06/01/2008  . TOBACCO ABUSE 06/01/2008  . HYPERTENSION, BENIGN ESSENTIAL 06/01/2008  . AORTIC STENOSIS 06/01/2008  . DYSPNEA 06/01/2008    Past Surgical History:  Procedure Laterality Date  . AORTIC VALVE REPLACEMENT  07/05/2011   Procedure: AORTIC VALVE REPLACEMENT (AVR);  Surgeon: Rexene Alberts, MD;  Location: [place];  Service: Open Heart Surgery;  Laterality: N/A;  . CATARACT EXTRACTION  bil  . KNEE ARTHROSCOPY  lft  . PARS PLANA VITRECTOMY W/ REPAIR OF MACULAR HOLE      OB History    Gravida Para Term Preterm AB Living   1 1 1     1    SAB TAB Ectopic Multiple Live Births                   Home Medications    Prior to Admission medications   Medication Sig Start Date End Date Taking? Authorizing Provider  amLODipine (NORVASC) 5 MG tablet Take 1 tablet (5 mg total) by mouth daily. 12/21/15 12/20/16 Yes Lelon Perla, MD  metoprolol succinate (TOPROL-XL) 25 MG 24 hr tablet Take 1 tablet (25 mg total) by mouth  every 12 (twelve) hours. 12/21/15  Yes Lelon Perla, MD  pantoprazole (PROTONIX) 40 MG tablet Take 1 tablet (40 mg total) by mouth 2 (two) times daily. For reflux Patient taking differently: Take 40 mg by mouth 2 (two) times daily as needed. For reflux 12/21/15  Yes Lelon Perla, MD  traMADol (ULTRAM) 50 MG tablet Take 0.5 tablets by mouth 3 (three) times daily.  03/02/16  Yes Historical Provider, MD  warfarin (COUMADIN) 2 MG tablet Take 2 tablets daily except 1 1/2 on Sundays, Tuesdays and Thursdays 11/10/15  Yes Lelon Perla, MD  amoxicillin (AMOXIL) 500 MG capsule Take 4 tablets = 2 grams 30-60 minutes before dental procedures 12/21/15   Lelon Perla, MD    Family History Family History  Problem Relation Age of Onset  . Stroke Father   . Heart attack Mother     Social History Social History  Substance Use Topics  . Smoking status: Former Smoker    Packs/day: 1.00    Years: 45.00    Types: Cigarettes    Quit date: 07/04/2011  . Smokeless tobacco: Never Used  . Alcohol use 0.5 oz/week    1 Standard drinks or equivalent per week     Comment: rare     Allergies   Nickel; Sulfa antibiotics; and Tape   Review of Systems Review of Systems  Constitutional: Negative for chills and fever.  Respiratory: Negative for cough and shortness of breath.   Cardiovascular: Negative for chest pain.  Gastrointestinal: Negative for abdominal pain, nausea and vomiting.  Musculoskeletal: Positive for arthralgias and myalgias. Negative for back pain and joint swelling.     Physical Exam Updated Vital Signs BP 132/71 (BP Location: Right Arm)   Pulse 72   Temp 98 F (36.7 C) (Oral)   Resp 20   Ht 5\' 2"  (1.575 m)   Wt 166 lb (75.3 kg)   LMP 04/29/1991   SpO2 99%   BMI 30.36 kg/m   Physical Exam  Constitutional: She is oriented to person, place, and time. She appears well-developed and well-nourished. No distress.  HENT:  Head: Normocephalic and atraumatic.  Right Ear:  External ear normal.  Left Ear: External ear normal.  Nose: Nose normal.  Mouth/Throat: Oropharynx is clear and moist. No oropharyngeal exudate.  Eyes: Conjunctivae and EOM are normal. Pupils are equal, round, and reactive to light.  Neck: Normal range of motion. Neck supple.  Cardiovascular: Normal rate, regular rhythm and normal heart sounds.   Pulmonary/Chest: Effort normal and breath sounds normal. No stridor. No respiratory distress.  Abdominal: She exhibits no distension. There is no tenderness. There is no rebound.  Musculoskeletal:  Tenderness in left iliac region and left hip. No bony tenderness. Negative SLR.  Tenderness in her left gluteus. No midline lumbar spinal tenderness. No edema, erythema, or ecchymosis. Normal pulses, strength and sensation.   Neurological: She is alert and oriented to person, place, and time. She has normal reflexes. She exhibits normal muscle tone. Coordination normal.  Skin: Skin is warm. No rash noted. She is not diaphoretic. No erythema.  Nursing note and vitals reviewed.  ED Treatments / Results  DIAGNOSTIC STUDIES: Oxygen Saturation is 99% on room air, adequate by my interpretation.    COORDINATION OF CARE: 10:59 AM Discussed treatment plan with pt at bedside and pt agreed to plan.  Labs (all labs ordered are listed, but only abnormal results are displayed) Labs Reviewed - No data to display  EKG  EKG Interpretation None       Radiology Dg Hip Unilat With Pelvis Min 4 Views Left  Result Date: 03/03/2016 CLINICAL DATA:  One week of left hip and groin pain extending posteriorly following direct trauma to the left hip. No history of a fall or other previous injury. EXAM: DG HIP (WITH OR WITHOUT PELVIS) 4+V LEFT COMPARISON:  None in PACs FINDINGS: The bony pelvis is subjectively adequately mineralized. There is no lytic or blastic lesion. There is no acute or old fracture. AP and lateral views of the left hip reveal preservation of the joint  space. The articular surfaces of the femoral head and acetabulum remains smoothly rounded. The femoral neck, intertrochanteric, and subtrochanteric regions are normal. The overlying soft tissues are unremarkable. IMPRESSION: There is no acute or significant chronic bony abnormality of the left hip. Incidental note is made of degenerative change of the lower lumbar spine as well as calcification in the wall of the common iliac arteries. Electronically Signed   By: David  Martinique M.D.   On: 03/03/2016 08:23    Procedures Procedures (including critical care time)  Medications Ordered in ED Medications  HYDROcodone-acetaminophen (NORCO/VICODIN) 5-325 MG per tablet 1 tablet (1 tablet Oral Given 03/04/16 1251)  cyclobenzaprine (FLEXERIL) tablet 10 mg (10 mg Oral Given 03/04/16 1251)  ondansetron (ZOFRAN) tablet 4 mg (4 mg Oral Given 03/04/16 1424)     Initial Impression / Assessment and Plan / ED Course  I have reviewed the triage vital signs and the nursing notes.  Pertinent labs & imaging results that were available during my care of the patient were reviewed by me and considered in my medical decision making (see chart for details).  Clinical Course     Terri Edwards is a 65 y.o. female who presents to the Emergency Department complaining of left hip pain. Patient reports pain started after bumping a table last week and she had a negative x-ray of her hip yesterday. Patient reports continued pain prompting her to seek valuation.   History and exam are seen above. Patient has mild muscle tenderness in the anterior and lateral hip. Patient has no pain with knee palpation. Patient has normal pulses, sensation, and strength of the work from these. Negative straight leg raise. No tenderness on back. No bruising or signs of expanding hematoma.  Patient is on blood thinners however, given lack of bruising or swelling, doubt hematoma. Doubt DVT given lack of edema or leg pain. Suspect muscular  type injury given sensitivity to palpation and pain when she uses the muscles. Patient had no bony tenderness and no pain with passive movement of the hip joint. Doubt fracture.  Shared decision-making conversation held for CT of the hip to look for occult fracture on imaging versus  trial of medications. Patient given pain medicine, nausea medicine, and muscle relaxant with improvement in symptoms. Patient will try and outpatient regimen of these medications and follow up with her PCP. Patient informed that she may need to follow up with a orthopedic physician for further management.   Patient understood good return precautions as well as follow-up plans. Patient had no other questions or concerns and was discharged in good condition.    Final Clinical Impressions(s) / ED Diagnoses   Final diagnoses:  Left leg pain    New Prescriptions Discharge Medication List as of 03/04/2016  3:18 PM    START taking these medications   Details  cyclobenzaprine (FLEXERIL) 10 MG tablet Take 1 tablet (10 mg total) by mouth 2 (two) times daily as needed for muscle spasms., Starting Sat 03/04/2016, Print    HYDROcodone-acetaminophen (NORCO/VICODIN) 5-325 MG tablet Take 1 tablet by mouth every 6 (six) hours as needed., Starting Sat 03/04/2016, Print    ondansetron (ZOFRAN) 4 MG tablet Take 1 tablet (4 mg total) by mouth every 8 (eight) hours as needed for nausea or vomiting., Starting Sat 03/04/2016, Print        I personally performed the services described in this documentation, which was scribed in my presence. The recorded information has been reviewed and is accurate.  Clinical Impression: 1. Left leg pain     Disposition: Discharge  Condition: Good  I have discussed the results, Dx and Tx plan with the pt(& family if present). He/she/they expressed understanding and agree(s) with the plan. Discharge instructions discussed at great length. Strict return precautions discussed and pt &/or family  have verbalized understanding of the instructions. No further questions at time of discharge.    Discharge Medication List as of 03/04/2016  3:18 PM    START taking these medications   Details  cyclobenzaprine (FLEXERIL) 10 MG tablet Take 1 tablet (10 mg total) by mouth 2 (two) times daily as needed for muscle spasms., Starting Sat 03/04/2016, Print    HYDROcodone-acetaminophen (NORCO/VICODIN) 5-325 MG tablet Take 1 tablet by mouth every 6 (six) hours as needed., Starting Sat 03/04/2016, Print    ondansetron (ZOFRAN) 4 MG tablet Take 1 tablet (4 mg total) by mouth every 8 (eight) hours as needed for nausea or vomiting., Starting Sat 03/04/2016, Print        Follow Up: Asencion Noble, MD [address] [place] [address] [phone]     [facility] [address] [id number] [place] [id number]  If symptoms worsen      Courtney Paris, MD 03/04/16 2143

## 2016-03-04 NOTE — Discharge Instructions (Signed)
Please follow-up with a primary care physician for further management of your pain. If any symptoms return or worsen, please return to the nearest emergency department. Please take your pain medicine and muscle relaxant as needed for discomfort. Please use for nausea as and as needed to prevent vomiting.

## 2016-03-04 NOTE — ED Triage Notes (Signed)
Patient c/o left hip that radiates into left groin. Per patient started after bumping hip on table. Patient reports seeing PCP-Dr Willey Blade on Thursday and was sent for x-ray of hip, urine checked. Per patient no UTI or fractures. Patient given tramadol for possible pulled muscle. Patient reports no relief with medication or heat.

## 2016-03-22 ENCOUNTER — Ambulatory Visit (INDEPENDENT_AMBULATORY_CARE_PROVIDER_SITE_OTHER): Payer: Medicare Other | Admitting: *Deleted

## 2016-03-22 DIAGNOSIS — I2699 Other pulmonary embolism without acute cor pulmonale: Secondary | ICD-10-CM

## 2016-03-22 DIAGNOSIS — I4891 Unspecified atrial fibrillation: Secondary | ICD-10-CM | POA: Diagnosis not present

## 2016-03-22 DIAGNOSIS — Z7901 Long term (current) use of anticoagulants: Secondary | ICD-10-CM

## 2016-03-22 DIAGNOSIS — Z953 Presence of xenogenic heart valve: Secondary | ICD-10-CM | POA: Diagnosis not present

## 2016-03-22 LAB — POCT INR: INR: 2.4

## 2016-03-22 MED ORDER — WARFARIN SODIUM 2 MG PO TABS: ORAL_TABLET | ORAL | 6 refills | Status: DC

## 2016-05-03 DIAGNOSIS — J441 Chronic obstructive pulmonary disease with (acute) exacerbation: Secondary | ICD-10-CM | POA: Diagnosis not present

## 2016-05-03 DIAGNOSIS — Z6830 Body mass index (BMI) 30.0-30.9, adult: Secondary | ICD-10-CM | POA: Diagnosis not present

## 2016-05-10 ENCOUNTER — Ambulatory Visit (INDEPENDENT_AMBULATORY_CARE_PROVIDER_SITE_OTHER): Payer: Medicare Other | Admitting: *Deleted

## 2016-05-10 DIAGNOSIS — Z7901 Long term (current) use of anticoagulants: Secondary | ICD-10-CM

## 2016-05-10 DIAGNOSIS — I2699 Other pulmonary embolism without acute cor pulmonale: Secondary | ICD-10-CM | POA: Diagnosis not present

## 2016-05-10 DIAGNOSIS — Z953 Presence of xenogenic heart valve: Secondary | ICD-10-CM

## 2016-05-10 DIAGNOSIS — I4891 Unspecified atrial fibrillation: Secondary | ICD-10-CM

## 2016-05-10 LAB — POCT INR: INR: 4.2

## 2016-05-31 ENCOUNTER — Ambulatory Visit (INDEPENDENT_AMBULATORY_CARE_PROVIDER_SITE_OTHER): Payer: Medicare Other | Admitting: *Deleted

## 2016-05-31 DIAGNOSIS — I2699 Other pulmonary embolism without acute cor pulmonale: Secondary | ICD-10-CM | POA: Diagnosis not present

## 2016-05-31 DIAGNOSIS — I4891 Unspecified atrial fibrillation: Secondary | ICD-10-CM | POA: Diagnosis not present

## 2016-05-31 DIAGNOSIS — Z7901 Long term (current) use of anticoagulants: Secondary | ICD-10-CM | POA: Diagnosis not present

## 2016-05-31 DIAGNOSIS — Z953 Presence of xenogenic heart valve: Secondary | ICD-10-CM

## 2016-05-31 LAB — POCT INR: INR: 2.8

## 2016-07-12 ENCOUNTER — Ambulatory Visit (INDEPENDENT_AMBULATORY_CARE_PROVIDER_SITE_OTHER): Payer: Medicare Other | Admitting: *Deleted

## 2016-07-12 DIAGNOSIS — Z953 Presence of xenogenic heart valve: Secondary | ICD-10-CM

## 2016-07-12 DIAGNOSIS — Z7901 Long term (current) use of anticoagulants: Secondary | ICD-10-CM | POA: Diagnosis not present

## 2016-07-12 DIAGNOSIS — I4891 Unspecified atrial fibrillation: Secondary | ICD-10-CM

## 2016-07-12 DIAGNOSIS — I2699 Other pulmonary embolism without acute cor pulmonale: Secondary | ICD-10-CM | POA: Diagnosis not present

## 2016-07-12 LAB — POCT INR: INR: 2.2

## 2016-08-23 ENCOUNTER — Ambulatory Visit (INDEPENDENT_AMBULATORY_CARE_PROVIDER_SITE_OTHER): Payer: Medicare Other | Admitting: *Deleted

## 2016-08-23 DIAGNOSIS — Z953 Presence of xenogenic heart valve: Secondary | ICD-10-CM

## 2016-08-23 DIAGNOSIS — I2699 Other pulmonary embolism without acute cor pulmonale: Secondary | ICD-10-CM

## 2016-08-23 DIAGNOSIS — I4891 Unspecified atrial fibrillation: Secondary | ICD-10-CM | POA: Diagnosis not present

## 2016-08-23 DIAGNOSIS — Z7901 Long term (current) use of anticoagulants: Secondary | ICD-10-CM

## 2016-08-23 LAB — POCT INR: INR: 1.9

## 2016-10-04 ENCOUNTER — Ambulatory Visit (INDEPENDENT_AMBULATORY_CARE_PROVIDER_SITE_OTHER): Payer: Medicare Other | Admitting: *Deleted

## 2016-10-04 DIAGNOSIS — I4891 Unspecified atrial fibrillation: Secondary | ICD-10-CM

## 2016-10-04 DIAGNOSIS — I2699 Other pulmonary embolism without acute cor pulmonale: Secondary | ICD-10-CM | POA: Diagnosis not present

## 2016-10-04 DIAGNOSIS — Z7901 Long term (current) use of anticoagulants: Secondary | ICD-10-CM

## 2016-10-04 DIAGNOSIS — Z953 Presence of xenogenic heart valve: Secondary | ICD-10-CM | POA: Diagnosis not present

## 2016-10-04 LAB — POCT INR: INR: 2.4

## 2016-10-04 MED ORDER — WARFARIN SODIUM 2 MG PO TABS: ORAL_TABLET | ORAL | 6 refills | Status: DC

## 2016-11-02 ENCOUNTER — Telehealth: Payer: Self-pay | Admitting: Pharmacist

## 2016-11-02 NOTE — Telephone Encounter (Signed)
LMTCB to reschedule INR check on 8/29 due to staffing.

## 2016-11-03 NOTE — Telephone Encounter (Signed)
Spoke with patient and rescheduled appt

## 2016-11-15 ENCOUNTER — Ambulatory Visit (INDEPENDENT_AMBULATORY_CARE_PROVIDER_SITE_OTHER): Payer: Medicare Other | Admitting: *Deleted

## 2016-11-15 DIAGNOSIS — I2699 Other pulmonary embolism without acute cor pulmonale: Secondary | ICD-10-CM

## 2016-11-15 DIAGNOSIS — Z953 Presence of xenogenic heart valve: Secondary | ICD-10-CM

## 2016-11-15 DIAGNOSIS — Z7901 Long term (current) use of anticoagulants: Secondary | ICD-10-CM | POA: Diagnosis not present

## 2016-11-15 DIAGNOSIS — I4891 Unspecified atrial fibrillation: Secondary | ICD-10-CM

## 2016-11-15 LAB — POCT INR: INR: 2.7

## 2016-12-27 ENCOUNTER — Ambulatory Visit (INDEPENDENT_AMBULATORY_CARE_PROVIDER_SITE_OTHER): Payer: Medicare Other | Admitting: *Deleted

## 2016-12-27 DIAGNOSIS — Z5181 Encounter for therapeutic drug level monitoring: Secondary | ICD-10-CM | POA: Diagnosis not present

## 2016-12-27 DIAGNOSIS — I4891 Unspecified atrial fibrillation: Secondary | ICD-10-CM | POA: Diagnosis not present

## 2016-12-27 DIAGNOSIS — I2699 Other pulmonary embolism without acute cor pulmonale: Secondary | ICD-10-CM

## 2016-12-27 DIAGNOSIS — Z953 Presence of xenogenic heart valve: Secondary | ICD-10-CM

## 2016-12-27 DIAGNOSIS — Z7901 Long term (current) use of anticoagulants: Secondary | ICD-10-CM

## 2016-12-27 LAB — POCT INR: INR: 3.2

## 2016-12-31 ENCOUNTER — Other Ambulatory Visit: Payer: Self-pay | Admitting: Cardiology

## 2016-12-31 DIAGNOSIS — Z953 Presence of xenogenic heart valve: Secondary | ICD-10-CM

## 2016-12-31 DIAGNOSIS — E7800 Pure hypercholesterolemia, unspecified: Secondary | ICD-10-CM

## 2016-12-31 DIAGNOSIS — I480 Paroxysmal atrial fibrillation: Secondary | ICD-10-CM

## 2016-12-31 DIAGNOSIS — F17200 Nicotine dependence, unspecified, uncomplicated: Secondary | ICD-10-CM

## 2017-01-03 ENCOUNTER — Encounter: Payer: Self-pay | Admitting: Cardiology

## 2017-01-09 NOTE — Progress Notes (Signed)
HPI: FU AVR and PAF. She has a history of aortic stenosis, status post Pericardial tissue aortic valve replacement in April 2013. LHC in 06/2011 demonstrated no coronary artery disease. She was readmitted to the hospital later that month with atrial fibrillation with rapid ventricular rate as well as bilateral pulmonary emboli. She was placed on Coumadin. Patient seen by Terri Edwards in July 2013 with episode of AFib with a heart rate of 134 during exercise at cardiac rehabilitation. There was a question of sinus of Valsalva aneurysm on previous echocardiogram and CT recommended but patient declined. Echocardiogram February 2017 showed normal LV systolic function, mild biatrial enlargement, prosthetic aortic valve with mean gradient 24 mmHg. Carotid Dopplers 10/17 negative. Since I last saw her, patient denies dyspnea, chest pain, palpitations or syncope.   Current Outpatient Prescriptions  Medication Sig Dispense Refill  . albuterol (PROVENTIL HFA;VENTOLIN HFA) 108 (90 Base) MCG/ACT inhaler Inhale 2 puffs into the lungs every 4 (four) hours as needed for wheezing or shortness of breath.    Marland Kitchen amLODipine (NORVASC) 5 MG tablet TAKE 1 TABLET (5 MG TOTAL) BY MOUTH DAILY. 30 tablet 0  . amoxicillin (AMOXIL) 500 MG capsule Take 4 tablets = 2 grams 30-60 minutes before dental procedures 4 capsule 6  . cyclobenzaprine (FLEXERIL) 10 MG tablet Take 1 tablet (10 mg total) by mouth 2 (two) times daily as needed for muscle spasms. 20 tablet 0  . HYDROcodone-acetaminophen (NORCO/VICODIN) 5-325 MG tablet Take 1 tablet by mouth every 6 (six) hours as needed. 30 tablet 0  . metoprolol succinate (TOPROL-XL) 25 MG 24 hr tablet TAKE 1 TABLET (25 MG TOTAL) BY MOUTH EVERY 12 (TWELVE) HOURS. 60 tablet 0  . ondansetron (ZOFRAN) 4 MG tablet Take 1 tablet (4 mg total) by mouth every 8 (eight) hours as needed for nausea or vomiting. 20 tablet 0  . pantoprazole (PROTONIX) 40 MG tablet TAKE 1 TABLET (40 MG TOTAL) BY MOUTH 2  (TWO) TIMES DAILY. FOR REFLUX 60 tablet 0  . traMADol (ULTRAM) 50 MG tablet Take 0.5 tablets by mouth 3 (three) times daily.     Marland Kitchen warfarin (COUMADIN) 2 MG tablet Take 2 tablets daily except 1 1/2 on Sundays and Thursdays 60 tablet 6   No current facility-administered medications for this visit.      Past Medical History:  Diagnosis Date  . Aortic stenosis    a. echo 1/13: normal EF, severe AS, mean gradient 50 mmHg => s/p AVR 4/13;   b.Echo 07/14/11: Mod LVH, EF 55-60%, grade 2 diast dysfxn, AVR mean gradient 21 mmHg, mod to severe LAE, mild RVE, mod RAE, PASP 32   . Arthritis   . Atrial fibrillation ([place])    Post operative following AVR  . Bell's palsy 2005  . GERD (gastroesophageal reflux disease)   . HTN (hypertension), benign   . Hx of cardiac cath    [facility] 4/13:  no CAD, severe AS  . Hypercholesteremia   . Obesity (BMI 30.0-34.9) 06/29/2011  . Pulmonary embolus ([facility])    following AVR  . S/P aortic valve replacement with bioprosthetic valve 07/05/2011   61mm Edwards Magna Ease pericardial tissue valve    Past Surgical History:  Procedure Laterality Date  . AORTIC VALVE REPLACEMENT  07/05/2011   Procedure: AORTIC VALVE REPLACEMENT (AVR);  Surgeon: Terri Alberts, MD;  Location: [place];  Service: Open Heart Surgery;  Laterality: N/A;  . CATARACT EXTRACTION     bil  . KNEE ARTHROSCOPY  lft  . PARS PLANA VITRECTOMY W/ REPAIR OF MACULAR HOLE      Social History   Social History  . Marital status: Married    Spouse name: N/A  . Number of children: N/A  . Years of education: N/A   Occupational History  . hospice    Social History Main Topics  . Smoking status: Former Smoker    Packs/day: 1.00    Years: 45.00    Types: Cigarettes    Quit date: 07/04/2011  . Smokeless tobacco: Never Used  . Alcohol use 0.5 oz/week    1 Standard drinks or equivalent per week     Comment: rare  . Drug use: Unknown  . Sexual activity: Yes    Birth control/ protection: Post-menopausal    Other Topics Concern  . Not on file   Social History Narrative  . No narrative on file    Family History  Problem Relation Age of Onset  . Stroke Father   . Heart attack Mother     ROS: Some cramping in her hands and feet but no fevers or chills, productive cough, hemoptysis, dysphasia, odynophagia, melena, hematochezia, dysuria, hematuria, rash, seizure activity, orthopnea, PND, pedal edema, claudication. Remaining systems are negative.  Physical Exam: Well-developed well-nourished in no acute distress.  Skin is warm and dry.  HEENT is normal.  Neck is supple.  Chest is clear to auscultation with normal expansion.  Cardiovascular exam is regular rate and rhythm. 2/6 systolic murmur left sternal border. Abdominal exam nontender or distended. No masses palpated. Extremities show no edema. neuro grossly intact  ECG- sinus rhythm at a rate of 78. Occasional PACs. Cannot rule out septal infarct. Nonspecific ST changes. personally reviewed  A/P  1 prior aortic valve replacement-continue SBE prophylaxis. Repeat echocardiogram given mildly elevated gradient from previous study.  2 hypertension-blood pressure is mildly elevated. However she states typically control. Continue present medications and follow. Check potassium and renal function.  3 tobacco abuse-patient counseled on discontinuing.  4 hyperlipidemia-patient declines statins.  5 paroxysmal atrial fibrillation-patient remains in sinus rhythm. Continue beta blocker. Continue coumadin; check Hgb.   Terri Ruths, MD

## 2017-01-17 ENCOUNTER — Encounter: Payer: Self-pay | Admitting: Cardiology

## 2017-01-17 ENCOUNTER — Ambulatory Visit (INDEPENDENT_AMBULATORY_CARE_PROVIDER_SITE_OTHER): Payer: Medicare Other | Admitting: Cardiology

## 2017-01-17 VITALS — BP 142/62 | HR 78 | Ht 62.0 in | Wt 157.0 lb

## 2017-01-17 DIAGNOSIS — F17200 Nicotine dependence, unspecified, uncomplicated: Secondary | ICD-10-CM

## 2017-01-17 DIAGNOSIS — E7800 Pure hypercholesterolemia, unspecified: Secondary | ICD-10-CM

## 2017-01-17 DIAGNOSIS — Z952 Presence of prosthetic heart valve: Secondary | ICD-10-CM

## 2017-01-17 DIAGNOSIS — I480 Paroxysmal atrial fibrillation: Secondary | ICD-10-CM

## 2017-01-17 DIAGNOSIS — Z953 Presence of xenogenic heart valve: Secondary | ICD-10-CM | POA: Diagnosis not present

## 2017-01-17 MED ORDER — PANTOPRAZOLE SODIUM 40 MG PO TBEC: 40.0000 mg | DELAYED_RELEASE_TABLET | Freq: Two times a day (BID) | ORAL | 3 refills | Status: DC

## 2017-01-17 MED ORDER — METOPROLOL SUCCINATE ER 25 MG PO TB24: 25.0000 mg | ORAL_TABLET | Freq: Two times a day (BID) | ORAL | 3 refills | Status: DC

## 2017-01-17 MED ORDER — AMOXICILLIN 500 MG PO CAPS: ORAL_CAPSULE | ORAL | 6 refills | Status: DC

## 2017-01-17 MED ORDER — AMLODIPINE BESYLATE 5 MG PO TABS: 5.0000 mg | ORAL_TABLET | Freq: Every day | ORAL | 3 refills | Status: DC

## 2017-01-17 NOTE — Patient Instructions (Signed)
Medication Instructions:   NO CHANGE  Labwork:  Your physician recommends that you HAVE LAB WORK TODAY  Testing/Procedures:  Your physician has requested that you have an echocardiogram. Echocardiography is a painless test that uses sound waves to create images of your heart. It provides your doctor with information about the size and shape of your heart and how well your heart's chambers and valves are working. This procedure takes approximately one hour. There are no restrictions for this procedure.    Follow-Up:  Your physician wants you to follow-up in: [place] will receive a reminder letter in the mail two months in advance. If you don't receive a letter, please call our office to schedule the follow-up appointment.   If you need a refill on your cardiac medications before your next appointment, please call your pharmacy.

## 2017-01-18 ENCOUNTER — Encounter: Payer: Self-pay | Admitting: *Deleted

## 2017-01-18 LAB — CBC
Hematocrit: 43.6 % (ref 34.0–46.6)
Hemoglobin: 14.3 g/dL (ref 11.1–15.9)
MCH: 30.2 pg (ref 26.6–33.0)
MCHC: 32.8 g/dL (ref 31.5–35.7)
MCV: 92 fL (ref 79–97)
PLATELETS: 252 10*3/uL (ref 150–379)
RBC: 4.74 x10E6/uL (ref 3.77–5.28)
RDW: 14.1 % (ref 12.3–15.4)
WBC: 8.7 10*3/uL (ref 3.4–10.8)

## 2017-01-18 LAB — BASIC METABOLIC PANEL
BUN/Creatinine Ratio: 15 (ref 12–28)
BUN: 13 mg/dL (ref 8–27)
CO2: 26 mmol/L (ref 20–29)
CREATININE: 0.89 mg/dL (ref 0.57–1.00)
Calcium: 9.1 mg/dL (ref 8.7–10.3)
Chloride: 99 mmol/L (ref 96–106)
GFR calc Af Amer: 78 mL/min/{1.73_m2} (ref >59)
GFR calc non Af Amer: 67 mL/min/{1.73_m2} (ref >59)
GLUCOSE: 197 mg/dL — AB (ref 65–99)
POTASSIUM: 3.5 mmol/L (ref 3.5–5.2)
SODIUM: 141 mmol/L (ref 134–144)

## 2017-01-24 ENCOUNTER — Ambulatory Visit (INDEPENDENT_AMBULATORY_CARE_PROVIDER_SITE_OTHER): Payer: Medicare Other | Admitting: *Deleted

## 2017-01-24 DIAGNOSIS — I4891 Unspecified atrial fibrillation: Secondary | ICD-10-CM

## 2017-01-24 DIAGNOSIS — Z7901 Long term (current) use of anticoagulants: Secondary | ICD-10-CM

## 2017-01-24 DIAGNOSIS — Z953 Presence of xenogenic heart valve: Secondary | ICD-10-CM

## 2017-01-24 DIAGNOSIS — I2699 Other pulmonary embolism without acute cor pulmonale: Secondary | ICD-10-CM | POA: Diagnosis not present

## 2017-01-24 LAB — POCT INR: INR: 2.6

## 2017-02-05 ENCOUNTER — Ambulatory Visit (HOSPITAL_COMMUNITY): Payer: Medicare Other | Attending: Cardiology

## 2017-02-05 ENCOUNTER — Other Ambulatory Visit: Payer: Self-pay

## 2017-02-05 DIAGNOSIS — Z720 Tobacco use: Secondary | ICD-10-CM | POA: Insufficient documentation

## 2017-02-05 DIAGNOSIS — Z952 Presence of prosthetic heart valve: Secondary | ICD-10-CM | POA: Insufficient documentation

## 2017-02-05 DIAGNOSIS — I2699 Other pulmonary embolism without acute cor pulmonale: Secondary | ICD-10-CM | POA: Insufficient documentation

## 2017-02-05 DIAGNOSIS — I350 Nonrheumatic aortic (valve) stenosis: Secondary | ICD-10-CM | POA: Insufficient documentation

## 2017-02-05 DIAGNOSIS — I4891 Unspecified atrial fibrillation: Secondary | ICD-10-CM | POA: Diagnosis not present

## 2017-02-05 DIAGNOSIS — I10 Essential (primary) hypertension: Secondary | ICD-10-CM | POA: Diagnosis not present

## 2017-02-05 DIAGNOSIS — E785 Hyperlipidemia, unspecified: Secondary | ICD-10-CM | POA: Diagnosis not present

## 2017-03-07 ENCOUNTER — Ambulatory Visit (INDEPENDENT_AMBULATORY_CARE_PROVIDER_SITE_OTHER): Payer: Medicare Other | Admitting: *Deleted

## 2017-03-07 DIAGNOSIS — Z953 Presence of xenogenic heart valve: Secondary | ICD-10-CM

## 2017-03-07 DIAGNOSIS — Z7901 Long term (current) use of anticoagulants: Secondary | ICD-10-CM | POA: Diagnosis not present

## 2017-03-07 DIAGNOSIS — I4891 Unspecified atrial fibrillation: Secondary | ICD-10-CM

## 2017-03-07 DIAGNOSIS — I2699 Other pulmonary embolism without acute cor pulmonale: Secondary | ICD-10-CM

## 2017-03-07 LAB — POCT INR: INR: 3.5

## 2017-04-18 ENCOUNTER — Ambulatory Visit (INDEPENDENT_AMBULATORY_CARE_PROVIDER_SITE_OTHER): Payer: Medicare Other | Admitting: *Deleted

## 2017-04-18 DIAGNOSIS — Z953 Presence of xenogenic heart valve: Secondary | ICD-10-CM | POA: Diagnosis not present

## 2017-04-18 DIAGNOSIS — I2699 Other pulmonary embolism without acute cor pulmonale: Secondary | ICD-10-CM | POA: Diagnosis not present

## 2017-04-18 DIAGNOSIS — I4891 Unspecified atrial fibrillation: Secondary | ICD-10-CM | POA: Diagnosis not present

## 2017-04-18 DIAGNOSIS — Z7901 Long term (current) use of anticoagulants: Secondary | ICD-10-CM | POA: Diagnosis not present

## 2017-04-18 LAB — POCT INR: INR: 2.8

## 2017-04-18 NOTE — Patient Instructions (Signed)
Continue coumadin 2 tablets daily except 1 1/2 tablets on Sundays and Thursdays.  Recheck INR in 6 weeks.  

## 2017-05-25 ENCOUNTER — Other Ambulatory Visit: Payer: Self-pay | Admitting: Cardiology

## 2017-05-30 ENCOUNTER — Ambulatory Visit (INDEPENDENT_AMBULATORY_CARE_PROVIDER_SITE_OTHER): Payer: Medicare Other | Admitting: *Deleted

## 2017-05-30 DIAGNOSIS — I4891 Unspecified atrial fibrillation: Secondary | ICD-10-CM | POA: Diagnosis not present

## 2017-05-30 DIAGNOSIS — Z7901 Long term (current) use of anticoagulants: Secondary | ICD-10-CM | POA: Diagnosis not present

## 2017-05-30 DIAGNOSIS — Z953 Presence of xenogenic heart valve: Secondary | ICD-10-CM | POA: Diagnosis not present

## 2017-05-30 DIAGNOSIS — I2699 Other pulmonary embolism without acute cor pulmonale: Secondary | ICD-10-CM | POA: Diagnosis not present

## 2017-05-30 LAB — POCT INR: INR: 2.8

## 2017-05-30 NOTE — Patient Instructions (Signed)
Continue coumadin 2 tablets daily except 1 1/2 tablets on Sundays and Thursdays.  Recheck INR in 6 weeks.  

## 2017-07-11 ENCOUNTER — Ambulatory Visit (INDEPENDENT_AMBULATORY_CARE_PROVIDER_SITE_OTHER): Payer: Medicare Other | Admitting: *Deleted

## 2017-07-11 DIAGNOSIS — Z953 Presence of xenogenic heart valve: Secondary | ICD-10-CM

## 2017-07-11 DIAGNOSIS — Z7901 Long term (current) use of anticoagulants: Secondary | ICD-10-CM

## 2017-07-11 DIAGNOSIS — I4891 Unspecified atrial fibrillation: Secondary | ICD-10-CM | POA: Diagnosis not present

## 2017-07-11 DIAGNOSIS — I2699 Other pulmonary embolism without acute cor pulmonale: Secondary | ICD-10-CM | POA: Diagnosis not present

## 2017-07-11 LAB — POCT INR: INR: 3.1

## 2017-07-11 NOTE — Patient Instructions (Signed)
Take coumadin 1 tablet tonight then resume 2 tablets daily except 1 1/2 tablets on Sundays and Thursdays. Recheck INR in 6 weeks.

## 2017-08-22 ENCOUNTER — Ambulatory Visit (INDEPENDENT_AMBULATORY_CARE_PROVIDER_SITE_OTHER): Payer: Medicare Other | Admitting: *Deleted

## 2017-08-22 DIAGNOSIS — I2699 Other pulmonary embolism without acute cor pulmonale: Secondary | ICD-10-CM

## 2017-08-22 DIAGNOSIS — I4891 Unspecified atrial fibrillation: Secondary | ICD-10-CM

## 2017-08-22 DIAGNOSIS — Z7901 Long term (current) use of anticoagulants: Secondary | ICD-10-CM | POA: Diagnosis not present

## 2017-08-22 DIAGNOSIS — Z953 Presence of xenogenic heart valve: Secondary | ICD-10-CM | POA: Diagnosis not present

## 2017-08-22 LAB — POCT INR: INR: 2.7 (ref 2.0–3.0)

## 2017-08-22 MED ORDER — WARFARIN SODIUM 2 MG PO TABS: ORAL_TABLET | ORAL | 6 refills | Status: DC

## 2017-08-22 NOTE — Patient Instructions (Signed)
Continue coumadin 2 tablets daily except 1 1/2 tablets on Sundays and Thursdays.  Recheck INR in 6 weeks.  

## 2017-10-08 ENCOUNTER — Ambulatory Visit (INDEPENDENT_AMBULATORY_CARE_PROVIDER_SITE_OTHER): Payer: Medicare Other | Admitting: *Deleted

## 2017-10-08 DIAGNOSIS — Z7901 Long term (current) use of anticoagulants: Secondary | ICD-10-CM

## 2017-10-08 DIAGNOSIS — Z953 Presence of xenogenic heart valve: Secondary | ICD-10-CM

## 2017-10-08 DIAGNOSIS — I2699 Other pulmonary embolism without acute cor pulmonale: Secondary | ICD-10-CM | POA: Diagnosis not present

## 2017-10-08 DIAGNOSIS — I4891 Unspecified atrial fibrillation: Secondary | ICD-10-CM | POA: Diagnosis not present

## 2017-10-08 LAB — POCT INR: INR: 2.6 (ref 2.0–3.0)

## 2017-10-08 NOTE — Patient Instructions (Signed)
Continue coumadin 2 tablets daily except 1 1/2 tablets on Sundays and Thursdays.  Recheck INR in 6 weeks.  

## 2017-11-21 ENCOUNTER — Ambulatory Visit (INDEPENDENT_AMBULATORY_CARE_PROVIDER_SITE_OTHER): Payer: Medicare Other | Admitting: *Deleted

## 2017-11-21 DIAGNOSIS — I4891 Unspecified atrial fibrillation: Secondary | ICD-10-CM | POA: Diagnosis not present

## 2017-11-21 DIAGNOSIS — I2699 Other pulmonary embolism without acute cor pulmonale: Secondary | ICD-10-CM

## 2017-11-21 DIAGNOSIS — Z7901 Long term (current) use of anticoagulants: Secondary | ICD-10-CM | POA: Diagnosis not present

## 2017-11-21 DIAGNOSIS — Z953 Presence of xenogenic heart valve: Secondary | ICD-10-CM

## 2017-11-21 LAB — POCT INR: INR: 2.5 (ref 2.0–3.0)

## 2017-11-21 NOTE — Patient Instructions (Signed)
Continue coumadin 2 tablets daily except 1 1/2 tablets on Sundays and Thursdays.  Recheck INR in 6 weeks.  

## 2017-12-27 ENCOUNTER — Telehealth: Payer: Self-pay | Admitting: *Deleted

## 2017-12-27 DIAGNOSIS — R05 Cough: Secondary | ICD-10-CM | POA: Diagnosis not present

## 2017-12-27 NOTE — Telephone Encounter (Signed)
Pt saw Dr Willey Blade today.  Has URI.  Starting on Prednisone 20mg  x 5 days and Doxycycline 100mg  bid x 5 days.  Told pt to decrease coumadin to 3mg  daily until INR check on 01/02/18.  She verbalized understanding.

## 2018-01-02 ENCOUNTER — Ambulatory Visit (INDEPENDENT_AMBULATORY_CARE_PROVIDER_SITE_OTHER): Payer: Medicare Other | Admitting: *Deleted

## 2018-01-02 DIAGNOSIS — Z953 Presence of xenogenic heart valve: Secondary | ICD-10-CM

## 2018-01-02 DIAGNOSIS — I2699 Other pulmonary embolism without acute cor pulmonale: Secondary | ICD-10-CM

## 2018-01-02 DIAGNOSIS — Z7901 Long term (current) use of anticoagulants: Secondary | ICD-10-CM | POA: Diagnosis not present

## 2018-01-02 DIAGNOSIS — I4891 Unspecified atrial fibrillation: Secondary | ICD-10-CM | POA: Diagnosis not present

## 2018-01-02 LAB — POCT INR: INR: 3.1 — AB (ref 2.0–3.0)

## 2018-01-02 NOTE — Patient Instructions (Signed)
Hold coumadin tonight then resume 2 tablets daily except 1 1/2 tablets on Sundays and Thursdays.  Finished prednisone yesterday.  Will finish doxycycline in the morning. Recheck INR in 6 weeks.

## 2018-01-11 ENCOUNTER — Other Ambulatory Visit: Payer: Self-pay | Admitting: Cardiology

## 2018-01-11 DIAGNOSIS — E7800 Pure hypercholesterolemia, unspecified: Secondary | ICD-10-CM

## 2018-01-11 DIAGNOSIS — I480 Paroxysmal atrial fibrillation: Secondary | ICD-10-CM

## 2018-01-11 DIAGNOSIS — Z953 Presence of xenogenic heart valve: Secondary | ICD-10-CM

## 2018-01-11 DIAGNOSIS — F17200 Nicotine dependence, unspecified, uncomplicated: Secondary | ICD-10-CM

## 2018-01-14 ENCOUNTER — Telehealth: Payer: Self-pay | Admitting: Cardiology

## 2018-01-14 DIAGNOSIS — R5382 Chronic fatigue, unspecified: Secondary | ICD-10-CM | POA: Diagnosis not present

## 2018-01-14 DIAGNOSIS — R05 Cough: Secondary | ICD-10-CM | POA: Diagnosis not present

## 2018-01-14 DIAGNOSIS — Z23 Encounter for immunization: Secondary | ICD-10-CM | POA: Diagnosis not present

## 2018-01-14 DIAGNOSIS — J3481 Nasal mucositis (ulcerative): Secondary | ICD-10-CM | POA: Diagnosis not present

## 2018-01-14 NOTE — Telephone Encounter (Signed)
Patient was given new antibiotic to start

## 2018-01-14 NOTE — Telephone Encounter (Signed)
Pt was started on Levaquin 500mg  daily x 7 days.  Took first pill today.  Will finish 11/3.  Told pt to decrease coumadin dose to 3mg  on Mon,Wed,Fri,Sun and 4mg  on Tues,Thurs,Sattill finished with Abx then resume current dose.  Pt verbalized understanding.

## 2018-02-07 NOTE — Progress Notes (Signed)
HPI: FU AVR and PAF. She has a history of aortic stenosis, status post Pericardial tissue aortic valve replacement in April 2013. LHC in 06/2011 demonstrated no coronary artery disease. She was readmitted to the hospital later that month with atrial fibrillation with rapid ventricular rate as well as bilateral pulmonary emboli. She was placed on Coumadin. Patient seen by Richardson Dopp in July 2013 with episode of AFib with a heart rate of 134 during exercise at cardiac rehabilitation. There was a question of sinus of Valsalva aneurysm on previous echocardiogram and CT recommended but patient declined. Carotid Dopplers 10/17 negative.  Echocardiogram November 2018 showed normal LV systolic function, prior aortic valve replacement with mean gradient 20 mmHg and moderate right atrial enlargement.  Since I last saw her, she denies dyspnea, chest pain, palpitations or syncope.  Current Outpatient Medications  Medication Sig Dispense Refill  . albuterol (PROVENTIL HFA;VENTOLIN HFA) 108 (90 Base) MCG/ACT inhaler Inhale 2 puffs into the lungs every 4 (four) hours as needed for wheezing or shortness of breath.    Marland Kitchen amLODipine (NORVASC) 5 MG tablet Take 1 tablet (5 mg total) by mouth daily. KEEP OV. 90 tablet 0  . amoxicillin (AMOXIL) 500 MG capsule Take 4 tablets = 2 grams 30-60 minutes before dental procedures 4 capsule 6  . metoprolol succinate (TOPROL-XL) 25 MG 24 hr tablet TAKE 1 TABLET (25 MG TOTAL) BY MOUTH EVERY 12 (TWELVE) HOURS. 180 tablet 0  . pantoprazole (PROTONIX) 40 MG tablet Take 1 tablet (40 mg total) by mouth 2 (two) times daily. For reflux 180 tablet 3  . warfarin (COUMADIN) 2 MG tablet TAKE 2 TABLETS DAILY EXCEPT 1 AND 1/2 TABLET ON SUNDAYS AND THURSDAYS 60 tablet 6   No current facility-administered medications for this visit.      Past Medical History:  Diagnosis Date  . Aortic stenosis    a. echo 1/13: normal EF, severe AS, mean gradient 50 mmHg => s/p AVR 4/13;   b.Echo  07/14/11: Mod LVH, EF 55-60%, grade 2 diast dysfxn, AVR mean gradient 21 mmHg, mod to severe LAE, mild RVE, mod RAE, PASP 32   . Arthritis   . Atrial fibrillation ([place])    Post operative following AVR  . Bell's palsy 2005  . GERD (gastroesophageal reflux disease)   . HTN (hypertension), benign   . Hx of cardiac cath    [facility] 4/13:  no CAD, severe AS  . Hypercholesteremia   . Obesity (BMI 30.0-34.9) 06/29/2011  . Pulmonary embolus (H[facility])    following AVR  . S/P aortic valve replacement with bioprosthetic valve 07/05/2011   57mm Edwards Magna Ease pericardial tissue valve    Past Surgical History:  Procedure Laterality Date  . AORTIC VALVE REPLACEMENT  07/05/2011   Procedure: AORTIC VALVE REPLACEMENT (AVR);  Surgeon: Rexene Alberts, MD;  Location: [place];  Service: Open Heart Surgery;  Laterality: N/A;  . CATARACT EXTRACTION     bil  . KNEE ARTHROSCOPY  lft  . PARS PLANA VITRECTOMY W/ REPAIR OF MACULAR HOLE      Social History   Socioeconomic History  . Marital status: Married    Spouse name: Not on file  . Number of children: Not on file  . Years of education: Not on file  . Highest education level: Not on file  Occupational History  . Occupation: hospice  Social Needs  . Financial resource strain: Not on file  . Food insecurity:    Worry: Not on  file    Inability: Not on file  . Transportation needs:    Medical: Not on file    Non-medical: Not on file  Tobacco Use  . Smoking status: Former Smoker    Packs/day: 1.00    Years: 45.00    Pack years: 45.00    Types: Cigarettes    Last attempt to quit: 07/04/2011    Years since quitting: 6.6  . Smokeless tobacco: Never Used  Substance and Sexual Activity  . Alcohol use: Yes    Alcohol/week: 1.0 standard drinks    Types: 1 Standard drinks or equivalent per week    Comment: rare  . Drug use: Not on file  . Sexual activity: Yes    Birth control/protection: Post-menopausal  Lifestyle  . Physical activity:    Days per  week: Not on file    Minutes per session: Not on file  . Stress: Not on file  Relationships  . Social connections:    Talks on phone: Not on file    Gets together: Not on file    Attends religious service: Not on file    Active member of club or organization: Not on file    Attends meetings of clubs or organizations: Not on file    Relationship status: Not on file  . Intimate partner violence:    Fear of current or ex partner: Not on file    Emotionally abused: Not on file    Physically abused: Not on file    Forced sexual activity: Not on file  Other Topics Concern  . Not on file  Social History Narrative  . Not on file    Family History  Problem Relation Age of Onset  . Stroke Father   . Heart attack Mother     ROS: Occasional dizziness but no fevers or chills, productive cough, hemoptysis, dysphasia, odynophagia, melena, hematochezia, dysuria, hematuria, rash, seizure activity, orthopnea, PND, pedal edema, claudication. Remaining systems are negative.  Physical Exam: Well-developed well-nourished in no acute distress.  Skin is warm and dry.  HEENT is normal.  Neck is supple.  Chest is clear to auscultation with normal expansion.  Cardiovascular exam is regular rate and rhythm.  2/6 systolic murmur radiating to the carotids.  No diastolic murmur. Abdominal exam nontender or distended. No masses palpated. Extremities show no edema. neuro grossly intact  ECG-normal sinus rhythm at a rate of 68.  Nonspecific ST changes.  Personally reviewed  A/P  1 paroxysmal atrial fibrillation-patient remains in sinus rhythm today.  Continue beta-blocker for rate control if atrial fibrillation recurs.  Continue Coumadin with goal INR 2-3.  We discussed the possibility of apixaban.  She will check with her insurance company to see if affordable and we will change if she is agreeable.  Check hemoglobin.  2 prior aortic valve replacement-continue SBE prophylaxis.  3  hypertension-patient's blood pressure is controlled today.  Plan to continue present medications and follow.  Check potassium and renal function.  4 hyperlipidemia-patient declines statins.  Continue diet.  5 Tobacco abuse-patient counseled on discontinuing.  6 question sinus of Valsalva aneurysm on previous echocardiogram-we will arrange CTA of thoracic aorta to further assess.  Kirk Ruths, MD

## 2018-02-13 ENCOUNTER — Ambulatory Visit (INDEPENDENT_AMBULATORY_CARE_PROVIDER_SITE_OTHER): Payer: Medicare Other | Admitting: *Deleted

## 2018-02-13 DIAGNOSIS — Z7901 Long term (current) use of anticoagulants: Secondary | ICD-10-CM

## 2018-02-13 DIAGNOSIS — I2699 Other pulmonary embolism without acute cor pulmonale: Secondary | ICD-10-CM

## 2018-02-13 DIAGNOSIS — Z953 Presence of xenogenic heart valve: Secondary | ICD-10-CM

## 2018-02-13 DIAGNOSIS — I4891 Unspecified atrial fibrillation: Secondary | ICD-10-CM | POA: Diagnosis not present

## 2018-02-13 LAB — POCT INR: INR: 2.2 (ref 2.0–3.0)

## 2018-02-13 NOTE — Patient Instructions (Signed)
Continue coumadin 2 tablets daily except 1 1/2 tablets on Sundays and Thursdays.  Recheck INR in 6 weeks.  

## 2018-02-19 ENCOUNTER — Encounter: Payer: Self-pay | Admitting: Cardiology

## 2018-02-19 ENCOUNTER — Ambulatory Visit (INDEPENDENT_AMBULATORY_CARE_PROVIDER_SITE_OTHER): Payer: Medicare Other | Admitting: Cardiology

## 2018-02-19 DIAGNOSIS — F17200 Nicotine dependence, unspecified, uncomplicated: Secondary | ICD-10-CM

## 2018-02-19 DIAGNOSIS — I712 Thoracic aortic aneurysm, without rupture, unspecified: Secondary | ICD-10-CM

## 2018-02-19 DIAGNOSIS — E7800 Pure hypercholesterolemia, unspecified: Secondary | ICD-10-CM | POA: Diagnosis not present

## 2018-02-19 DIAGNOSIS — I480 Paroxysmal atrial fibrillation: Secondary | ICD-10-CM

## 2018-02-19 DIAGNOSIS — Z953 Presence of xenogenic heart valve: Secondary | ICD-10-CM | POA: Diagnosis not present

## 2018-02-19 LAB — BASIC METABOLIC PANEL
BUN / CREAT RATIO: 17 (ref 12–28)
BUN: 14 mg/dL (ref 8–27)
CALCIUM: 9.1 mg/dL (ref 8.7–10.3)
CHLORIDE: 100 mmol/L (ref 96–106)
CO2: 26 mmol/L (ref 20–29)
Creatinine, Ser: 0.81 mg/dL (ref 0.57–1.00)
GFR calc Af Amer: 86 mL/min/{1.73_m2} (ref >59)
GFR calc non Af Amer: 75 mL/min/{1.73_m2} (ref >59)
GLUCOSE: 113 mg/dL — AB (ref 65–99)
Potassium: 4.2 mmol/L (ref 3.5–5.2)
Sodium: 140 mmol/L (ref 134–144)

## 2018-02-19 LAB — CBC
HEMATOCRIT: 43.9 % (ref 34.0–46.6)
HEMOGLOBIN: 14.9 g/dL (ref 11.1–15.9)
MCH: 30.8 pg (ref 26.6–33.0)
MCHC: 33.9 g/dL (ref 31.5–35.7)
MCV: 91 fL (ref 79–97)
Platelets: 274 10*3/uL (ref 150–450)
RBC: 4.84 x10E6/uL (ref 3.77–5.28)
RDW: 13.2 % (ref 12.3–15.4)
WBC: 8.2 10*3/uL (ref 3.4–10.8)

## 2018-02-19 MED ORDER — AMLODIPINE BESYLATE 5 MG PO TABS: 5.0000 mg | ORAL_TABLET | Freq: Every day | ORAL | 0 refills | Status: DC

## 2018-02-19 MED ORDER — PANTOPRAZOLE SODIUM 40 MG PO TBEC: 40.0000 mg | DELAYED_RELEASE_TABLET | Freq: Two times a day (BID) | ORAL | 3 refills | Status: DC

## 2018-02-19 MED ORDER — METOPROLOL SUCCINATE ER 25 MG PO TB24: 25.0000 mg | ORAL_TABLET | Freq: Two times a day (BID) | ORAL | 0 refills | Status: DC

## 2018-02-19 MED ORDER — AMOXICILLIN 500 MG PO CAPS: ORAL_CAPSULE | ORAL | 6 refills | Status: DC

## 2018-02-19 NOTE — Patient Instructions (Signed)
Medication Instructions:  NO CHANGE  ELIQUIS TO REPLACE WARFARIN If you need a refill on your cardiac medications before your next appointment, please call your pharmacy.   Lab work: Your physician recommends that you HAVE LAB WORK TODAY If you have labs (blood work) drawn today and your tests are completely normal, you will receive your results only by: Marland Kitchen MyChart Message (if you have MyChart) OR . A paper copy in the mail If you have any lab test that is abnormal or we need to change your treatment, we will call you to review the results.  Testing/Procedures: CTA OF THE CHEST AT THE [address] OFFICE FOR THORACIC ANEURYSM  Follow-Up: At [facility], you and your health needs are our priority.  As part of our continuing mission to provide you with exceptional heart care, we have created designated Provider Care Teams.  These Care Teams include your primary Cardiologist (physician) and Advanced Practice Providers (APPs -  Physician Assistants and Nurse Practitioners) who all work together to provide you with the care you need, when you need it. You will need a follow up appointment in 12 months.  Please call our office 2 months in advance to schedule this appointment.  You may see Kirk Ruths MD or one of the following Advanced Practice Providers on your designated Care Team:   Kerin Ransom, PA-C Roby Lofts, [place] . Sande Rives, PA-C

## 2018-02-20 ENCOUNTER — Encounter: Payer: Self-pay | Admitting: *Deleted

## 2018-02-27 ENCOUNTER — Other Ambulatory Visit: Payer: Self-pay | Admitting: Cardiology

## 2018-02-27 DIAGNOSIS — I480 Paroxysmal atrial fibrillation: Secondary | ICD-10-CM

## 2018-02-27 DIAGNOSIS — Z953 Presence of xenogenic heart valve: Secondary | ICD-10-CM

## 2018-02-27 DIAGNOSIS — F17200 Nicotine dependence, unspecified, uncomplicated: Secondary | ICD-10-CM

## 2018-02-27 DIAGNOSIS — E7800 Pure hypercholesterolemia, unspecified: Secondary | ICD-10-CM

## 2018-02-27 NOTE — Telephone Encounter (Signed)
Rx request sent to pharmacy.  

## 2018-03-08 ENCOUNTER — Ambulatory Visit (INDEPENDENT_AMBULATORY_CARE_PROVIDER_SITE_OTHER)
Admission: RE | Admit: 2018-03-08 | Discharge: 2018-03-08 | Disposition: A | Discharge status: Home / Self Care | Payer: Medicare Other | Source: Ambulatory Visit | Attending: Cardiology | Admitting: Cardiology

## 2018-03-08 ENCOUNTER — Other Ambulatory Visit: Payer: Self-pay | Admitting: *Deleted

## 2018-03-08 ENCOUNTER — Telehealth: Payer: Self-pay | Admitting: Cardiology

## 2018-03-08 DIAGNOSIS — I712 Thoracic aortic aneurysm, without rupture, unspecified: Secondary | ICD-10-CM

## 2018-03-08 DIAGNOSIS — R933 Abnormal findings on diagnostic imaging of other parts of digestive tract: Secondary | ICD-10-CM

## 2018-03-08 MED ORDER — IOPAMIDOL (ISOVUE-370) INJECTION 76%
100.0000 mL | Freq: Once | INTRAVENOUS | Status: AC | PRN
  Administered 2018-03-08: 100 mL via INTRAVENOUS

## 2018-03-08 NOTE — Telephone Encounter (Signed)
Give report on test

## 2018-03-08 NOTE — Telephone Encounter (Signed)
RADIOLOGY WANTED DR CRENSHAW TO BE AWARE  CT REPORT IS READY FOR REVIEW FORWARD TO DR Stanford Breed

## 2018-03-11 ENCOUNTER — Other Ambulatory Visit: Payer: Self-pay | Admitting: Cardiology

## 2018-03-11 ENCOUNTER — Ambulatory Visit (HOSPITAL_COMMUNITY)
Admission: RE | Admit: 2018-03-11 | Discharge: 2018-03-11 | Disposition: A | Discharge status: Home / Self Care | Payer: Medicare Other | Source: Ambulatory Visit | Attending: Cardiology | Admitting: Cardiology

## 2018-03-11 DIAGNOSIS — K862 Cyst of pancreas: Secondary | ICD-10-CM | POA: Diagnosis not present

## 2018-03-11 DIAGNOSIS — R933 Abnormal findings on diagnostic imaging of other parts of digestive tract: Secondary | ICD-10-CM | POA: Diagnosis not present

## 2018-03-11 DIAGNOSIS — D3501 Benign neoplasm of right adrenal gland: Secondary | ICD-10-CM | POA: Insufficient documentation

## 2018-03-11 DIAGNOSIS — D3502 Benign neoplasm of left adrenal gland: Secondary | ICD-10-CM | POA: Insufficient documentation

## 2018-03-11 DIAGNOSIS — D3500 Benign neoplasm of unspecified adrenal gland: Secondary | ICD-10-CM | POA: Diagnosis not present

## 2018-03-14 ENCOUNTER — Ambulatory Visit (HOSPITAL_COMMUNITY)
Admission: RE | Admit: 2018-03-14 | Discharge: 2018-03-14 | Disposition: A | Discharge status: Home / Self Care | Payer: Medicare Other | Source: Ambulatory Visit | Attending: Cardiology | Admitting: Cardiology

## 2018-03-14 ENCOUNTER — Telehealth: Payer: Self-pay | Admitting: Cardiology

## 2018-03-14 DIAGNOSIS — R933 Abnormal findings on diagnostic imaging of other parts of digestive tract: Secondary | ICD-10-CM | POA: Diagnosis not present

## 2018-03-14 DIAGNOSIS — I350 Nonrheumatic aortic (valve) stenosis: Secondary | ICD-10-CM | POA: Diagnosis not present

## 2018-03-14 MED ORDER — IOPAMIDOL (ISOVUE-370) INJECTION 76%
100.0000 mL | Freq: Once | INTRAVENOUS | Status: AC | PRN
  Administered 2018-03-14: 100 mL via INTRAVENOUS

## 2018-03-14 NOTE — Telephone Encounter (Signed)
Follow up:    Patient returning call concerning some result. Patient states she is home.

## 2018-03-14 NOTE — Telephone Encounter (Signed)
Spoke with pt, aware of MRI results. 

## 2018-03-25 ENCOUNTER — Telehealth: Payer: Self-pay | Admitting: Cardiology

## 2018-03-25 DIAGNOSIS — R931 Abnormal findings on diagnostic imaging of heart and coronary circulation: Secondary | ICD-10-CM

## 2018-03-25 NOTE — Telephone Encounter (Signed)
Follow Up:      Pt calling to see if her CT results are ready from 03-14-18 please.

## 2018-03-25 NOTE — Telephone Encounter (Signed)
Notified patient that CT results are not available. Routed to MD

## 2018-03-25 NOTE — Telephone Encounter (Signed)
Terri Siren, Do you mind reading this and finalizing? Thanks [facility]

## 2018-03-27 ENCOUNTER — Encounter: Payer: Self-pay | Admitting: Cardiology

## 2018-03-27 ENCOUNTER — Ambulatory Visit (INDEPENDENT_AMBULATORY_CARE_PROVIDER_SITE_OTHER): Payer: Medicare Other | Admitting: Pharmacist

## 2018-03-27 ENCOUNTER — Telehealth: Payer: Self-pay | Admitting: Cardiology

## 2018-03-27 DIAGNOSIS — Z953 Presence of xenogenic heart valve: Secondary | ICD-10-CM

## 2018-03-27 DIAGNOSIS — Z7901 Long term (current) use of anticoagulants: Secondary | ICD-10-CM

## 2018-03-27 DIAGNOSIS — I2699 Other pulmonary embolism without acute cor pulmonale: Secondary | ICD-10-CM

## 2018-03-27 DIAGNOSIS — I4891 Unspecified atrial fibrillation: Secondary | ICD-10-CM | POA: Diagnosis not present

## 2018-03-27 LAB — POCT INR: INR: 2.4 (ref 2.0–3.0)

## 2018-03-27 MED ORDER — WARFARIN SODIUM 2 MG PO TABS: ORAL_TABLET | ORAL | 6 refills | Status: DC

## 2018-03-27 NOTE — Patient Instructions (Signed)
Description   Continue coumadin 2 tablets daily except 1 1/2 tablets on Sundays and Thursdays.  Recheck INR in 6 weeks.

## 2018-03-27 NOTE — Progress Notes (Signed)
I have reviewed the patient's CT scan personally.  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  I have discussed this with she and her husband by phone.  I have recommended surgical consultation for repair.  She is hesitant but is agreeable.  We will arrange evaluation with Dr. Roxy Manns.  She understands that if this ruptures it could be life-threatening.  She still states that she may not be agreeable to proceed as she had difficulties at time of last surgery.  We will contact patient with surgical appointment. Kirk Ruths, MD

## 2018-03-27 NOTE — Telephone Encounter (Signed)
Spoke with pt, aware results are not ready yet. Will forward for dr Stanford Breed review

## 2018-03-27 NOTE — Telephone Encounter (Signed)
New Message   Patient calling for CT results from 03/14/18.

## 2018-04-15 ENCOUNTER — Other Ambulatory Visit: Payer: Self-pay

## 2018-04-15 ENCOUNTER — Encounter: Payer: Self-pay | Admitting: Thoracic Surgery (Cardiothoracic Vascular Surgery)

## 2018-04-15 ENCOUNTER — Institutional Professional Consult (permissible substitution) (INDEPENDENT_AMBULATORY_CARE_PROVIDER_SITE_OTHER): Payer: Medicare Other | Admitting: Thoracic Surgery (Cardiothoracic Vascular Surgery)

## 2018-04-15 VITALS — BP 134/72 | HR 67 | Resp 16 | Ht 62.0 in | Wt 159.0 lb

## 2018-04-15 DIAGNOSIS — Z7901 Long term (current) use of anticoagulants: Secondary | ICD-10-CM | POA: Diagnosis not present

## 2018-04-15 DIAGNOSIS — I253 Aneurysm of heart: Secondary | ICD-10-CM

## 2018-04-15 DIAGNOSIS — Z953 Presence of xenogenic heart valve: Secondary | ICD-10-CM

## 2018-04-15 DIAGNOSIS — I729 Aneurysm of unspecified site: Secondary | ICD-10-CM

## 2018-04-15 NOTE — Progress Notes (Signed)
[address]       [place] [address]             [phone]     CARDIOTHORACIC SURGERY CONSULTATION REPORT  Referring Provider is Terri Edwards, Terri Bors, MD PCP is Terri Noble, MD  Chief Complaint  Patient presents with  . 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  Patient's cardiac history dates back to 2013 when she underwent elective aortic valve replacement using a 21 mm Edwards magna ease stented bovine pericardial tissue valve for bicuspid aortic valve with severe symptomatic aortic stenosis.  The patient's surgical procedure and postoperative recovery was initially uneventful.  She was briefly readmitted approximately 1 month postoperatively with atrial fibrillation and a small pulmonary embolism.  She was anticoagulated using warfarin and has remained on warfarin ever since.  She has been seen regularly on an annual basis by Dr. Stanford Edwards, and most recent transthoracic echocardiogram performed February 05, 2017 revealed normal left ventricular systolic function with normal functioning bioprosthetic tissue valve in the aortic position.  Peak velocity across the aortic valve and mean transvalvular gradient reported 3.3 m/s and 20 mmHg, respectively, unchanged from echocardiograms performed immediately following her surgery in 2013.  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  This revealed a 2.3 x 3.1 x 2.1 cm "extension of the left ventricular cavity" that in retrospect was felt to be present but smaller on the CT angiogram of the chest performed July 17, 2011 when the patient was diagnosed with acute pulmonary embolism.  In follow-up a cardiac gated CT angiogram of the heart was performed March 14, 2018, confirming the presence of what appeared to be a pseudoaneurysm involving the left ventricular outflow tract just beneath the prosthetic valve along the noncoronary sinus of Valsalva.  Cardiothoracic surgical consultation was requested.  The patient is retired and lives locally with her husband who is also a previous patient of mine.  They remain reasonably active physically and have been enjoying retirement.  The patient states that up until recently she was exercising at least 2 or 3 days a week at her local [employer].  She does admit to stable symptoms of exertional shortness of breath that occur only with more strenuous physical exertion.  The symptoms have been unchanged since her surgery 8 years ago.  She has no exertional chest pain or chest tightness.  Her exercise tolerance has not changed recently.  She has never had any resting shortness of breath, PND, orthopnea, or lower extremity edema.  She has never had any significant febrile illnesses nor hospitalization for occult infection.  Overall she reports that she feels fine.  Past Medical History:  Diagnosis Date  . Aortic stenosis    a. echo 1/13: normal EF, severe AS, mean gradient 50 mmHg => s/p AVR 4/13;   b.Echo 07/14/11: Mod LVH, EF 55-60%, grade 2 diast dysfxn, AVR mean gradient 21 mmHg, mod to severe LAE, mild RVE,  mod RAE, PASP 32   . Arthritis   . Atrial fibrillation ([facility])    Post operative following AVR  . Bell's palsy 2005  . GERD (gastroesophageal reflux disease)   . HTN (hypertension), benign   . Hx of cardiac cath    [facility] 4/13:   no CAD, severe AS  . Hypercholesteremia   . Obesity (BMI 30.0-34.9) 06/29/2011  . Pulmonary embolus ([facility])    following AVR  . S/P aortic valve replacement with bioprosthetic valve 07/05/2011   72mm Edwards Magna Ease pericardial tissue valve    Past Surgical History:  Procedure Laterality Date  . AORTIC VALVE REPLACEMENT  07/05/2011   Procedure: AORTIC VALVE REPLACEMENT (AVR);  Surgeon: Terri Alberts, MD;  Location: [place];  Service: Open Heart Surgery;  Laterality: N/A;  . CATARACT EXTRACTION     bil  . KNEE ARTHROSCOPY  lft  . PARS PLANA VITRECTOMY W/ REPAIR OF MACULAR HOLE      Family History  Problem Relation Age of Onset  . Stroke Father   . Heart attack Mother     Social History   Socioeconomic History  . Marital status: Married    Spouse name: Not on file  . Number of children: Not on file  . Years of education: Not on file  . Highest education level: Not on file  Occupational History  . Occupation: hospice  Social Needs  . Financial resource strain: Not on file  . Food insecurity:    Worry: Not on file    Inability: Not on file  . Transportation needs:    Medical: Not on file    Non-medical: Not on file  Tobacco Use  . Smoking status: Former Smoker    Packs/day: 1.00    Years: 45.00    Pack years: 45.00    Types: Cigarettes    Last attempt to quit: 07/04/2011    Years since quitting: 6.7  . Smokeless tobacco: Never Used  Substance and Sexual Activity  . Alcohol use: Yes    Alcohol/week: 1.0 standard drinks    Types: 1 Standard drinks or equivalent per week    Comment: rare  . Drug use: Not on file  . Sexual activity: Yes    Birth control/protection: Post-menopausal  Lifestyle  . Physical activity:    Days per week: Not on file    Minutes per session: Not on file  . Stress: Not on file  Relationships  . Social connections:    Talks on phone: Not on file    Gets together: Not on file    Attends religious service: Not on file    Active member of  club or organization: Not on file    Attends meetings of clubs or organizations: Not on file    Relationship status: Not on file  . Intimate partner violence:    Fear of current or ex partner: Not on file    Emotionally abused: Not on file    Physically abused: Not on file    Forced sexual activity: Not on file  Other Topics Concern  . Not on file  Social History Narrative  . Not on file    Current Outpatient Medications  Medication Sig Dispense Refill  . albuterol (PROVENTIL HFA;VENTOLIN HFA) 108 (90 Base) MCG/ACT inhaler Inhale 2 puffs into the lungs every 4 (four) hours as needed for wheezing or shortness of breath.    Marland Kitchen amLODipine (NORVASC) 5 MG tablet Take 1 tablet (5 mg total) by mouth  daily. KEEP OV. 90 tablet 0  . amoxicillin (AMOXIL) 500 MG capsule Take 4 tablets = 2 grams 30-60 minutes before dental procedures 4 capsule 6  . metoprolol succinate (TOPROL-XL) 25 MG 24 hr tablet TAKE 1 TABLET (25 MG TOTAL) BY MOUTH EVERY 12 (TWELVE) HOURS. 180 tablet 0  . pantoprazole (PROTONIX) 40 MG tablet Take 1 tablet (40 mg total) by mouth 2 (two) times daily. For reflux (Patient taking differently: Take 40 mg by mouth 2 (two) times daily as needed. For reflux) 180 tablet 3  . warfarin (COUMADIN) 2 MG tablet TAKE 2 TABLETS DAILY EXCEPT 1 AND 1/2 TABLET ON SUNDAYS AND THURSDAYS 60 tablet 6   No current facility-administered medications for this visit.     Allergies  Allergen Reactions  . Nickel     Rash blisters  . Sulfa Antibiotics     Blisters  Rash   . Tape     Slight skin irritation      Review of Systems:   General:  normal appetite, normal energy, no weight gain, no weight loss, no fever  Cardiac:  no chest pain with exertion, no chest pain at rest, +SOB with strenuous exertion, no resting SOB, no PND, no orthopnea, no palpitations, no arrhythmia, no atrial fibrillation, no LE edema, no dizzy spells, no syncope  Respiratory:  no shortness of breath, no home oxygen, no  productive cough, no dry cough, occasional bronchitis, no wheezing, no hemoptysis, no asthma, no pain with inspiration or cough, no sleep apnea, no CPAP at night  GI:   no difficulty swallowing, + reflux, no frequent heartburn, no hiatal hernia, no abdominal pain, no constipation, no diarrhea, no hematochezia, no hematemesis, no melena  GU:   no dysuria,  no frequency, no urinary tract infection, no hematuria, no  kidney stones, no kidney disease  Vascular:  no pain suggestive of claudication, no pain in feet, no leg cramps, no varicose veins, no DVT, no non-healing foot ulcer  Neuro:   no stroke, no TIA's, no seizures, no headaches, no temporary blindness one eye,  no slurred speech, no peripheral neuropathy, no chronic pain, no instability of gait, no memory/cognitive dysfunction  Musculoskeletal: + arthritis, no joint swelling, no myalgias, no difficulty walking, no mobility   Skin:   no rash, no itching, no skin infections, no pressure sores or ulcerations  Psych:   no anxiety, no depression, no nervousness, no unusual recent stress  Eyes:   no blurry vision, no floaters, no recent vision changes, does not wears glasses or contacts  ENT:   no hearing loss, no loose or painful teeth, no dentures, last saw dentist October 2019  Hematologic:  + easy bruising, no abnormal bleeding, no clotting disorder, no frequent epistaxis  Endocrine:  no diabetes, does not check CBG's at home     Physical Exam:   BP 134/72 (BP Location: Left Arm, Patient Position: Sitting, Cuff Size: Normal)   Pulse 67   Resp 16   Ht 5\' 2"  (1.575 m)   Wt 159 lb (72.1 kg)   LMP 04/29/1991   SpO2 96% Comment: ON RA  BMI 29.08 kg/m   General:  Mildly obese,  well-appearing  HEENT:  Unremarkable   Neck:   no JVD, no bruits, no adenopathy   Chest:   clear to auscultation, symmetrical breath sounds, no wheezes, no rhonchi   CV:   RRR, grade I-II/VI systolic murmur   Abdomen:  soft, non-tender, no masses    Extremities:  warm,  well-perfused, pulses diminished but palpable, no LE edema  Rectal/GU  Deferred  Neuro:   Grossly non-focal and symmetrical throughout  Skin:   Clean and dry, no rashes, no breakdown   Diagnostic Tests:  Transthoracic Echocardiography  Patient:    Terri Edwards, Terri Edwards MR #:       [id number] Study Date: 02/05/2017 Gender:     F Age:        45 Height:     157.5 cm Weight:     71.2 kg BSA:        1.79 m^2 Pt. Status: Room:   Patrina Levering  REFERRING    Kirk Ruths  ATTENDING    Candee Furbish, M.D.  SONOGRAPHER  Marygrace Drought, RCS  PERFORMING   [facility], Outpatient  cc:  ------------------------------------------------------------------- LV EF: 55% -   60%  ------------------------------------------------------------------- Indications:      S/p AVR (Z95.2).  ------------------------------------------------------------------- History:   PMH:  Pulmonary Emboli, hyperlipidemia.  Atrial fibrillation.  Risk factors:  Current tobacco use. Hypertension.   ------------------------------------------------------------------- Study Conclusions  - Left ventricle: The cavity size was normal. Wall thickness was   increased in a pattern of mild LVH. Systolic function was normal.   The estimated ejection fraction was in the range of 55% to 60%.   Wall motion was normal; there were no regional wall motion   abnormalities. - Aortic valve: Mildly thickened, mildly calcified leaflets. A   bioprosthesis was present. There was moderate stenosis. Peak   velocity (S): 327 cm/s. Mean gradient (S): 20 mm Hg. Valve area   (VTI): 0.92 cm^2. Valve area (Vmax): 0.81 cm^2. Valve area   (Vmean): 0.88 cm^2. - Mitral valve: Calcified annulus. Mildly thickened leaflets . - Right atrium: The atrium was moderately dilated.  Impressions:  - Mean gradient is stable but continues to be  elevated.  ------------------------------------------------------------------- Study data:  Comparison was made to the study of 04/21/2015.  Study status:  Routine.  Procedure:  The patient reported no pain pre or post test. Transthoracic echocardiography. Image quality was adequate.          Transthoracic echocardiography.  M-mode, complete 2D, spectral Doppler, and color Doppler.  Birthdate: Patient birthdate: 12/15/1950.  Age:  Patient is 68 yr old.  Sex: Gender: female.    BMI: 28.7 kg/m^2.  Blood pressure:     141/70 Patient status:  Outpatient.  Study date:  Study date: 02/05/2017. Study time: 10:43 AM.  Location:  [facility]  -------------------------------------------------------------------  ------------------------------------------------------------------- Left ventricle:  The cavity size was normal. Wall thickness was increased in a pattern of mild LVH. Systolic function was normal. The estimated ejection fraction was in the range of 55% to 60%. Wall motion was normal; there were no regional wall motion abnormalities. There was no evidence of elevated ventricular filling pressure by Doppler parameters.  ------------------------------------------------------------------- Aortic valve:   Mildly thickened, mildly calcified leaflets. A bioprosthesis was present. Mobility was not restricted.  Doppler: There was moderate stenosis.   There was no regurgitation.    VTI ratio of LVOT to aortic valve: 0.32. Valve area (VTI): 0.92 cm^2. Indexed valve area (VTI): 0.51 cm^2/m^2. Peak velocity ratio of LVOT to aortic valve: 0.28. Valve area (Vmax): 0.81 cm^2. Indexed valve area (Vmax): 0.45 cm^2/m^2. Mean velocity ratio of LVOT to aortic valve: 0.31. Valve area (Vmean): 0.88 cm^2. Indexed valve area (Vmean): 0.49 cm^2/m^2.    Mean gradient (S): 20 mm Hg. Peak gradient (S): 43 mm Hg.  -------------------------------------------------------------------  Aorta:  Aortic root:  The aortic root was normal in size.  ------------------------------------------------------------------- Mitral valve:   Calcified annulus. Mildly thickened leaflets . Mobility was not restricted.  Doppler:  Transvalvular velocity was within the normal range. There was no evidence for stenosis. There was trivial regurgitation.    Valve area by pressure half-time: 2.82 cm^2. Indexed valve area by pressure half-time: 1.58 cm^2/m^2.    Peak gradient (D): 3 mm Hg.  ------------------------------------------------------------------- Left atrium:  The atrium was normal in size.  ------------------------------------------------------------------- Right ventricle:  The cavity size was normal. Wall thickness was normal. Systolic function was normal.  ------------------------------------------------------------------- Pulmonic valve:    Structurally normal valve.   Cusp separation was normal.  Doppler:  Transvalvular velocity was within the normal range. There was no evidence for stenosis. There was trivial regurgitation.  ------------------------------------------------------------------- Tricuspid valve:   Structurally normal valve.    Doppler: Transvalvular velocity was within the normal range. There was trivial regurgitation.  ------------------------------------------------------------------- Pulmonary artery:   The main pulmonary artery was normal-sized. Systolic pressure was within the normal range.  ------------------------------------------------------------------- Right atrium:  The atrium was moderately dilated.  ------------------------------------------------------------------- Pericardium:  There was no pericardial effusion.  ------------------------------------------------------------------- Systemic veins: Inferior vena cava: The vessel was normal in size. The respirophasic diameter changes were in the normal range (= 50%), consistent with normal central  venous pressure. Diameter: 12 mm.  ------------------------------------------------------------------- Measurements   IVC                                      Value          Reference  ID                                       12    mm       ----------    Left ventricle                           Value          Reference  LV ID, ED, PLAX chordal                  47    mm       43 - 52  LV ID, ES, PLAX chordal                  32    mm       23 - 38  LV fx shortening, PLAX chordal           32    %        >=29  LV PW thickness, ED                      13    mm       ----------  IVS/LV PW ratio, ED                      0.77           <=1.3  Stroke volume, 2D                        69    ml       ----------  Stroke volume/bsa, 2D                    39    ml/m^2   ----------  LV e&', lateral                           7.4   cm/s     ----------  LV E/e&', lateral                         11.61          ----------  LV e&', medial                            10.2  cm/s     ----------  LV E/e&', medial                          8.42           ----------  LV e&', average                           8.8   cm/s     ----------  LV E/e&', average                         9.76           ----------    Ventricular septum                       Value          Reference  IVS thickness, ED                        10    mm       ----------    LVOT                                     Value          Reference  LVOT ID, S                               19    mm       ----------  LVOT area                                2.84  cm^2     ----------  LVOT peak velocity, S                    92.8  cm/s     ----------  LVOT mean velocity, S                    63.9  cm/s     ----------  LVOT VTI, S                              24.3  cm       ----------    Aortic valve  Value          Reference  Aortic valve peak velocity, S            327   cm/s     ----------  Aortic valve mean velocity, S             206   cm/s     ----------  Aortic valve VTI, S                      75.3  cm       ----------  Aortic mean gradient, S                  20    mm Hg    ----------  Aortic peak gradient, S                  43    mm Hg    ----------  VTI ratio, LVOT/AV                       0.32           ----------  Aortic valve area, VTI                   0.92  cm^2     ----------  Aortic valve area/bsa, VTI               0.51  cm^2/m^2 ----------  Velocity ratio, peak, LVOT/AV            0.28           ----------  Aortic valve area, peak velocity         0.81  cm^2     ----------  Aortic valve area/bsa, peak              0.45  cm^2/m^2 ----------  velocity  Velocity ratio, mean, LVOT/AV            0.31           ----------  Aortic valve area, mean velocity         0.88  cm^2     ----------  Aortic valve area/bsa, mean              0.49  cm^2/m^2 ----------  velocity    Aorta                                    Value          Reference  Aortic root ID, ED                       34    mm       ----------    Left atrium                              Value          Reference  LA ID, A-P, ES                           40    mm       ----------  LA ID/bsa, A-P                   (  H)     2.24  cm/m^2   <=2.2  LA volume, S                             61.9  ml       ----------  LA volume/bsa, S                         34.6  ml/m^2   ----------  LA volume, ES, 1-p A4C                   50.2  ml       ----------  LA volume/bsa, ES, 1-p A4C               28.1  ml/m^2   ----------  LA volume, ES, 1-p A2C                   65.2  ml       ----------  LA volume/bsa, ES, 1-p A2C               36.5  ml/m^2   ----------    Mitral valve                             Value          Reference  Mitral E-wave peak velocity              85.9  cm/s     ----------  Mitral A-wave peak velocity              82.9  cm/s     ----------  Mitral deceleration time         (H)     268   ms       150 - 230  Mitral pressure half-time                 78    ms       ----------  Mitral peak gradient, D                  3     mm Hg    ----------  Mitral E/A ratio, peak                   1              ----------  Mitral valve area, PHT, DP               2.82  cm^2     ----------  Mitral valve area/bsa, PHT, DP           1.58  cm^2/m^2 ----------    Pulmonary arteries                       Value          Reference  PA pressure, S, DP                       28    mm Hg    <=30    Tricuspid valve                          Value          Reference  Tricuspid regurg peak velocity           251   cm/s     ----------  Tricuspid peak RV-RA gradient            25    mm Hg    ----------  Tricuspid maximal regurg                 251   cm/s     ----------  velocity, PISA    Right atrium                             Value          Reference  RA ID, S-I, ES, A4C              (H)     56    mm       34 - 49  RA area, ES, A4C                 (H)     20.1  cm^2     8.3 - 19.5  RA volume, ES, A/L                       60.7  ml       ----------  RA volume/bsa, ES, A/L                   34    ml/m^2   ----------    Systemic veins                           Value          Reference  Estimated CVP                            3     mm Hg    ----------    Right ventricle                          Value          Reference  TAPSE                                    20.8  mm       ----------  RV pressure, S, DP                       28    mm Hg    <=30  RV s&', lateral, S                        12.4  cm/s     ----------  Legend: (L)  and  (H)  mark values outside specified reference range.  ------------------------------------------------------------------- Prepared and Electronically Authenticated by  Candee Furbish, M.D. 2018-11-19T14:23:57   CT ANGIOGRAPHY CHEST WITH CONTRAST  TECHNIQUE: Multidetector CT imaging of the chest was performed using the standard protocol during bolus administration of intravenous contrast. Multiplanar CT image  reconstructions and MIPs were obtained to evaluate the vascular anatomy.  CONTRAST:  183mL ISOVUE-370 IOPAMIDOL (ISOVUE-370) INJECTION 76%  COMPARISON:  04/21/2011 and 07/17/2011  FINDINGS: Cardiovascular: Maximal diameter of the  ascending aorta at the sinus of Valsalva, sino-tubular junction, and ascending aorta are 2.9 cm, 2.7 cm, and 3.1 cm respectively.  The patient is now status post aortic valve replacement. The valve hardware is appropriately positioned within the location of the aortic valve. There is continuation of the lumen of the left ventricle into a small cavity anterior to the aortic valve on image 51 of series 4, which measures 2.3 x 3.1 x 2.1 cm. It fills with contrast and was not visualized on the preoperative study. It was present on the postop chest CT dated 07/16/2001 and the area has become larger. It is uncertain as to whether this is an expected anatomical variation after aortic valve replacement or if this represents a small pseudoaneurysm.  At the upper ascending aorta, just anterior to the aortic lumen, there is a 0.4 x 0.8 cm hyperdense area in continuity with the aorta. See image 32. There is a similar area along the proximal aortic arch measuring 0.8 x 0.4 cm on image 26 of series 4. Compared with postop CT scan imaging, these areas are similar in appearance but are less dense today. They are likely related to aortotomy suture material.  Scattered aortic calcifications are seen throughout the thorax. There is no dissection. There is no obvious acute intramural hematoma. Mild LAD territory coronary artery calcifications.  Mediastinum/Nodes: No abnormal mediastinal adenopathy. There is mild wall thickening of the distal esophagus. Visualized thyroid is unremarkable.  Lungs/Pleura: No pneumothorax or pleural effusion. There is pleuroparenchymal scarring at the lung apices. Otherwise clear lungs.  Upper Abdomen: Stable large left adrenal  nodule. Stability over several years supports benign etiology. Prominence of the right adrenal gland is also stable without focal mass. There are 2 new hypodensities in the tail of the pancreas. The larger measures 6 mm see images 90 and 91 of series 4.  Musculoskeletal: There is no vertebral compression deformity  Review of the MIP images confirms the above findings.  IMPRESSION: Compared to prior imaging performed just after the aortic valve replacement, there is an extension of the left ventricle cavity anterior to the aortic valve replacement hardware that has enlarged compared to the prior study. Today, it measures 2.3 x 3.1 x 2.1 cm. It is difficult to determine if this is an expected anatomical variation of the left ventricle cavity or this represents a pseudoaneurysm adjacent to the aortic valve hardware. Otherwise, expected postoperative changes are present and there is no evidence of dissection. There is no evidence of aneurysm in the ascending aorta.  There are 2 new small hypodensities in the pancreas measuring 0.6 mm or less in size. These are incompletely evaluated by this study. Pancreatic MRI is recommended with further recommendations.  Aortic Atherosclerosis (ICD10-I70.0).   Electronically Signed   By: Marybelle Killings M.D.   On: 03/08/2018 11:28   Cardiac TAVR CT  TECHNIQUE: The patient was scanned on a [employer]. A 120 kV retrospective scan was triggered in the descending thoracic aorta at 111 HU's. Gantry rotation speed was 250 msecs and collimation was .6 mm. No beta blockade or nitro were given. The 3D data set was reconstructed in 5% intervals of the R-R cycle. Systolic and diastolic phases were analyzed on a dedicated work station using MPR, MIP and VRT modes. The patient received 80 cc of contrast.  MEDICATIONS: None.  FINDINGS: Aortic Valve: S/p AVR with a 21 mm [facility] Ease pericardial valve. Leaflets appear to  have normal thickness, trivial calcifications and normal excursions.  Normal size of the sinuses of Valsalva. No aneurysm.  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 The neck measures 15.6 x 12.6 mm and it continues anteriorly with dimensions 33 (antero-posterior) x 25 (supero-inferior) x 23 (right to left) mm. There is a blood flow into the pseudoaneurysm with mild enlargement during systole. The tip of the cavity is partially thrombosed (18 x 10 mm) and there are also calcifications of the pseudoaneurysm wall suggestive of a chronic process.  Aorta: Normal size with moderate diffuse atherosclerotic plaque with calcifications. No dissection.  Sinotubular Junction: 28 x 27 mm  Ascending Thoracic Aorta: 31 x 29 mm  Aortic Arch: 24 x 23 mm  Descending Thoracic Aorta: 25 x 24 mm  Sinus of Valsalva Measurements:  Non-coronary: 26 mm  Right -coronary: 24 mm  Left -coronary: 24 mm  Prosthetic Aortic Valve Ring Measurements:  Maximum/Minimum Diameter: 19.6  X 18.3 mm  Mean Diameter: 18.6 mm  Area: 272 mm2  No thrombus in the left atrial appendage.  Mildly dilated pulmonary artery measuring 32 mm.  IMPRESSION: 1. S/p AVR with a 21 mm [facility] Ease pericardial valve. Leaflets appear to have normal thickness, trivial calcifications, normal excursions. Sinuses of Valsalva have normal size with no evidence for aneurysm.  2. 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 The neck measures 15.6 x 12.6 mm and it continues anteriorly with dimensions 33 x 25 x 23 mm. There is a blood flow into the pseudoaneurysm with mild enlargement during systole. The tip of the cavity is partially thrombosed (18 x 10 mm) and there are also calcifications of the pseudoaneurysm  wall suggestive of a chronic process. When compared to the prior non-gated chest CT from 07/17/2011 the cavity has doubled the size since then.  CT surgery consult and anticoagulation is recommended.   Electronically Signed   By: Ena Dawley   On: 03/27/2018 08:07    Impression:  I have personally reviewed the patient's recent CT angiogram of the chest and cardiac gated CT angiogram of the heart.  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  This false aneurysm has radiographic characteristics suggestive of chronicity, and CT angiogram of the chest performed after her surgery in April 2013 does reveal of much smaller defect in the same location.  Over the past 8 years this false aneurysm has clearly enlarged, although the time course remains unclear.  False aneurysm measures close to 3 cm in the longitudinal direction.  Clinically the patient remains asymptomatic and most recent follow-up echocardiogram performed more than 1 year ago suggest that her bioprosthetic tissue valve continues to function normally with no sign of paravalvular or intra-valvular leak.  Overall I favor surgical repair.  Alternatively the patient could be followed closely with serial cardiac gated CT angiograms to assess stability.  Because of the location and size of this false aneurysm I remain concerned that over the remainder of the patient's life she will be at significant risk of further enlargement of this false aneurysm.  Although the risks of rupture are probably relatively low, there are certainly not entirely insignificant.  At the patient's current age with normal left ventricular function risks associated with redo sternotomy for  repair of this false aneurysm and redo aortic valve or aortic root replacement should be acceptably low.   Plan:  I have  discussed the likely etiology and nature of the patient's false aneurysm at length with the patient and her husband in the office today.  We directly reviewed images from her recent cardiac gated CT angiogram of the heart.  We discussed treatment options including surgical repair versus continued observation with close follow-up.  I explained that my bias was that it would be best to consider elective repair while the patient remains reasonably healthy and risks associated with surgery acceptably low.  All of their questions have been addressed.  The patient does not wish to consider elective surgery at this time and specifically requests that we bring her back for follow-up CT angiogram in approximately 6 months.  Under the circumstances this seems acceptable.  She understands that if follow-up CT angiogram demonstrates interval increase in size of this false aneurysm then my recommendation for elective surgery will be clear.   I spent in excess of 90 minutes during the conduct of this office consultation and >50% of this time involved direct face-to-face encounter with the patient for counseling and/or coordination of their care.   Valentina Gu. Roxy Manns, MD 04/15/2018 12:28 PM

## 2018-04-15 NOTE — Patient Instructions (Signed)
Continue all previous medications without any changes at this time  

## 2018-05-08 ENCOUNTER — Ambulatory Visit (INDEPENDENT_AMBULATORY_CARE_PROVIDER_SITE_OTHER): Payer: Medicare Other | Admitting: *Deleted

## 2018-05-08 DIAGNOSIS — Z953 Presence of xenogenic heart valve: Secondary | ICD-10-CM | POA: Diagnosis not present

## 2018-05-08 DIAGNOSIS — Z7901 Long term (current) use of anticoagulants: Secondary | ICD-10-CM

## 2018-05-08 DIAGNOSIS — I2699 Other pulmonary embolism without acute cor pulmonale: Secondary | ICD-10-CM

## 2018-05-08 DIAGNOSIS — I4891 Unspecified atrial fibrillation: Secondary | ICD-10-CM | POA: Diagnosis not present

## 2018-05-08 LAB — POCT INR: INR: 2.9 (ref 2.0–3.0)

## 2018-05-08 NOTE — Patient Instructions (Signed)
Continue coumadin 2 tablets daily except 1 1/2 tablets on Sundays and Thursdays.  Recheck INR in 6 weeks.  

## 2018-05-22 ENCOUNTER — Other Ambulatory Visit: Payer: Self-pay | Admitting: Cardiology

## 2018-05-22 DIAGNOSIS — Z953 Presence of xenogenic heart valve: Secondary | ICD-10-CM

## 2018-05-22 DIAGNOSIS — F17200 Nicotine dependence, unspecified, uncomplicated: Secondary | ICD-10-CM

## 2018-05-22 DIAGNOSIS — E7800 Pure hypercholesterolemia, unspecified: Secondary | ICD-10-CM

## 2018-05-22 DIAGNOSIS — I480 Paroxysmal atrial fibrillation: Secondary | ICD-10-CM

## 2018-05-28 ENCOUNTER — Other Ambulatory Visit: Payer: Self-pay | Admitting: Cardiology

## 2018-05-28 DIAGNOSIS — Z953 Presence of xenogenic heart valve: Secondary | ICD-10-CM

## 2018-05-28 DIAGNOSIS — E7800 Pure hypercholesterolemia, unspecified: Secondary | ICD-10-CM

## 2018-05-28 DIAGNOSIS — F17200 Nicotine dependence, unspecified, uncomplicated: Secondary | ICD-10-CM

## 2018-05-28 DIAGNOSIS — I480 Paroxysmal atrial fibrillation: Secondary | ICD-10-CM

## 2018-06-18 ENCOUNTER — Telehealth: Payer: Self-pay | Admitting: Cardiology

## 2018-06-18 ENCOUNTER — Telehealth: Payer: Self-pay | Admitting: *Deleted

## 2018-06-18 NOTE — Telephone Encounter (Signed)

## 2018-06-18 NOTE — Telephone Encounter (Signed)
Left message for patient. Need to pre-register/screen for [facility]  Appointment  June 19, 2018.    COVID-19 Pre-Screening Questions:   Do you currently have a fever?     Have you recently travelled on a cruise, internationally, or to [place], [place], [place], [place], [place], or [place], [place] [employer]) ?    Have you been in contact with someone that is currently pending confirmation of Covid19 testing or has been confirmed to have the [place] virus?  Are you currently experiencing fatigue or cough?

## 2018-06-19 ENCOUNTER — Ambulatory Visit (INDEPENDENT_AMBULATORY_CARE_PROVIDER_SITE_OTHER): Payer: Medicare Other | Admitting: *Deleted

## 2018-06-19 DIAGNOSIS — Z953 Presence of xenogenic heart valve: Secondary | ICD-10-CM

## 2018-06-19 DIAGNOSIS — I2699 Other pulmonary embolism without acute cor pulmonale: Secondary | ICD-10-CM

## 2018-06-19 DIAGNOSIS — Z7901 Long term (current) use of anticoagulants: Secondary | ICD-10-CM | POA: Diagnosis not present

## 2018-06-19 DIAGNOSIS — I4891 Unspecified atrial fibrillation: Secondary | ICD-10-CM

## 2018-06-19 LAB — POCT INR: INR: 2.7 (ref 2.0–3.0)

## 2018-06-19 NOTE — Patient Instructions (Signed)
Continue coumadin 2 tablets daily except 1 1/2 tablets on Sundays and Thursdays.  Recheck INR in 6 weeks.

## 2018-07-29 ENCOUNTER — Other Ambulatory Visit: Payer: Self-pay | Admitting: Cardiology

## 2018-07-29 ENCOUNTER — Telehealth: Payer: Self-pay | Admitting: *Deleted

## 2018-07-29 DIAGNOSIS — I480 Paroxysmal atrial fibrillation: Secondary | ICD-10-CM

## 2018-07-29 DIAGNOSIS — E7800 Pure hypercholesterolemia, unspecified: Secondary | ICD-10-CM

## 2018-07-29 DIAGNOSIS — Z953 Presence of xenogenic heart valve: Secondary | ICD-10-CM

## 2018-07-29 DIAGNOSIS — F17200 Nicotine dependence, unspecified, uncomplicated: Secondary | ICD-10-CM

## 2018-07-29 NOTE — Telephone Encounter (Signed)
Amlodipine 5 mg refilled. 

## 2018-07-29 NOTE — Telephone Encounter (Signed)
° °  COVID-19 Pre-Screening Questions: ° °• Do you currently have a fever?NO ° ° °• Have you recently travelled on a cruise, internationally, or to [place], [place], [place], [place], [place], or [place], [place] ([employer]) ? NO °•  °• Have you been in contact with someone that is currently pending confirmation of Covid19 testing or has been confirmed to have the Covid19 virus?  NO °•  °Are you currently experiencing fatigue or cough? NO ° ° °   ° ° ° ° °

## 2018-07-30 ENCOUNTER — Other Ambulatory Visit: Payer: Self-pay

## 2018-07-30 ENCOUNTER — Ambulatory Visit (INDEPENDENT_AMBULATORY_CARE_PROVIDER_SITE_OTHER): Payer: Medicare Other | Admitting: *Deleted

## 2018-07-30 DIAGNOSIS — Z7901 Long term (current) use of anticoagulants: Secondary | ICD-10-CM

## 2018-07-30 DIAGNOSIS — Z953 Presence of xenogenic heart valve: Secondary | ICD-10-CM | POA: Diagnosis not present

## 2018-07-30 DIAGNOSIS — I4891 Unspecified atrial fibrillation: Secondary | ICD-10-CM

## 2018-07-30 DIAGNOSIS — I2699 Other pulmonary embolism without acute cor pulmonale: Secondary | ICD-10-CM | POA: Diagnosis not present

## 2018-07-30 LAB — POCT INR: INR: 3 (ref 2.0–3.0)

## 2018-07-30 NOTE — Patient Instructions (Signed)
Continue coumadin 2 tablets daily except 1 1/2 tablets on Sundays and Thursdays.  Recheck INR in 6 weeks.

## 2018-09-10 ENCOUNTER — Ambulatory Visit (INDEPENDENT_AMBULATORY_CARE_PROVIDER_SITE_OTHER): Payer: Medicare Other | Admitting: *Deleted

## 2018-09-10 DIAGNOSIS — I4891 Unspecified atrial fibrillation: Secondary | ICD-10-CM

## 2018-09-10 DIAGNOSIS — Z5181 Encounter for therapeutic drug level monitoring: Secondary | ICD-10-CM

## 2018-09-10 DIAGNOSIS — Z7901 Long term (current) use of anticoagulants: Secondary | ICD-10-CM

## 2018-09-10 LAB — POCT INR: INR: 2.9 (ref 2.0–3.0)

## 2018-09-10 NOTE — Patient Instructions (Signed)
Continue coumadin 2 tablets daily except 1 1/2 tablets on Sundays and Thursdays.  Recheck INR in 6 weeks.

## 2018-10-10 ENCOUNTER — Other Ambulatory Visit: Payer: Self-pay | Admitting: *Deleted

## 2018-10-10 DIAGNOSIS — I253 Aneurysm of heart: Secondary | ICD-10-CM

## 2018-10-10 DIAGNOSIS — Z953 Presence of xenogenic heart valve: Secondary | ICD-10-CM

## 2018-10-11 ENCOUNTER — Other Ambulatory Visit: Payer: Self-pay | Admitting: Cardiology

## 2018-10-11 DIAGNOSIS — F17200 Nicotine dependence, unspecified, uncomplicated: Secondary | ICD-10-CM

## 2018-10-11 DIAGNOSIS — E7800 Pure hypercholesterolemia, unspecified: Secondary | ICD-10-CM

## 2018-10-11 DIAGNOSIS — Z953 Presence of xenogenic heart valve: Secondary | ICD-10-CM

## 2018-10-11 DIAGNOSIS — I480 Paroxysmal atrial fibrillation: Secondary | ICD-10-CM

## 2018-10-22 ENCOUNTER — Other Ambulatory Visit: Payer: Self-pay

## 2018-10-22 ENCOUNTER — Ambulatory Visit (INDEPENDENT_AMBULATORY_CARE_PROVIDER_SITE_OTHER): Payer: Medicare Other | Admitting: *Deleted

## 2018-10-22 DIAGNOSIS — Z5181 Encounter for therapeutic drug level monitoring: Secondary | ICD-10-CM

## 2018-10-22 DIAGNOSIS — Z7901 Long term (current) use of anticoagulants: Secondary | ICD-10-CM

## 2018-10-22 DIAGNOSIS — I4891 Unspecified atrial fibrillation: Secondary | ICD-10-CM

## 2018-10-22 LAB — POCT INR: INR: 2.6 (ref 2.0–3.0)

## 2018-10-22 NOTE — Patient Instructions (Signed)
Continue coumadin 2 tablets daily except 1 1/2 tablets on Sundays and Thursdays.  Recheck INR in 6 weeks.

## 2018-10-31 ENCOUNTER — Other Ambulatory Visit: Payer: Self-pay | Admitting: Cardiology

## 2018-12-03 ENCOUNTER — Other Ambulatory Visit: Payer: Self-pay

## 2018-12-03 ENCOUNTER — Ambulatory Visit (INDEPENDENT_AMBULATORY_CARE_PROVIDER_SITE_OTHER): Payer: Medicare Other | Admitting: *Deleted

## 2018-12-03 DIAGNOSIS — I4891 Unspecified atrial fibrillation: Secondary | ICD-10-CM

## 2018-12-03 DIAGNOSIS — Z5181 Encounter for therapeutic drug level monitoring: Secondary | ICD-10-CM

## 2018-12-03 DIAGNOSIS — Z7901 Long term (current) use of anticoagulants: Secondary | ICD-10-CM

## 2018-12-03 LAB — POCT INR: INR: 2.7 (ref 2.0–3.0)

## 2018-12-03 NOTE — Patient Instructions (Signed)
Continue coumadin 2 tablets daily except 1 1/2 tablets on Sundays and Thursdays.  Recheck INR in 7 weeks.

## 2019-01-06 ENCOUNTER — Other Ambulatory Visit: Payer: Self-pay | Admitting: Cardiology

## 2019-01-06 DIAGNOSIS — I480 Paroxysmal atrial fibrillation: Secondary | ICD-10-CM

## 2019-01-06 DIAGNOSIS — Z953 Presence of xenogenic heart valve: Secondary | ICD-10-CM

## 2019-01-06 DIAGNOSIS — F17200 Nicotine dependence, unspecified, uncomplicated: Secondary | ICD-10-CM

## 2019-01-06 DIAGNOSIS — E7800 Pure hypercholesterolemia, unspecified: Secondary | ICD-10-CM

## 2019-01-13 ENCOUNTER — Telehealth: Payer: Self-pay | Admitting: Pharmacist

## 2019-01-13 MED ORDER — APIXABAN 5 MG PO TABS: 5.0000 mg | ORAL_TABLET | Freq: Two times a day (BID) | ORAL | 1 refills | Status: DC

## 2019-01-13 NOTE — Telephone Encounter (Signed)
Called pt to discuss changing to Eliquis per last office visit with Dr Stanford Breed in December 2019. Pt has Pharmacist, community and qualifies for $10 copay card. Pt is interested in changing. She qualifies for Eliquis 5mg  BID dosing based on age, weight, and renal function. Will need updated BMET and CBC when she comes in for her INR check next week.  Rx has already been sent to pharmacy and copay card applied - confirmed $30 for 3 month supply per pt preference.

## 2019-01-22 ENCOUNTER — Ambulatory Visit (INDEPENDENT_AMBULATORY_CARE_PROVIDER_SITE_OTHER): Payer: Medicare Other | Admitting: *Deleted

## 2019-01-22 ENCOUNTER — Other Ambulatory Visit: Payer: Self-pay

## 2019-01-22 ENCOUNTER — Other Ambulatory Visit: Payer: Self-pay | Admitting: Cardiology

## 2019-01-22 DIAGNOSIS — I4891 Unspecified atrial fibrillation: Secondary | ICD-10-CM

## 2019-01-22 DIAGNOSIS — Z5181 Encounter for therapeutic drug level monitoring: Secondary | ICD-10-CM | POA: Diagnosis not present

## 2019-01-22 DIAGNOSIS — Z7901 Long term (current) use of anticoagulants: Secondary | ICD-10-CM | POA: Diagnosis not present

## 2019-01-22 LAB — BASIC METABOLIC PANEL
BUN: 14 mg/dL (ref 7–25)
CO2: 30 mmol/L (ref 20–32)
Calcium: 9 mg/dL (ref 8.6–10.4)
Chloride: 102 mmol/L (ref 98–110)
Creat: 0.77 mg/dL (ref 0.50–0.99)
Glucose, Bld: 153 mg/dL — ABNORMAL HIGH (ref 65–139)
Potassium: 4 mmol/L (ref 3.5–5.3)
Sodium: 140 mmol/L (ref 135–146)

## 2019-01-22 LAB — CBC
HCT: 39.6 % (ref 35.0–45.0)
Hemoglobin: 13.2 g/dL (ref 11.7–15.5)
MCH: 30.6 pg (ref 27.0–33.0)
MCHC: 33.3 g/dL (ref 32.0–36.0)
MCV: 91.7 fL (ref 80.0–100.0)
MPV: 10.7 fL (ref 7.5–12.5)
Platelets: 238 10*3/uL (ref 140–400)
RBC: 4.32 10*6/uL (ref 3.80–5.10)
RDW: 13.1 % (ref 11.0–15.0)
WBC: 9.6 10*3/uL (ref 3.8–10.8)

## 2019-01-22 LAB — POCT INR: INR: 2.8 (ref 2.0–3.0)

## 2019-01-22 NOTE — Patient Instructions (Signed)
Stop warfarin today.  Start Eliquis 5mg  twice daily on Friday morning (01/24/19)   [facility]  Appt made for 1 month Eliquis follow up.

## 2019-02-19 ENCOUNTER — Encounter: Payer: Self-pay | Admitting: *Deleted

## 2019-02-24 ENCOUNTER — Other Ambulatory Visit: Payer: Self-pay | Admitting: Cardiology

## 2019-02-24 DIAGNOSIS — I4891 Unspecified atrial fibrillation: Secondary | ICD-10-CM | POA: Diagnosis not present

## 2019-02-24 DIAGNOSIS — Z7901 Long term (current) use of anticoagulants: Secondary | ICD-10-CM | POA: Diagnosis not present

## 2019-02-24 LAB — CBC
HCT: 39.3 % (ref 35.0–45.0)
Hemoglobin: 12.7 g/dL (ref 11.7–15.5)
MCH: 29.7 pg (ref 27.0–33.0)
MCHC: 32.3 g/dL (ref 32.0–36.0)
MCV: 91.8 fL (ref 80.0–100.0)
MPV: 10 fL (ref 7.5–12.5)
Platelets: 251 10*3/uL (ref 140–400)
RBC: 4.28 10*6/uL (ref 3.80–5.10)
RDW: 12.9 % (ref 11.0–15.0)
WBC: 7.3 10*3/uL (ref 3.8–10.8)

## 2019-02-24 LAB — BASIC METABOLIC PANEL
BUN: 15 mg/dL (ref 7–25)
CO2: 32 mmol/L (ref 20–32)
Calcium: 9.3 mg/dL (ref 8.6–10.4)
Chloride: 104 mmol/L (ref 98–110)
Creat: 0.83 mg/dL (ref 0.50–0.99)
Glucose, Bld: 103 mg/dL (ref 65–139)
Potassium: 4.1 mmol/L (ref 3.5–5.3)
Sodium: 142 mmol/L (ref 135–146)

## 2019-02-25 ENCOUNTER — Encounter: Payer: Self-pay | Admitting: *Deleted

## 2019-03-05 ENCOUNTER — Ambulatory Visit (INDEPENDENT_AMBULATORY_CARE_PROVIDER_SITE_OTHER): Payer: Medicare Other | Admitting: *Deleted

## 2019-03-05 ENCOUNTER — Other Ambulatory Visit: Payer: Self-pay

## 2019-03-05 DIAGNOSIS — Z7901 Long term (current) use of anticoagulants: Secondary | ICD-10-CM

## 2019-03-05 DIAGNOSIS — I4891 Unspecified atrial fibrillation: Secondary | ICD-10-CM | POA: Diagnosis not present

## 2019-03-05 MED ORDER — APIXABAN 5 MG PO TABS: 5.0000 mg | ORAL_TABLET | Freq: Two times a day (BID) | ORAL | 3 refills | Status: DC

## 2019-03-05 NOTE — Progress Notes (Signed)
1 month Eliquis follow up:  Pt was started on Eliquis 5mg  twice daily on 01/24/19 for atrial Fibrillation  She denies any adverse effects since starting Eliquis.  She has not had any excessive bruising, bleeding or GI upset.    Reviewed patients medication list.  Pt is not currently on any combined P-gp and strong CYP3A4 inhibitors/inducers (ketoconazole, traconazole, ritonavir, carbamazepine, phenytoin, rifampin, St. John's wort).  Reviewed labs from 02/24/19.  SCr 0.83, Weight 72.1, CrCl 72.71.  Dose is appropriate based on age, weight, and SCr.  Hgb and HCT:  12.7/39.3   A full discussion of the nature of anticoagulants has been carried out.  A benefit/risk analysis has been presented to the patient, so that they understand the justification for choosing anticoagulation with Eliquis at this time.  The need for compliance is stressed.  Pt is aware to take the medication twice daily.  Side effects of potential bleeding are discussed, including unusual colored urine or stools, coughing up blood or coffee ground emesis, nose bleeds or serious fall or head trauma.  Discussed signs and symptoms of stroke. The patient should avoid any OTC items containing aspirin or ibuprofen.  Avoid alcohol consumption.   Call if any signs of abnormal bleeding.  Discussed financial obligations and resolved any difficulty in obtaining medication.    Discussed lab results with patient and 6 month follow up appt made.

## 2019-03-25 ENCOUNTER — Other Ambulatory Visit: Payer: Self-pay | Admitting: Cardiology

## 2019-03-25 DIAGNOSIS — Z953 Presence of xenogenic heart valve: Secondary | ICD-10-CM

## 2019-03-25 DIAGNOSIS — I480 Paroxysmal atrial fibrillation: Secondary | ICD-10-CM

## 2019-03-25 DIAGNOSIS — F17200 Nicotine dependence, unspecified, uncomplicated: Secondary | ICD-10-CM

## 2019-03-25 DIAGNOSIS — E7800 Pure hypercholesterolemia, unspecified: Secondary | ICD-10-CM

## 2019-03-30 ENCOUNTER — Inpatient Hospital Stay (HOSPITAL_COMMUNITY)
Admission: EM | Admit: 2019-03-30 | Discharge: 2019-04-01 | DRG: 286 | Disposition: A | Discharge status: Home / Self Care | Payer: Medicare Other | Attending: Internal Medicine | Admitting: Internal Medicine

## 2019-03-30 ENCOUNTER — Encounter (HOSPITAL_COMMUNITY): Payer: Self-pay | Admitting: Emergency Medicine

## 2019-03-30 ENCOUNTER — Inpatient Hospital Stay (HOSPITAL_COMMUNITY): Payer: Medicare Other

## 2019-03-30 ENCOUNTER — Emergency Department (HOSPITAL_COMMUNITY): Payer: Medicare Other

## 2019-03-30 ENCOUNTER — Other Ambulatory Visit: Payer: Self-pay

## 2019-03-30 DIAGNOSIS — Z780 Asymptomatic menopausal state: Secondary | ICD-10-CM

## 2019-03-30 DIAGNOSIS — Z20822 Contact with and (suspected) exposure to covid-19: Secondary | ICD-10-CM | POA: Diagnosis present

## 2019-03-30 DIAGNOSIS — Z23 Encounter for immunization: Secondary | ICD-10-CM | POA: Diagnosis present

## 2019-03-30 DIAGNOSIS — I340 Nonrheumatic mitral (valve) insufficiency: Secondary | ICD-10-CM

## 2019-03-30 DIAGNOSIS — Z953 Presence of xenogenic heart valve: Secondary | ICD-10-CM

## 2019-03-30 DIAGNOSIS — I5032 Chronic diastolic (congestive) heart failure: Secondary | ICD-10-CM | POA: Diagnosis present

## 2019-03-30 DIAGNOSIS — K250 Acute gastric ulcer with hemorrhage: Secondary | ICD-10-CM | POA: Diagnosis not present

## 2019-03-30 DIAGNOSIS — R0902 Hypoxemia: Secondary | ICD-10-CM

## 2019-03-30 DIAGNOSIS — K449 Diaphragmatic hernia without obstruction or gangrene: Secondary | ICD-10-CM | POA: Diagnosis present

## 2019-03-30 DIAGNOSIS — I359 Nonrheumatic aortic valve disorder, unspecified: Secondary | ICD-10-CM

## 2019-03-30 DIAGNOSIS — K862 Cyst of pancreas: Secondary | ICD-10-CM | POA: Diagnosis present

## 2019-03-30 DIAGNOSIS — T82857A Stenosis of cardiac prosthetic devices, implants and grafts, initial encounter: Principal | ICD-10-CM | POA: Diagnosis present

## 2019-03-30 DIAGNOSIS — K922 Gastrointestinal hemorrhage, unspecified: Secondary | ICD-10-CM | POA: Diagnosis present

## 2019-03-30 DIAGNOSIS — K259 Gastric ulcer, unspecified as acute or chronic, without hemorrhage or perforation: Secondary | ICD-10-CM | POA: Diagnosis present

## 2019-03-30 DIAGNOSIS — Z91048 Other nonmedicinal substance allergy status: Secondary | ICD-10-CM

## 2019-03-30 DIAGNOSIS — I110 Hypertensive heart disease with heart failure: Secondary | ICD-10-CM | POA: Diagnosis present

## 2019-03-30 DIAGNOSIS — I959 Hypotension, unspecified: Secondary | ICD-10-CM | POA: Diagnosis not present

## 2019-03-30 DIAGNOSIS — J9601 Acute respiratory failure with hypoxia: Secondary | ICD-10-CM | POA: Diagnosis present

## 2019-03-30 DIAGNOSIS — Z6831 Body mass index (BMI) 31.0-31.9, adult: Secondary | ICD-10-CM | POA: Diagnosis not present

## 2019-03-30 DIAGNOSIS — G510 Bell's palsy: Secondary | ICD-10-CM | POA: Diagnosis present

## 2019-03-30 DIAGNOSIS — Z7901 Long term (current) use of anticoagulants: Secondary | ICD-10-CM

## 2019-03-30 DIAGNOSIS — Z87891 Personal history of nicotine dependence: Secondary | ICD-10-CM

## 2019-03-30 DIAGNOSIS — R195 Other fecal abnormalities: Secondary | ICD-10-CM

## 2019-03-30 DIAGNOSIS — I361 Nonrheumatic tricuspid (valve) insufficiency: Secondary | ICD-10-CM | POA: Diagnosis not present

## 2019-03-30 DIAGNOSIS — Z79899 Other long term (current) drug therapy: Secondary | ICD-10-CM

## 2019-03-30 DIAGNOSIS — G4730 Sleep apnea, unspecified: Secondary | ICD-10-CM | POA: Diagnosis present

## 2019-03-30 DIAGNOSIS — F17200 Nicotine dependence, unspecified, uncomplicated: Secondary | ICD-10-CM | POA: Diagnosis present

## 2019-03-30 DIAGNOSIS — I350 Nonrheumatic aortic (valve) stenosis: Secondary | ICD-10-CM | POA: Diagnosis present

## 2019-03-30 DIAGNOSIS — R778 Other specified abnormalities of plasma proteins: Secondary | ICD-10-CM | POA: Diagnosis not present

## 2019-03-30 DIAGNOSIS — Z9109 Other allergy status, other than to drugs and biological substances: Secondary | ICD-10-CM

## 2019-03-30 DIAGNOSIS — D62 Acute posthemorrhagic anemia: Secondary | ICD-10-CM | POA: Diagnosis present

## 2019-03-30 DIAGNOSIS — Z882 Allergy status to sulfonamides status: Secondary | ICD-10-CM | POA: Diagnosis not present

## 2019-03-30 DIAGNOSIS — K219 Gastro-esophageal reflux disease without esophagitis: Secondary | ICD-10-CM | POA: Diagnosis present

## 2019-03-30 DIAGNOSIS — R05 Cough: Secondary | ICD-10-CM | POA: Diagnosis present

## 2019-03-30 DIAGNOSIS — E669 Obesity, unspecified: Secondary | ICD-10-CM | POA: Diagnosis present

## 2019-03-30 DIAGNOSIS — M1990 Unspecified osteoarthritis, unspecified site: Secondary | ICD-10-CM | POA: Diagnosis present

## 2019-03-30 DIAGNOSIS — Z8249 Family history of ischemic heart disease and other diseases of the circulatory system: Secondary | ICD-10-CM

## 2019-03-30 DIAGNOSIS — K253 Acute gastric ulcer without hemorrhage or perforation: Secondary | ICD-10-CM

## 2019-03-30 DIAGNOSIS — E7800 Pure hypercholesterolemia, unspecified: Secondary | ICD-10-CM | POA: Diagnosis present

## 2019-03-30 DIAGNOSIS — I253 Aneurysm of heart: Secondary | ICD-10-CM | POA: Diagnosis present

## 2019-03-30 DIAGNOSIS — R0602 Shortness of breath: Secondary | ICD-10-CM

## 2019-03-30 DIAGNOSIS — I480 Paroxysmal atrial fibrillation: Secondary | ICD-10-CM | POA: Diagnosis present

## 2019-03-30 DIAGNOSIS — K921 Melena: Secondary | ICD-10-CM | POA: Diagnosis present

## 2019-03-30 DIAGNOSIS — K222 Esophageal obstruction: Secondary | ICD-10-CM | POA: Diagnosis not present

## 2019-03-30 DIAGNOSIS — Z86711 Personal history of pulmonary embolism: Secondary | ICD-10-CM

## 2019-03-30 DIAGNOSIS — Z823 Family history of stroke: Secondary | ICD-10-CM

## 2019-03-30 DIAGNOSIS — Z9981 Dependence on supplemental oxygen: Secondary | ICD-10-CM

## 2019-03-30 DIAGNOSIS — D519 Vitamin B12 deficiency anemia, unspecified: Secondary | ICD-10-CM | POA: Diagnosis present

## 2019-03-30 LAB — CBC
HCT: 31.5 % — ABNORMAL LOW (ref 36.0–46.0)
Hemoglobin: 9.6 g/dL — ABNORMAL LOW (ref 12.0–15.0)
MCH: 28.2 pg (ref 26.0–34.0)
MCHC: 30.5 g/dL (ref 30.0–36.0)
MCV: 92.6 fL (ref 80.0–100.0)
Platelets: 311 10*3/uL (ref 150–400)
RBC: 3.4 MIL/uL — ABNORMAL LOW (ref 3.87–5.11)
RDW: 14.4 % (ref 11.5–15.5)
WBC: 9.6 10*3/uL (ref 4.0–10.5)
nRBC: 0 % (ref 0.0–0.2)

## 2019-03-30 LAB — BASIC METABOLIC PANEL
Anion gap: 7 (ref 5–15)
BUN: 13 mg/dL (ref 8–23)
CO2: 27 mmol/L (ref 22–32)
Calcium: 8.8 mg/dL — ABNORMAL LOW (ref 8.9–10.3)
Chloride: 101 mmol/L (ref 98–111)
Creatinine, Ser: 0.82 mg/dL (ref 0.44–1.00)
GFR calc Af Amer: >60 mL/min (ref >60)
GFR calc non Af Amer: >60 mL/min (ref >60)
Glucose, Bld: 113 mg/dL — ABNORMAL HIGH (ref 70–99)
Potassium: 4.7 mmol/L (ref 3.5–5.1)
Sodium: 135 mmol/L (ref 135–145)

## 2019-03-30 LAB — RESPIRATORY PANEL BY RT PCR (FLU A&B, COVID)
Influenza A by PCR: NEGATIVE
Influenza B by PCR: NEGATIVE
SARS Coronavirus 2 by RT PCR: NEGATIVE

## 2019-03-30 LAB — CBC WITH DIFFERENTIAL/PLATELET
Abs Immature Granulocytes: 0.02 10*3/uL (ref 0.00–0.07)
Basophils Absolute: 0.1 10*3/uL (ref 0.0–0.1)
Basophils Relative: 1 %
Eosinophils Absolute: 0.1 10*3/uL (ref 0.0–0.5)
Eosinophils Relative: 1 %
HCT: 30.2 % — ABNORMAL LOW (ref 36.0–46.0)
Hemoglobin: 9.2 g/dL — ABNORMAL LOW (ref 12.0–15.0)
Immature Granulocytes: 0 %
Lymphocytes Relative: 30 %
Lymphs Abs: 2.9 10*3/uL (ref 0.7–4.0)
MCH: 28.1 pg (ref 26.0–34.0)
MCHC: 30.5 g/dL (ref 30.0–36.0)
MCV: 92.4 fL (ref 80.0–100.0)
Monocytes Absolute: 1.1 10*3/uL — ABNORMAL HIGH (ref 0.1–1.0)
Monocytes Relative: 12 %
Neutro Abs: 5.2 10*3/uL (ref 1.7–7.7)
Neutrophils Relative %: 56 %
Platelets: 297 10*3/uL (ref 150–400)
RBC: 3.27 MIL/uL — ABNORMAL LOW (ref 3.87–5.11)
RDW: 14.5 % (ref 11.5–15.5)
WBC: 9.4 10*3/uL (ref 4.0–10.5)
nRBC: 0 % (ref 0.0–0.2)

## 2019-03-30 LAB — HIV ANTIBODY (ROUTINE TESTING W REFLEX): HIV Screen 4th Generation wRfx: NONREACTIVE

## 2019-03-30 LAB — TYPE AND SCREEN
ABO/RH(D): A POS
Antibody Screen: NEGATIVE

## 2019-03-30 LAB — ECHOCARDIOGRAM COMPLETE

## 2019-03-30 LAB — POC SARS CORONAVIRUS 2 AG -  ED: SARS Coronavirus 2 Ag: NEGATIVE

## 2019-03-30 LAB — TROPONIN I (HIGH SENSITIVITY)
Troponin I (High Sensitivity): 20 ng/L — ABNORMAL HIGH (ref <18)
Troponin I (High Sensitivity): 24 ng/L — ABNORMAL HIGH (ref <18)

## 2019-03-30 LAB — POC OCCULT BLOOD, ED: Fecal Occult Bld: POSITIVE — AB

## 2019-03-30 LAB — BRAIN NATRIURETIC PEPTIDE: B Natriuretic Peptide: 617.2 pg/mL — ABNORMAL HIGH (ref 0.0–100.0)

## 2019-03-30 MED ORDER — NICOTINE 21 MG/24HR TD PT24
21.0000 mg | MEDICATED_PATCH | Freq: Every day | TRANSDERMAL | Status: DC
  Administered 2019-03-31 – 2019-04-01 (×2): 21 mg via TRANSDERMAL
  Filled 2019-03-30 (×2): qty 1

## 2019-03-30 MED ORDER — PNEUMOCOCCAL VAC POLYVALENT 25 MCG/0.5ML IJ INJ
0.5000 mL | INJECTION | INTRAMUSCULAR | Status: AC
  Administered 2019-03-31: 0.5 mL via INTRAMUSCULAR
  Filled 2019-03-30: qty 0.5

## 2019-03-30 MED ORDER — ALBUTEROL SULFATE (2.5 MG/3ML) 0.083% IN NEBU: 2.5000 mg | INHALATION_SOLUTION | Freq: Four times a day (QID) | RESPIRATORY_TRACT | Status: DC | PRN

## 2019-03-30 MED ORDER — SODIUM CHLORIDE 0.9% FLUSH: 3.0000 mL | Freq: Once | INTRAVENOUS | Status: DC

## 2019-03-30 MED ORDER — AMLODIPINE BESYLATE 5 MG PO TABS
5.0000 mg | ORAL_TABLET | Freq: Every day | ORAL | Status: DC
  Administered 2019-03-31: 5 mg via ORAL
  Filled 2019-03-30: qty 1

## 2019-03-30 MED ORDER — ACETAMINOPHEN 325 MG PO TABS: 650.0000 mg | ORAL_TABLET | ORAL | Status: DC | PRN

## 2019-03-30 MED ORDER — ONDANSETRON HCL 4 MG/2ML IJ SOLN: 4.0000 mg | Freq: Four times a day (QID) | INTRAMUSCULAR | Status: DC | PRN

## 2019-03-30 MED ORDER — FUROSEMIDE 10 MG/ML IJ SOLN
20.0000 mg | Freq: Once | INTRAMUSCULAR | Status: AC
  Administered 2019-03-30: 20 mg via INTRAVENOUS
  Filled 2019-03-30: qty 2

## 2019-03-30 MED ORDER — SODIUM CHLORIDE 0.9 % IV SOLN: 250.0000 mL | INTRAVENOUS | Status: DC | PRN

## 2019-03-30 MED ORDER — IPRATROPIUM-ALBUTEROL 0.5-2.5 (3) MG/3ML IN SOLN
3.0000 mL | Freq: Four times a day (QID) | RESPIRATORY_TRACT | Status: DC
  Administered 2019-03-30 – 2019-03-31 (×2): 3 mL via RESPIRATORY_TRACT
  Filled 2019-03-30 (×2): qty 3

## 2019-03-30 MED ORDER — PANTOPRAZOLE SODIUM 40 MG PO TBEC
40.0000 mg | DELAYED_RELEASE_TABLET | Freq: Two times a day (BID) | ORAL | Status: DC
  Administered 2019-03-30 – 2019-04-01 (×4): 40 mg via ORAL
  Filled 2019-03-30 (×4): qty 1

## 2019-03-30 MED ORDER — CHLORHEXIDINE GLUCONATE CLOTH 2 % EX PADS
6.0000 | MEDICATED_PAD | Freq: Every day | CUTANEOUS | Status: DC
  Administered 2019-03-31 – 2019-04-01 (×2): 6 via TOPICAL

## 2019-03-30 MED ORDER — DEXTROMETHORPHAN POLISTIREX ER 30 MG/5ML PO SUER
30.0000 mg | ORAL | Status: DC | PRN
  Filled 2019-03-30: qty 5

## 2019-03-30 MED ORDER — SODIUM CHLORIDE 0.9% FLUSH: 3.0000 mL | INTRAVENOUS | Status: DC | PRN

## 2019-03-30 MED ORDER — SODIUM CHLORIDE 0.9% FLUSH
3.0000 mL | Freq: Two times a day (BID) | INTRAVENOUS | Status: DC
  Administered 2019-03-30 – 2019-03-31 (×3): 3 mL via INTRAVENOUS

## 2019-03-30 MED ORDER — METOPROLOL SUCCINATE ER 25 MG PO TB24
25.0000 mg | ORAL_TABLET | Freq: Two times a day (BID) | ORAL | Status: DC
  Administered 2019-03-30 – 2019-03-31 (×3): 25 mg via ORAL
  Filled 2019-03-30 (×3): qty 1

## 2019-03-30 NOTE — Progress Notes (Signed)
  Echocardiogram 2D Echocardiogram has been performed.  Terri Edwards 03/30/2019, 5:10 PM

## 2019-03-30 NOTE — ED Triage Notes (Addendum)
Pt reports SOB with exertion x 2-3 days.  Denies pain, fever, or chills.  Hx of afib. Pt 83% on room air on arrival.  Placed on 2 liters [place].

## 2019-03-30 NOTE — H&P (Signed)
History and Physical    Terri Edwards [id number] DOB: 08-31-1950 DOA: 03/30/2019  Referring MD/NP/PA:Wylder, PA-C PCP: Asencion Noble, MD  Patient coming from: Home  Chief Complaint: Shortness of breath  I have personally briefly reviewed patient's old medical records in [place]   HPI: Terri Edwards is a 69 y.o. female with medical history significant of aortic stenosis s/p bioprosthetic AVR followed by Dr. Stanford Breed, paroxysmal atrial fibrillation, PE, hypertension, hyperlipidemia, and obesity.  She presents with complaints of progressively worsening shortness of breath over the last few weeks.  She noticed that she was unable to walk as far as she previously could without getting winded or needing to rest.  Symptoms seem to be worse in the morning upon wakening.  She reports that she is intermittently had some dark stools within the last month.  However, she was recently switched from Coumadin over to Eliquis in November.  She reports utilizing Aleve orTylenol once or twice per month. Patient denies seeing any gross blood in her stools.  Other associated symptoms include intermittent cough but patient reports that she smokes cigarettes/day on average.   ED Course: Upon admission into the emergency department patient was seen to be hypoxic down to 83% on room air for which she was placed on 2 L nasal cannula oxygen with improvement to 98%.  Labs significant for hemoglobin 9.6, BNP 617.2 troponin 20->24.  Chest x-ray showing cardiomegaly without edema or infiltrate.  Review of Systems  Constitutional: Positive for malaise/fatigue. Negative for fever.  HENT: Positive for congestion. Negative for sore throat.   Eyes: Negative for photophobia and pain.  Respiratory: Positive for cough and shortness of breath.   Cardiovascular: Negative for chest pain and leg swelling.  Gastrointestinal: Negative for abdominal pain, nausea and vomiting.  Genitourinary: Negative for dysuria and  hematuria.  Musculoskeletal: Negative for falls and myalgias.  Skin: Negative for rash.  Neurological: Negative for focal weakness.  Psychiatric/Behavioral: Negative for memory loss and substance abuse.    Past Medical History:  Diagnosis Date  . Aortic stenosis    a. echo 1/13: normal EF, severe AS, mean gradient 50 mmHg => s/p AVR 4/13;   b.Echo 07/14/11: Mod LVH, EF 55-60%, grade 2 diast dysfxn, AVR mean gradient 21 mmHg, mod to severe LAE, mild RVE, mod RAE, PASP 32   . Arthritis   . Atrial fibrillation ([place])    Post operative following AVR  . Bell's palsy 2005  . 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 GERD (gastroesophageal reflux disease)   . HTN (hypertension), benign   . Hx of cardiac cath    [facility] 4/13:  no CAD, severe AS  . Hypercholesteremia   . Obesity (BMI 30.0-34.9) 06/29/2011  . Pseudoaneurysm of left ventricle of heart   . Pulmonary embolus ([facility])    following AVR  . S/P aortic valve replacement with bioprosthetic valve 07/05/2011   34mm [facility] Magna Ease pericardial tissue valve    Past Surgical History:  Procedure Laterality Date  . AORTIC VALVE REPLACEMENT  07/05/2011   Procedure: AORTIC VALVE REPLACEMENT (AVR);  Surgeon: Rexene Alberts, MD;  Location: [facility];  Service: Open Heart Surgery;  Laterality: N/A;  . CATARACT EXTRACTION     bil  . KNEE ARTHROSCOPY  lft  . PARS PLANA VITRECTOMY W/ REPAIR OF MACULAR HOLE       reports that she quit smoking about 7 years ago. Her smoking use included cigarettes. She has a 45.00 pack-year  smoking history. She has never used smokeless tobacco. She reports current alcohol use of about 1.0 standard drinks of alcohol per week. No history on file for drug.  Allergies  Allergen Reactions  . Nickel     Rash blisters  . Sulfa Antibiotics     Blisters  Rash   . Tape     Slight skin irritation    Family History  Problem Relation Age of Onset  . Stroke Father   . Heart attack Mother     Prior to Admission  medications   Medication Sig Start Date End Date Taking? Authorizing Provider  albuterol (PROVENTIL HFA;VENTOLIN HFA) 108 (90 Base) MCG/ACT inhaler Inhale 2 puffs into the lungs every 4 (four) hours as needed for wheezing or shortness of breath.    [provider]  amLODipine (NORVASC) 5 MG tablet TAKE 1 TABLET BY MOUTH EVERY DAY 03/27/19   Lelon Perla, MD  amoxicillin (AMOXIL) 500 MG capsule Take 4 tablets = 2 grams 30-60 minutes before dental procedures 02/19/18   Lelon Perla, MD  apixaban (ELIQUIS) 5 MG TABS tablet Take 1 tablet (5 mg total) by mouth 2 (two) times daily. 03/05/19   Lelon Perla, MD  metoprolol succinate (TOPROL-XL) 25 MG 24 hr tablet TAKE 1 TABLET (25 MG TOTAL) BY MOUTH EVERY 12 (TWELVE) HOURS. 05/29/18   Lelon Perla, MD  pantoprazole (PROTONIX) 40 MG tablet Take 1 tablet (40 mg total) by mouth 2 (two) times daily. For reflux Patient taking differently: Take 40 mg by mouth 2 (two) times daily as needed. For reflux 02/19/18   Lelon Perla, MD    Physical Exam:  Constitutional: Obese female who appears to be in some respiratory discomfort Vitals:   03/30/19 0859 03/30/19 0907 03/30/19 1132 03/30/19 1336  BP: 128/69  (!) 114/52   Pulse: 90  69 79  Resp: 20  17   Temp: 98.3 F (36.8 C)  97.8 F (36.6 C)   TempSrc: Oral  Oral   SpO2: (!) 83% 96% 98% (!) 87%   Eyes: PERRL, lids and conjunctivae normal ENMT: Mucous membranes are moist. Posterior pharynx clear of any exudate or lesions.  Neck: normal, supple, no masses, no thyromegaly Respiratory: O2 saturations into the 80s on room air while walking back from the bathroom.  Patient only able to talk in shortened sentences and was temporarily tachypneic until placed back on 2 L of nasal cannula oxygen with improvement of O2 saturations to 92%.  She is noted to have decreased overall aeration, but no clear wheezing appreciated. Cardiovascular: Regular rate and rhythm, no murmurs / rubs /  gallops. No extremity edema. 2+ pedal pulses. No carotid bruits.  Abdomen: no tenderness, no masses palpated. No hepatosplenomegaly. Bowel sounds positive.  Musculoskeletal: no clubbing / cyanosis. No joint deformity upper and lower extremities. Good ROM, no contractures. Normal muscle tone.  Skin: no rashes, lesions, ulcers. No induration Neurologic: CN 2-12 grossly intact. Sensation intact, DTR normal. Strength 5/5 in all 4.  Psychiatric: Normal judgment and insight. Alert and oriented x 3. Normal mood.     Labs on Admission: I have personally reviewed following labs and imaging studies  CBC: Recent Labs  Lab 03/30/19 0900  WBC 9.6  HGB 9.6*  HCT 31.5*  MCV 92.6  PLT [id number]   Basic Metabolic Panel: Recent Labs  Lab 03/30/19 0900  NA 135  K 4.7  CL 101  CO2 27  GLUCOSE 113*  BUN 13  CREATININE 0.82  CALCIUM 8.8*   GFR: CrCl cannot be calculated (Unknown ideal weight.). Liver Function Tests: No results for input(s): AST, ALT, ALKPHOS, BILITOT, PROT, ALBUMIN in the last 168 hours. No results for input(s): LIPASE, AMYLASE in the last 168 hours. No results for input(s): AMMONIA in the last 168 hours. Coagulation Profile: No results for input(s): INR, PROTIME in the last 168 hours. Cardiac Enzymes: No results for input(s): CKTOTAL, CKMB, CKMBINDEX, TROPONINI in the last 168 hours. BNP (last 3 results) No results for input(s): PROBNP in the last 8760 hours. HbA1C: No results for input(s): HGBA1C in the last 72 hours. CBG: No results for input(s): GLUCAP in the last 168 hours. Lipid Profile: No results for input(s): CHOL, HDL, LDLCALC, TRIG, CHOLHDL, LDLDIRECT in the last 72 hours. Thyroid Function Tests: No results for input(s): TSH, T4TOTAL, FREET4, T3FREE, THYROIDAB in the last 72 hours. Anemia Panel: No results for input(s): VITAMINB12, FOLATE, FERRITIN, TIBC, IRON, RETICCTPCT in the last 72 hours. Urine analysis:    Component Value Date/Time   COLORURINE YELLOW  07/13/2011 1618   APPEARANCEUR CLEAR 07/13/2011 1618   LABSPEC 1.011 07/13/2011 1618   PHURINE 6.0 07/13/2011 1618   GLUCOSEU NEGATIVE 07/13/2011 1618   HGBUR NEGATIVE 07/13/2011 1618   BILIRUBINUR NEGATIVE 07/13/2011 1618   KETONESUR NEGATIVE 07/13/2011 1618   PROTEINUR NEGATIVE 07/13/2011 1618   UROBILINOGEN 1.0 07/13/2011 1618   NITRITE NEGATIVE 07/13/2011 1618   LEUKOCYTESUR NEGATIVE 07/13/2011 1618   Sepsis Labs: No results found for this or any previous visit (from the past 240 hour(s)).   Radiological Exams on Admission: DG Chest Portable 1 View  Result Date: 03/30/2019 CLINICAL DATA:  Shortness of breath, hypoxia EXAM: PORTABLE CHEST 1 VIEW COMPARISON:  Chest radiograph dated 07/28/2011 FINDINGS: The heart is enlarged. Vascular calcifications are seen in the aortic arch. Median sternotomy wires and an aortic valve prosthesis are redemonstrated. The lungs are clear. There is no pleural effusion or pneumothorax. Degenerative changes are seen in the spine. IMPRESSION: Cardiomegaly.  No acute cardiopulmonary findings. Aortic Atherosclerosis (ICD10-I70.0). Electronically Signed   By: Zerita Boers M.D.   On: 03/30/2019 10:34    EKG: Independently reviewed.  Sinus rhythm 80 bpm  Assessment/Plan Acute respiratory failure with hypoxia: Acute.  Patient presents with progressively worsening shortness of breath.  Found to have O2 saturations were 83% requiring 2 L nasal oxygen to maintain O2 saturation.  Chest x-ray noting cardiomegaly without overt edema.  Suspect symptoms secondary to acute blood loss vs. COPD exacerbation vs. diastolic heart failure exacerbation. -Admit to telemetry bed -Continuous pulse oximetry with nasal cannula oxygen to maintain O2 saturations. -DuoNebs 4 times daily  Acute blood loss anemia, suspected GI bleed: Patient's hemoglobin previously noted to be 9.6 on admission with guaiac positive stools.  Baseline hemoglobin previously noted to be 12.7 oin  02/2019. -Type and screen for possible need of blood products -Serial monitoring of H&H -Transfuse blood products as needed -Consulted Dr. Lyndel Safe of [facility] who we will evaluate in a.m.  Paroxysmal atrial fibrillation on chronic anticoagulation: Patient with history of paroxysmal atrial fibrillation on Eliquis, but had been previously on Coumadin.  She follows with Dr. Stanford Breed in outpatient setting. -Hold Eliquis -Message sent for cardiology to evaluate in a.m.  Diastolic congestive heart: Last EF noted to be 55 to 60% with moderate aortic valve stenosis at that time in 2018.  She does not appear fluid overloaded on physical exam.  BNP elevated at 617.2.   -Strict intake and output -Daily weight -Give  Lasix 20 mg IV x1 dose  -Reassess in a.m. and determine if need of continued diuresis -Follow-up echocardiogram  Elevated troponin: Acute.  High-sensitivity troponin 20-> 24.  Patient denies having any chest pain and EKG without signs of ischemia. -Continue to monitor  Aortic stenosis, s/p bioprosthetic aortic valve replacement  Essential hypertension: Home medications include amlodipine 5 mg daily and metoprolol 25 mg twice daily.  -Continue home medication regimen as tolerated  GERD -Continue Protonix    Tobacco abuse: Patient reports still continuing to smoke cigarettes on a daily basis. -Nicotine patch   DVT prophylaxis: SCDs Code Status: Full Family Communication: No family present at bedside Disposition Plan: Possible discharge home in 2 to 3 days Consults called: Gastroenterology Admission status: Inpatient  Norval Morton MD [facility] Pager [phone]   If 7PM-7AM, please contact night-coverage [email or web address] Password [email or web address]  03/30/2019, 2:51 PM

## 2019-03-30 NOTE — ED Notes (Signed)
AMBULATING WITH PATIENT IN HALLWAY SATS WERE 93% HEARTRATE 73 ,WHEN PATIENT GOT INTO ROOM SATS WENT DOWN TO 86% NOW SHE CALM DOWN SATS 91% RM

## 2019-03-30 NOTE — ED Notes (Signed)
ED TO INPATIENT HANDOFF REPORT  ED Nurse Name and Phone #: Jori Moll RN [phone]  S Name/Age/Gender Terri Edwards 69 y.o. female Room/Bed: 025C/025C  Code Status   Code Status: Full Code  Home/SNF/Other Home Patient oriented to: self, place, time and situation Is this baseline? Yes   Triage Complete: Triage complete  Chief Complaint Acute respiratory failure with hypoxia ([place]) [J96.01]  Triage Note Pt reports SOB with exertion x 2-3 days.  Denies pain, fever, or chills.  Hx of afib. Pt 83% on room air on arrival.  Placed on 2 liters [place].    Allergies Allergies  Allergen Reactions  . Nickel     Rash blisters  . Sulfa Antibiotics     Blisters  Rash   . Tape     Slight skin irritation    Level of Care/Admitting Diagnosis ED Disposition    ED Disposition Condition [facility]: [place] [[id number]]  Level of Care: Telemetry Medical [[id number]]  Covid Evaluation: Asymptomatic Screening Protocol (No Symptoms)  Diagnosis: Acute respiratory failure with hypoxia [facility]) [[id number]]  Admitting Physician: Norval Morton [[id number]]  Attending Physician: Norval Morton [[id number]]  Estimated length of stay: past midnight tomorrow  Certification:: I certify this patient will need inpatient services for at least 2 midnights       B Medical/Surgery History Past Medical History:  Diagnosis Date  . Aortic stenosis    a. echo 1/13: normal EF, severe AS, mean gradient 50 mmHg => s/p AVR 4/13;   b.Echo 07/14/11: Mod LVH, EF 55-60%, grade 2 diast dysfxn, AVR mean gradient 21 mmHg, mod to severe LAE, mild RVE, mod RAE, PASP 32   . Arthritis   . Atrial fibrillation ([place])    Post operative following AVR  . Bell's palsy 2005  . 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 GERD (gastroesophageal reflux disease)   . HTN (hypertension), benign   . Hx of cardiac cath    [facility] 4/13:  no CAD, severe AS  . Hypercholesteremia   . Obesity (BMI 30.0-34.9)  06/29/2011  . Pseudoaneurysm of left ventricle of heart   . Pulmonary embolus (H[facility])    following AVR  . S/P aortic valve replacement with bioprosthetic valve 07/05/2011   18m Edwards Magna Ease pericardial tissue valve   Past Surgical History:  Procedure Laterality Date  . AORTIC VALVE REPLACEMENT  07/05/2011   Procedure: AORTIC VALVE REPLACEMENT (AVR);  Surgeon: CRexene Alberts MD;  [facility] [facility]  Service: Open Heart Surgery;  Laterality: N/A;  . CATARACT EXTRACTION     bil  . KNEE ARTHROSCOPY  lft  . PARS PLANA VITRECTOMY W/ REPAIR OF MACULAR HOLE       A IV Location/Drains/Wounds Patient Lines/Drains/Airways Status   Active Line/Drains/Airways    Name:   Placement date:   Placement time:   Site:   Days:   SGordy [place]  07/05/11    02094    2825   Incision 07/05/11 Chest Other (Comment)   07/05/11    1034     2825          Intake/Output Last 24 hours No intake or output data in the 24 hours ending 03/30/19 1923  Labs/Imaging Results for orders placed or performed during the hospital encounter of 03/30/19 (from the past 48 hour(s))  Basic metabolic panel     Status: Abnormal   Collection Time: 03/30/19  9:00 AM  Result  Value Ref Range   Sodium 135 135 - 145 mmol/L   Potassium 4.7 3.5 - 5.1 mmol/L   Chloride 101 98 - 111 mmol/L   CO2 27 22 - 32 mmol/L   Glucose, Bld 113 (H) 70 - 99 mg/dL   BUN 13 8 - 23 mg/dL   Creatinine, Ser 0.82 0.44 - 1.00 mg/dL   Calcium 8.8 (L) 8.9 - 10.3 mg/dL   GFR calc non Af Amer >60 >60 mL/min   GFR calc Af Amer >60 >60 mL/min   Anion gap 7 5 - 15    Comment: Performed at [facility] [address] [place], [place] [address]  CBC     Status: Abnormal   Collection Time: 03/30/19  9:00 AM  Result Value Ref Range   WBC 9.6 4.0 - 10.5 K/uL   RBC 3.40 (L) 3.87 - 5.11 MIL/uL   Hemoglobin 9.6 (L) 12.0 - 15.0 g/dL   HCT 31.5 (L) 36.0 - 46.0 %   MCV 92.6 80.0 - 100.0 fL   MCH 28.2 26.0 - 34.0 pg   MCHC 30.5 30.0 - 36.0 g/dL   RDW  14.4 11.5 - 15.5 %   Platelets 311 150 - 400 K/uL   nRBC 0.0 0.0 - 0.2 %    Comment: Performed at [facility], [place] [address] [place], [place] [address]  Troponin I (High Sensitivity)     Status: Abnormal   Collection Time: 03/30/19  9:00 AM  Result Value Ref Range   Troponin I (High Sensitivity) 20 (H) <18 ng/L    Comment: (NOTE) Elevated high sensitivity troponin I (hsTnI) values and significant  changes across serial measurements may suggest ACS but many other  chronic and acute conditions are known to elevate hsTnI results.  Refer to the "Links" section for chest pain algorithms and additional  guidance. Performed at [facility], [place] [address], [place], [place] [address]   Brain natriuretic peptide     Status: Abnormal   Collection Time: 03/30/19  9:00 AM  Result Value Ref Range   B Natriuretic Peptide 617.2 (H) 0.0 - 100.0 pg/mL    Comment: Performed at [facility] [address] [place], [place] [address]  POC SARS Coronavirus 2 Ag-ED - Nasal Swab (BD Veritor Kit)     Status: None   Collection Time: 03/30/19 10:53 AM  Result Value Ref Range   SARS Coronavirus 2 Ag NEGATIVE NEGATIVE    Comment: (NOTE) SARS-CoV-2 antigen NOT DETECTED.  Negative results are presumptive.  Negative results do not preclude SARS-CoV-2 infection and should not be used as the sole basis for treatment or other patient management decisions, including infection  control decisions, particularly in the presence of clinical signs and  symptoms consistent with COVID-19, or in those who have been in contact with the virus.  Negative results must be combined with clinical observations, patient history, and epidemiological information. The expected result is Negative. Fact Sheet for Patients: PodPark.tn Fact Sheet for Healthcare Providers: GiftContent.is This test is not yet approved or cleared by the [place] FDA and   has been authorized for detection and/or diagnosis of SARS-CoV-2 by FDA under an Emergency Use Authorization (EUA).  This EUA will remain in effect (meaning this test can be used) for the duration of  the COVID-19 de claration under Section 564(b)(1) of the Act, 21 U.S.C. section 360bbb-3(b)(1), unless the authorization is terminated or revoked sooner.   Troponin I (High Sensitivity)  Status: Abnormal   Collection Time: 03/30/19 11:30 AM  Result Value Ref Range   Troponin I (High Sensitivity) 24 (H) <18 ng/L    Comment: (NOTE) Elevated high sensitivity troponin I (hsTnI) values and significant  changes across serial measurements may suggest ACS but many other  chronic and acute conditions are known to elevate hsTnI results.  Refer to the "Links" section for chest pain algorithms and additional  guidance. Performed at [facility], [place] [address] [place], [place] [address]   POC occult blood, ED Provider will collect     Status: Abnormal   Collection Time: 03/30/19  1:10 PM  Result Value Ref Range   Fecal Occult Bld POSITIVE (A) NEGATIVE  Respiratory Panel by RT PCR (Flu A&B, Covid) - Nasopharyngeal Swab     Status: None   Collection Time: 03/30/19  1:35 PM   Specimen: Nasopharyngeal Swab  Result Value Ref Range   SARS Coronavirus 2 by RT PCR NEGATIVE NEGATIVE    Comment: (NOTE) SARS-CoV-2 target nucleic acids are NOT DETECTED. The SARS-CoV-2 RNA is generally detectable in upper respiratoy specimens during the acute phase of infection. The lowest concentration of SARS-CoV-2 viral copies this assay can detect is 131 copies/mL. A negative result does not preclude SARS-Cov-2 infection and should not be used as the sole basis for treatment or other patient management decisions. A negative result may occur with  improper specimen collection/handling, submission of specimen other than nasopharyngeal swab, presence of viral mutation(s) within the areas targeted by this  assay, and inadequate number of viral copies (<131 copies/mL). A negative result must be combined with clinical observations, patient history, and epidemiological information. The expected result is Negative. Fact Sheet for Patients:  PinkCheek.be Fact Sheet for Healthcare Providers:  GravelBags.it This test is not yet ap proved or cleared by the [place] FDA and  has been authorized for detection and/or diagnosis of SARS-CoV-2 by FDA under an Emergency Use Authorization (EUA). This EUA will remain  in effect (meaning this test can be used) for the duration of the COVID-19 declaration under Section 564(b)(1) of the Act, 21 U.S.C. section 360bbb-3(b)(1), unless the authorization is terminated or revoked sooner.    Influenza A by PCR NEGATIVE NEGATIVE   Influenza B by PCR NEGATIVE NEGATIVE    Comment: (NOTE) The Xpert Xpress SARS-CoV-2/FLU/RSV assay is intended as an aid in  the diagnosis of influenza from Nasopharyngeal swab specimens and  should not be used as a sole basis for treatment. Nasal washings and  aspirates are unacceptable for Xpert Xpress SARS-CoV-2/FLU/RSV  testing. Fact Sheet for Patients: PinkCheek.be Fact Sheet for Healthcare Providers: GravelBags.it This test is not yet approved or cleared by the [place] FDA and  has been authorized for detection and/or diagnosis of SARS-CoV-2 by  FDA under an Emergency Use Authorization (EUA). This EUA will remain  in effect (meaning this test can be used) for the duration of the  Covid-19 declaration under Section 564(b)(1) of the Act, 21  U.S.C. section 360bbb-3(b)(1), unless the authorization is  terminated or revoked. Performed at [facility], [place] [address], [place], [place] [address]    DG Chest Portable 1 View  Result Date: 03/30/2019 CLINICAL DATA:  Shortness of breath, hypoxia EXAM:  PORTABLE CHEST 1 VIEW COMPARISON:  Chest radiograph dated 07/28/2011 FINDINGS: The heart is enlarged. Vascular calcifications are seen in the aortic arch. Median sternotomy wires and an aortic valve prosthesis are redemonstrated. The lungs are clear. There is no  pleural effusion or pneumothorax. Degenerative changes are seen in the spine. IMPRESSION: Cardiomegaly.  No acute cardiopulmonary findings. Aortic Atherosclerosis (ICD10-I70.0). Electronically Signed   By: Zerita Boers M.D.   On: 03/30/2019 10:34    Pending Labs Unresulted Labs (From admission, onward)    Start     Ordered   03/31/19 0998  Basic metabolic panel  Daily,   R     03/30/19 1509   03/30/19 1852  Hemoglobin and hematocrit, blood  Once,   STAT     03/30/19 1851   03/30/19 1600  CBC WITH DIFFERENTIAL  Once-Timed,   STAT     03/30/19 1509   03/30/19 1508  Type and screen [place]  Once,   STAT    Comments: [place]    03/30/19 1509   03/30/19 1502  HIV Antibody (routine testing w rflx)  (HIV Antibody (Routine testing w reflex) panel)  Once,   STAT     03/30/19 1509          Vitals/Pain Today's Vitals   03/30/19 1132 03/30/19 1336 03/30/19 1900 03/30/19 1915  BP: (!) 114/52  (!) 121/55 121/65  Pulse: 69 79 70 73  Resp: 17  (!) 23 (!) 24  Temp: 97.8 F (36.6 C)     TempSrc: Oral     SpO2: 98% (!) 87% 97% 98%  PainSc:        Isolation Precautions Airborne and Contact precautions  Medications Medications  sodium chloride flush (NS) 0.9 % injection 3 mL (3 mLs Intravenous Not Given 03/30/19 1705)  pantoprazole (PROTONIX) EC tablet 40 mg (has no administration in time range)  sodium chloride flush (NS) 0.9 % injection 3 mL (3 mLs Intravenous Not Given 03/30/19 1900)  sodium chloride flush (NS) 0.9 % injection 3 mL (has no administration in time range)  0.9 %  sodium chloride infusion (has no administration in time range)  acetaminophen (TYLENOL) tablet 650 mg (has no  administration in time range)  ondansetron (ZOFRAN) injection 4 mg (has no administration in time range)  ipratropium-albuterol (DUONEB) 0.5-2.5 (3) MG/3ML nebulizer solution 3 mL (has no administration in time range)  nicotine (NICODERM CQ - dosed in mg/24 hours) patch 21 mg (has no administration in time range)  furosemide (LASIX) injection 20 mg (has no administration in time range)  metoprolol succinate (TOPROL-XL) 24 hr tablet 25 mg (has no administration in time range)  amLODipine (NORVASC) tablet 5 mg (has no administration in time range)  dextromethorphan (DELSYM) 30 MG/5ML liquid 30 mg (has no administration in time range)    Mobility walks Low fall risk   Focused Assessments Cardiac Assessment Handoff:    Lab Results  Component Value Date   CKTOTAL 133 07/13/2011   CKMB 3.6 07/13/2011   TROPONINI 0.38 ([place]) 07/13/2011   No results found for: DDIMER Does the Patient currently have chest pain? No     R Recommendations: See Admitting Provider Note  Report given to:   Additional Notes:

## 2019-03-30 NOTE — ED Provider Notes (Signed)
[facility] Provider Note   CSN: [id number] Arrival date & time: 03/30/19  0857     History Chief Complaint  Patient presents with  . Shortness of Breath    [facility] is a 69 y.o. female.  HPI  Patient presents today for progressive shortness of breath over the past 4 days.  States that she has noticed that she has decreased exercise tolerance but denies shortness of breath at rest until today.  Patient has any chest pain, nausea, vomiting, fever, chills, loss of taste or smell.  Patient does endorse some mild intermittent muscle cramps.  Does endorse moderate cough has been ongoing for months.  States it is nonproductive.  Also nonpainful.  Denies any hemoptysis.  States that she takes her blood thinner as prescribed.  Patient is currently on metoprolol that she states she takes for blood pressure control.  States she has no history of heart failure her last echo was done 2 years ago.  She follows with Dr. Stanford Breed of cardiology.  Patient states no chest pain currently and on with exertion however states that occasionally when she lifts things up or bends over she will have some cramps in her right arm and chest.  Patient has remote history of PE and A. fib after surgery is on Eliquis which she states she takes as prescribed.     Past Medical History:  Diagnosis Date  . Aortic stenosis    a. echo 1/13: normal EF, severe AS, mean gradient 50 mmHg => s/p AVR 4/13;   b.Echo 07/14/11: Mod LVH, EF 55-60%, grade 2 diast dysfxn, AVR mean gradient 21 mmHg, mod to severe LAE, mild RVE, mod RAE, PASP 32   . Arthritis   . Atrial fibrillation ([place])    Post operative following AVR  . Bell's palsy 2005  . 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 GERD (gastroesophageal reflux disease)   . HTN (hypertension), benign   . Hx of cardiac cath    [facility] 4/13:  no CAD, severe AS  . Hypercholesteremia   . Obesity (BMI 30.0-34.9) 06/29/2011  .  Pseudoaneurysm of left ventricle of heart   . Pulmonary embolus (HCC)    following AVR  . S/P aortic valve replacement with bioprosthetic valve 07/05/2011   59mm [facility] Ease pericardial tissue valve    Patient Active Problem List   Diagnosis Date Noted  . Acute respiratory failure with hypoxia ([place]) 03/30/2019  . 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 Pseudoaneurysm of left ventricle of heart   . Bruit 11/24/2014  . Dizziness 03/06/2012  . Anemia 03/06/2012  . Sleep apnea 07/28/2011  . Pulmonary infiltrates 07/20/2011  . Pulmonary edema 07/17/2011  . Pleural effusion 07/17/2011  . Hypoxemia 07/17/2011  . Pneumonia, organism unspecified(486) 07/17/2011  . Fluid overload 07/06/2011  . Obesity (BMI 30.0-34.9) 06/29/2011  . Pure hypercholesterolemia 06/01/2008  . TOBACCO ABUSE 06/01/2008  . HYPERTENSION, BENIGN ESSENTIAL 06/01/2008  . AORTIC STENOSIS 06/01/2008  . DYSPNEA 06/01/2008    Past Surgical History:  Procedure Laterality Date  . AORTIC VALVE REPLACEMENT  07/05/2011   Procedure: AORTIC VALVE REPLACEMENT (AVR);  Surgeon: Rexene Alberts, MD;  Location: [place];  Service: Open Heart Surgery;  Laterality: N/A;  . CATARACT EXTRACTION     bil  . KNEE ARTHROSCOPY  lft  . PARS PLANA VITRECTOMY W/ REPAIR OF MACULAR HOLE       OB History  Gravida  1   Para  1   Term  1   Preterm      AB      Living  1     SAB      TAB      Ectopic      Multiple      Live Births              Family History  Problem Relation Age of Onset  . Stroke Father   . Heart attack Mother     Social History   Tobacco Use  . Smoking status: Former Smoker    Packs/day: 1.00    Years: 45.00    Pack years: 45.00    Types: Cigarettes    Quit date: 07/04/2011    Years since quitting: 7.7  . Smokeless tobacco: Never Used  Substance Use Topics  . Alcohol use: Yes    Alcohol/week: 1.0 standard drinks    Types: 1 Standard drinks or equivalent per week     Comment: rare  . Drug use: Not on file    Home Medications Prior to Admission medications   Medication Sig Start Date End Date Taking? Authorizing Provider  acetaminophen (TYLENOL) 500 MG tablet Take 500 mg by mouth every 6 (six) hours as needed for mild pain.   Yes [provider]  albuterol (PROVENTIL HFA;VENTOLIN HFA) 108 (90 Base) MCG/ACT inhaler Inhale 2 puffs into the lungs every 4 (four) hours as needed for wheezing or shortness of breath.   Yes [provider]  amLODipine (NORVASC) 5 MG tablet TAKE 1 TABLET BY MOUTH EVERY DAY Patient taking differently: Take 5 mg by mouth daily.  03/27/19  Yes Lelon Perla, MD  amoxicillin (AMOXIL) 500 MG capsule Take 4 tablets = 2 grams 30-60 minutes before dental procedures 02/19/18  Yes Crenshaw, Denice Bors, MD  apixaban (ELIQUIS) 5 MG TABS tablet Take 1 tablet (5 mg total) by mouth 2 (two) times daily. 03/05/19  Yes Lelon Perla, MD  dextromethorphan (DELSYM) 30 MG/5ML liquid Take 30 mg by mouth as needed for cough.   Yes [provider]  metoprolol succinate (TOPROL-XL) 25 MG 24 hr tablet TAKE 1 TABLET (25 MG TOTAL) BY MOUTH EVERY 12 (TWELVE) HOURS. 05/29/18  Yes Crenshaw, Denice Bors, MD  pantoprazole (PROTONIX) 40 MG tablet Take 1 tablet (40 mg total) by mouth 2 (two) times daily. For reflux Patient taking differently: Take 40 mg by mouth daily as needed (reflux). For reflux 02/19/18  Yes Crenshaw, Denice Bors, MD  SODIUM FLUORIDE 5000 PPM 1.1 % PSTE 1 application by Mouth Rinse route as directed.  03/10/19  Yes [provider]    Allergies    Nickel, Sulfa antibiotics, and Tape  Review of Systems   Review of Systems  Constitutional: Negative for chills and fever.  HENT: Negative for congestion.   Eyes: Negative for pain.  Respiratory: Positive for cough and shortness of breath.   Cardiovascular: Negative for chest pain and leg swelling.  Gastrointestinal: Negative for abdominal pain and vomiting.    Genitourinary: Negative for dysuria.  Musculoskeletal: Negative for myalgias.  Skin: Negative for rash.  Neurological: Negative for dizziness and headaches.    Physical Exam Updated Vital Signs BP (!) 114/52 (BP Location: Left Arm)   Pulse 79   Temp 97.8 F (36.6 C) (Oral)   Resp 17   LMP 04/29/1991   SpO2 (!) 87% Comment: while walking  Physical Exam Vitals and nursing note  reviewed.  Constitutional:      General: She is not in acute distress.    Appearance: She is not ill-appearing.  HENT:     Head: Normocephalic and atraumatic.     Nose: Nose normal.     Mouth/Throat:     Mouth: Mucous membranes are moist.  Eyes:     General: No scleral icterus. Cardiovascular:     Rate and Rhythm: Normal rate and regular rhythm.     Pulses: Normal pulses.     Heart sounds: Murmur present.     Comments: Soft holosystolic systolic murmur crescendo ejection endoconsistent with patient's aortic valve replacement history Pulmonary:     Effort: Pulmonary effort is normal. No respiratory distress.     Breath sounds: No wheezing.  Abdominal:     Palpations: Abdomen is soft.     Tenderness: There is no abdominal tenderness.  Musculoskeletal:     Cervical back: Normal range of motion. No tenderness.     Right lower leg: No edema.     Left lower leg: No edema.  Skin:    General: Skin is warm and dry.     Capillary Refill: Capillary refill takes less than 2 seconds.  Neurological:     Mental Status: She is alert. Mental status is at baseline.  Psychiatric:        Mood and Affect: Mood normal.        Behavior: Behavior normal.     ED Results / Procedures / Treatments   Labs (all labs ordered are listed, but only abnormal results are displayed) Labs Reviewed  BASIC METABOLIC PANEL - Abnormal; Notable for the following components:      Result Value   Glucose, Bld 113 (*)    Calcium 8.8 (*)    All other components within normal limits  CBC - Abnormal; Notable for the following  components:   RBC 3.40 (*)    Hemoglobin 9.6 (*)    HCT 31.5 (*)    All other components within normal limits  BRAIN NATRIURETIC PEPTIDE - Abnormal; Notable for the following components:   B Natriuretic Peptide 617.2 (*)    All other components within normal limits  POC OCCULT BLOOD, ED - Abnormal; Notable for the following components:   Fecal Occult Bld POSITIVE (*)    All other components within normal limits  TROPONIN I (HIGH SENSITIVITY) - Abnormal; Notable for the following components:   Troponin I (High Sensitivity) 20 (*)    All other components within normal limits  TROPONIN I (HIGH SENSITIVITY) - Abnormal; Notable for the following components:   Troponin I (High Sensitivity) 24 (*)    All other components within normal limits  RESPIRATORY PANEL BY RT PCR (FLU A&B, COVID)  HIV ANTIBODY (ROUTINE TESTING W REFLEX)  CBC WITH DIFFERENTIAL/PLATELET  POC SARS CORONAVIRUS 2 AG -  ED  TYPE AND SCREEN    EKG EKG Interpretation  Date/Time:  Sunday March 30 2019 09:01:16 EST Ventricular Rate:  80 PR Interval:  130 QRS Duration: 92 QT Interval:  368 QTC Calculation: 424 R Axis:   101 Text Interpretation: Normal sinus rhythm Rightward axis Nonspecific ST and T wave abnormality Abnormal ECG no acute changes from prior Confirmed by Madalyn Rob [phone]) on 03/30/2019 9:54:19 AM   Radiology DG Chest Portable 1 View  Result Date: 03/30/2019 CLINICAL DATA:  Shortness of breath, hypoxia EXAM: PORTABLE CHEST 1 VIEW COMPARISON:  Chest radiograph dated 07/28/2011 FINDINGS: The heart is enlarged. Vascular calcifications are seen  in the aortic arch. Median sternotomy wires and an aortic valve prosthesis are redemonstrated. The lungs are clear. There is no pleural effusion or pneumothorax. Degenerative changes are seen in the spine. IMPRESSION: Cardiomegaly.  No acute cardiopulmonary findings. Aortic Atherosclerosis (ICD10-I70.0). Electronically Signed   By: Zerita Boers M.D.   On:  03/30/2019 10:34    Procedures Procedures (including critical care time) CRITICAL CARE Performed by: Tedd Sias   Total critical care time: 35 minutes  Critical care time was exclusive of separately billable procedures and treating other patients.  Critical care was necessary to treat or prevent imminent or life-threatening deterioration.  Critical care was time spent personally by me on the following activities: development of treatment plan with patient and/or surrogate as well as nursing, discussions with consultants, evaluation of patient's response to treatment, examination of patient, obtaining history from patient or surrogate, ordering and performing treatments and interventions, ordering and review of laboratory studies, ordering and review of radiographic studies, pulse oximetry and re-evaluation of patient's condition.    Medications Ordered in ED Medications  sodium chloride flush (NS) 0.9 % injection 3 mL (has no administration in time range)  pantoprazole (PROTONIX) EC tablet 40 mg (has no administration in time range)  sodium chloride flush (NS) 0.9 % injection 3 mL (has no administration in time range)  sodium chloride flush (NS) 0.9 % injection 3 mL (has no administration in time range)  0.9 %  sodium chloride infusion (has no administration in time range)  acetaminophen (TYLENOL) tablet 650 mg (has no administration in time range)  ondansetron (ZOFRAN) injection 4 mg (has no administration in time range)  ipratropium-albuterol (DUONEB) 0.5-2.5 (3) MG/3ML nebulizer solution 3 mL (has no administration in time range)    ED Course  I have reviewed the triage vital signs and the nursing notes.  Pertinent labs & imaging results that were available during my care of the patient were reviewed by me and considered in my medical decision making (see chart for details).  Clinical Course as of Mar 29 1626  Sun Mar 30, 2019  1330 Fecal occult positive.  Brown stool per  rectum on DRE.  POC occult blood, ED Provider will collect(!) [WF]    Clinical Course User Index [WF] Tedd Sias, [place]   MDM Rules/Calculators/A&P                      Patient is 69 year old female presented today with decreased exercise tolerance and shortness of breath over the past 3 to 4 days.  She denies any chest pain, nausea, diaphoresis, abdominal pain or dark tarry stools.  She is on blood thinners for A. fib and is rate controlled and has no history of CHF however on presentation she was found to be in low 80% O2 sat.  She is well-appearing on exam with no increased work of breathing however she does become more tachypneic when oxygen is turned off and her sats again dropped.  Patient has a history of aortic valve repair and history of A. fib.  Her last echo was 2 years ago with preserved ejection fraction she is on metoprolol for rate control for her A. fib.  Considering her history and presentation my suspicion for new onset heart failure.  She does not appear fluid overloaded on my exam with no peripheral edema no JVD on my evaluation.  Difficult to assess for S3 as patient has prominent holosystolic crescendo decrescendo murmur s/p aortic valve replacement.  Chest x-ray is without any pulmonary edema or infiltrate or acute abnormality.  Troponins flat at approximately 24.  She is anemic hemoglobin 9.6 last hemoglobin 1 month ago was within normal limits at approximately 12.  She denies any melena or hematochezia however she does have positive fecal occult with brown stool on my exam.  Suspect slow GI bleed possibly related to her anticoagulation.  BNP elevated at 617.  COVID negative.   Patient is well-appearing and I do not have a clear aspiration for her symptoms.  She is not wheezing so I have low suspicion for asthma/COPD.  Patient is Covid negative by and test as well as PCR.  Doubt PE as patient is on chronic anticoagulation.  2:56 PM --discussed case with neurology and  teaching service.  Dr. Harvest Forest who will accept patient into hospital.   I discussed this case with my attending physician who cosigned this note including patient's presenting symptoms, physical exam, and planned diagnostics and interventions. Attending physician stated agreement with plan or made changes to plan which were implemented.   Attending physician assessed patient at bedside.    Final Clinical Impression(s) / ED Diagnoses Final diagnoses:  Shortness of breath  Hypoxia  Acute respiratory failure with hypoxia [facility])    Rx / DC Orders ED Discharge Orders    None        Tedd Sias, [place] 03/30/19 1631    Lucrezia Starch, MD 03/31/19 [phone]

## 2019-03-30 NOTE — ED Notes (Signed)
C/o being sob  For probably several weeks states she has been just thinking it would get better. 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 Denies chest pain just sob

## 2019-03-31 ENCOUNTER — Encounter (HOSPITAL_COMMUNITY)
Admission: EM | Disposition: A | Discharge status: Home / Self Care | Payer: Self-pay | Source: Home / Self Care | Attending: Internal Medicine

## 2019-03-31 ENCOUNTER — Telehealth: Payer: Self-pay | Admitting: Cardiology

## 2019-03-31 ENCOUNTER — Inpatient Hospital Stay (HOSPITAL_COMMUNITY): Payer: Medicare Other | Admitting: Certified Registered Nurse Anesthetist

## 2019-03-31 DIAGNOSIS — D62 Acute posthemorrhagic anemia: Secondary | ICD-10-CM

## 2019-03-31 DIAGNOSIS — K259 Gastric ulcer, unspecified as acute or chronic, without hemorrhage or perforation: Secondary | ICD-10-CM

## 2019-03-31 DIAGNOSIS — K250 Acute gastric ulcer with hemorrhage: Secondary | ICD-10-CM

## 2019-03-31 DIAGNOSIS — K222 Esophageal obstruction: Secondary | ICD-10-CM

## 2019-03-31 DIAGNOSIS — R195 Other fecal abnormalities: Secondary | ICD-10-CM

## 2019-03-31 DIAGNOSIS — K253 Acute gastric ulcer without hemorrhage or perforation: Secondary | ICD-10-CM

## 2019-03-31 HISTORY — PX: ESOPHAGOGASTRODUODENOSCOPY (EGD) WITH PROPOFOL: SHX5813

## 2019-03-31 LAB — BASIC METABOLIC PANEL
Anion gap: 9 (ref 5–15)
BUN: 12 mg/dL (ref 8–23)
CO2: 30 mmol/L (ref 22–32)
Calcium: 8.8 mg/dL — ABNORMAL LOW (ref 8.9–10.3)
Chloride: 100 mmol/L (ref 98–111)
Creatinine, Ser: 0.88 mg/dL (ref 0.44–1.00)
GFR calc Af Amer: >60 mL/min (ref >60)
GFR calc non Af Amer: >60 mL/min (ref >60)
Glucose, Bld: 98 mg/dL (ref 70–99)
Potassium: 4.2 mmol/L (ref 3.5–5.1)
Sodium: 139 mmol/L (ref 135–145)

## 2019-03-31 LAB — CBC
HCT: 30.3 % — ABNORMAL LOW (ref 36.0–46.0)
Hemoglobin: 9.2 g/dL — ABNORMAL LOW (ref 12.0–15.0)
MCH: 27.5 pg (ref 26.0–34.0)
MCHC: 30.4 g/dL (ref 30.0–36.0)
MCV: 90.7 fL (ref 80.0–100.0)
Platelets: 291 10*3/uL (ref 150–400)
RBC: 3.34 MIL/uL — ABNORMAL LOW (ref 3.87–5.11)
RDW: 14.3 % (ref 11.5–15.5)
WBC: 8.4 10*3/uL (ref 4.0–10.5)
nRBC: 0 % (ref 0.0–0.2)

## 2019-03-31 SURGERY — ESOPHAGOGASTRODUODENOSCOPY (EGD) WITH PROPOFOL
Anesthesia: Monitor Anesthesia Care

## 2019-03-31 MED ORDER — IPRATROPIUM-ALBUTEROL 0.5-2.5 (3) MG/3ML IN SOLN: 3.0000 mL | RESPIRATORY_TRACT | Status: DC | PRN

## 2019-03-31 MED ORDER — LACTATED RINGERS IV SOLN: INTRAVENOUS | Status: DC

## 2019-03-31 MED ORDER — PROPOFOL 10 MG/ML IV BOLUS
INTRAVENOUS | Status: DC | PRN
  Administered 2019-03-31: 30 mg via INTRAVENOUS
  Administered 2019-03-31: 20 mg via INTRAVENOUS

## 2019-03-31 MED ORDER — DEXMEDETOMIDINE HCL 200 MCG/2ML IV SOLN
INTRAVENOUS | Status: DC | PRN
  Administered 2019-03-31 (×2): 8 ug via INTRAVENOUS

## 2019-03-31 MED ORDER — SODIUM CHLORIDE 0.9 % IV SOLN: INTRAVENOUS | Status: DC

## 2019-03-31 MED ORDER — PHENYLEPHRINE HCL-NACL 10-0.9 MG/250ML-% IV SOLN
INTRAVENOUS | Status: DC | PRN
  Administered 2019-03-31: 25 ug/min via INTRAVENOUS

## 2019-03-31 MED ORDER — PROPOFOL 500 MG/50ML IV EMUL
INTRAVENOUS | Status: DC | PRN
  Administered 2019-03-31: 50 ug/kg/min via INTRAVENOUS

## 2019-03-31 MED ORDER — SODIUM CHLORIDE 0.9 % IV SOLN: 250.0000 mL | INTRAVENOUS | Status: DC | PRN

## 2019-03-31 MED ORDER — SODIUM CHLORIDE 0.9% FLUSH
3.0000 mL | Freq: Two times a day (BID) | INTRAVENOUS | Status: DC
  Administered 2019-03-31: 3 mL via INTRAVENOUS

## 2019-03-31 MED ORDER — SODIUM CHLORIDE 0.9% FLUSH: 3.0000 mL | INTRAVENOUS | Status: DC | PRN

## 2019-03-31 MED ORDER — SODIUM CHLORIDE 0.9 % IV SOLN: INTRAVENOUS | Status: DC | PRN

## 2019-03-31 SURGICAL SUPPLY — 15 items

## 2019-03-31 NOTE — Anesthesia Preprocedure Evaluation (Addendum)
Anesthesia Evaluation  Patient identified by MRN, date of birth, ID band Patient awake    Reviewed: Allergy & Precautions, NPO status , Patient's Chart, lab work & pertinent test results  History of Anesthesia Complications Negative for: history of anesthetic complications  Airway Mallampati: III  TM Distance: >3 FB Neck ROM: Full    Dental  (+) Dental Advisory Given, Teeth Intact   Pulmonary shortness of breath and with exertion, sleep apnea , former smoker, PE   Pulmonary exam normal        Cardiovascular hypertension, Pt. on home beta blockers and Pt. on medications + dysrhythmias Atrial Fibrillation + Valvular Problems/Murmurs (s/p AVR, now with severe bioprosthetic stenosis) AS  Rhythm:Regular Rate:Normal + Systolic murmurs  '21 TTE - EF 60 to 65%. Mildly increased LVH. Grade II diastolic dysfunction (pseudonormalization). Elevated left atrial pressure. LA was severely dilated. RA was mildly dilated. Mild MR and TR. The tricuspid regurgitant velocity is 3.07 m/s, and with an assumed right atrial pressure of 15 mmHg, the estimated right ventricular systolic pressure is moderately elevated at 52.7 mmHg. 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 S/p Bioprosthetic aortic valve replacement. Severe bioprosthetic stenosis (Vmax 4.9 m/s, MG 60 mmHg, EOA 0.5 cm^2, DVI 0.27, AT 148 ms).     Neuro/Psych negative neurological ROS  negative psych ROS   GI/Hepatic Neg liver ROS, GERD  Medicated and Controlled,  Endo/Other   Obesity   Renal/GU negative Renal ROS     Musculoskeletal  (+) Arthritis ,   Abdominal   Peds  Hematology  (+) anemia ,   Anesthesia Other Findings   Reproductive/Obstetrics                            Anesthesia Physical Anesthesia Plan  ASA: IV  Anesthesia Plan: MAC   Post-op Pain Management:    Induction: Intravenous  PONV Risk Score and Plan: 2  and Propofol infusion and Treatment may vary due to age or medical condition  Airway Management Planned: Nasal Cannula and Natural Airway  Additional Equipment: Arterial line  Intra-op Plan:   Post-operative Plan:   Informed Consent: I have reviewed the patients History and Physical, chart, labs and discussed the procedure including the risks, benefits and alternatives for the proposed anesthesia with the patient or authorized representative who has indicated his/her understanding and acceptance.       Plan Discussed with: CRNA and Anesthesiologist  Anesthesia Plan Comments:        Anesthesia Quick Evaluation

## 2019-03-31 NOTE — Procedures (Addendum)
Discussed with the patient after recovering from anesthesia and with the patient's husband, Clair Gulling, by phone after the procedure

## 2019-03-31 NOTE — Consult Note (Signed)
[facility] Consult: 8:23 AM 03/31/2019  LOS: 1 day    Referring Provider: Dr Broadus John  Primary Care Physician:  Asencion Noble, MD Primary Gastroenterologist:  Althia Forts.  Had EGD with Dr. Oletta Lamas 30+ years ago and [facility] does not have records of this in their current system.    Reason for Consultation: Anemia.  FOBT positive.  Dark stools.   HPI: Terri Edwards is a 69 y.o. female.  Past medical history aortic stenosis. s/p pericardial AVR.  atrial fibrillation.  On the first of 01/2019 she was switched from Coumadin to Eliquis.  Obesity.  Hypertension.  Hyperlipidemia. 03/11/2018 MRI abdomen showed 3 small cystic lesions in the pancreas present since 0000000 MRI and most certainly benign.  The smaller cysts slightly larger but still benign appearing recommend MR in 1 to 2 years for reassessment.  Stable bilateral adrenal adenomas.  Liver, gallbladder normal. Has always declined suggestion for screening colonoscopy. EGD 30+ years ago showed GERD. She takes Protonix (not often) and Tums as needed and has reflux several times weekly.  Infrequent Aleve, she is probably used 2 tablets in the last month.  No alcohol.  No history of liver problems or abnormal LFTs.  For  2 or so weeks she has noticed occasional looser, dark stools on 2 or 3 occcasions.  No nausea, vomiting, anorexia, weight loss, abdominal pain blood per rectum.  More short of breath with stair climbing or walking to get the mail which is new. Presented to the ED for the shortness of breath.  Was not having angina.  Hgb 9.6 >> 9.2, was 13.2 on 01/22/19, 12.7 on 02/24/19.  MCV 92. No renal insufficiency or azotemia. FOBT positive. COVID-19 negative     Past Medical History:  Diagnosis Date  . Aortic stenosis    a. echo 1/13: normal EF, severe AS,  mean gradient 50 mmHg => s/p AVR 4/13;   b.Echo 07/14/11: Mod LVH, EF 55-60%, grade 2 diast dysfxn, AVR mean gradient 21 mmHg, mod to severe LAE, mild RVE, mod RAE, PASP 32   . Arthritis   . Atrial fibrillation ([place])    Post operative following AVR  . Bell's palsy 2005  . 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 GERD (gastroesophageal reflux disease)   . HTN (hypertension), benign   . Hx of cardiac cath    [facility] 4/13:  no CAD, severe AS  . Hypercholesteremia   . Obesity (BMI 30.0-34.9) 06/29/2011  . Pseudoaneurysm of left ventricle of heart   . Pulmonary embolus (H[facility])    following AVR  . S/P aortic valve replacement with bioprosthetic valve 07/05/2011   18mm Edwards Magna Ease pericardial tissue valve    Past Surgical History:  Procedure Laterality Date  . AORTIC VALVE REPLACEMENT  07/05/2011   Procedure: AORTIC VALVE REPLACEMENT (AVR);  Surgeon: Rexene Alberts, MD;  Location: [facility];  Service: Open Heart Surgery;  Laterality: N/A;  . CATARACT EXTRACTION     bil  . KNEE ARTHROSCOPY  lft  . PARS PLANA VITRECTOMY W/ REPAIR OF  MACULAR HOLE      Prior to Admission medications   Medication Sig Start Date End Date Taking? Authorizing Provider  acetaminophen (TYLENOL) 500 MG tablet Take 500 mg by mouth every 6 (six) hours as needed for mild pain.   Yes [provider]  albuterol (PROVENTIL HFA;VENTOLIN HFA) 108 (90 Base) MCG/ACT inhaler Inhale 2 puffs into the lungs every 4 (four) hours as needed for wheezing or shortness of breath.   Yes [provider]  amLODipine (NORVASC) 5 MG tablet TAKE 1 TABLET BY MOUTH EVERY DAY Patient taking differently: Take 5 mg by mouth daily.  03/27/19  Yes Lelon Perla, MD  amoxicillin (AMOXIL) 500 MG capsule Take 4 tablets = 2 grams 30-60 minutes before dental procedures 02/19/18  Yes Crenshaw, Denice Bors, MD  apixaban (ELIQUIS) 5 MG TABS tablet Take 1 tablet (5 mg total) by mouth 2 (two) times daily. 03/05/19  Yes Lelon Perla, MD    dextromethorphan (DELSYM) 30 MG/5ML liquid Take 30 mg by mouth as needed for cough.   Yes [provider]  metoprolol succinate (TOPROL-XL) 25 MG 24 hr tablet TAKE 1 TABLET (25 MG TOTAL) BY MOUTH EVERY 12 (TWELVE) HOURS. 05/29/18  Yes Crenshaw, Denice Bors, MD  pantoprazole (PROTONIX) 40 MG tablet Take 1 tablet (40 mg total) by mouth 2 (two) times daily. For reflux Patient taking differently: Take 40 mg by mouth daily as needed (reflux). For reflux 02/19/18  Yes Crenshaw, Denice Bors, MD  SODIUM FLUORIDE 5000 PPM 1.1 % PSTE 1 application by Mouth Rinse route as directed.  03/10/19  Yes [provider]    Scheduled Meds: . amLODipine  5 mg Oral Daily  . Chlorhexidine Gluconate Cloth  6 each Topical Daily  . ipratropium-albuterol  3 mL Nebulization QID  . metoprolol succinate  25 mg Oral Q12H  . nicotine  21 mg Transdermal Daily  . pantoprazole  40 mg Oral BID  . sodium chloride flush  3 mL Intravenous Once  . sodium chloride flush  3 mL Intravenous Q12H   Infusions: . sodium chloride     PRN Meds: sodium chloride, acetaminophen, dextromethorphan, ondansetron (ZOFRAN) IV, sodium chloride flush   Allergies as of 03/30/2019 - Review Complete 03/30/2019  Allergen Reaction Noted  . Nickel  02/07/2011  . Sulfa antibiotics  02/07/2011  . Tape  06/29/2011    Family History  Problem Relation Age of Onset  . Stroke Father   . Heart attack Mother     Social History   Socioeconomic History  . Marital status: Married    Spouse name: Not on file  . Number of children: Not on file  . Years of education: Not on file  . Highest education level: Not on file  Occupational History  . Occupation: hospice  Tobacco Use  . Smoking status: Former Smoker    Packs/day: 1.00    Years: 45.00    Pack years: 45.00    Types: Cigarettes    Quit date: 07/04/2011    Years since quitting: 7.7  . Smokeless tobacco: Never Used  Substance and Sexual Activity  . Alcohol use: Yes     Alcohol/week: 1.0 standard drinks    Types: 1 Standard drinks or equivalent per week    Comment: rare  . Drug use: Not on file  . Sexual activity: Yes    Birth control/protection: Post-menopausal  Other Topics Concern  . Not on file  Social History Narrative  . Not on file  Social Determinants of Health   Financial Resource Strain:   . Difficulty of Paying Living Expenses: Not on file  Food Insecurity:   . Worried About Charity fundraiser in the Last Year: Not on file  . Ran Out of Food in the Last Year: Not on file  Transportation Needs:   . Lack of Transportation (Medical): Not on file  . Lack of Transportation (Non-Medical): Not on file  Physical Activity:   . Days of Exercise per Week: Not on file  . Minutes of Exercise per Session: Not on file  Stress:   . Feeling of Stress : Not on file  Social Connections:   . Frequency of Communication with Friends and Family: Not on file  . Frequency of Social Gatherings with Friends and Family: Not on file  . Attends Religious Services: Not on file  . Active Member of Clubs or Organizations: Not on file  . Attends Archivist Meetings: Not on file  . Marital Status: Not on file  Intimate Partner Violence:   . Fear of Current or Ex-Partner: Not on file  . Emotionally Abused: Not on file  . Physically Abused: Not on file  . Sexually Abused: Not on file    REVIEW OF SYSTEMS: Constitutional: No fatigue, gets tired along with the shortness of breath with stairs or longer walking distances. ENT:  No nose bleeds Pulm: Per HPI.  No cough. CV:  No palpitations, no LE edema.  No chest pressure/angina. GU:  No hematuria, no frequency GI: See HPI. Heme: Denies unusual bleeding or bruising. Transfusions: No previous blood transfusions. Neuro:  No headaches, no peripheral tingling or numbness.  No syncope.  No dizziness.  No tremors. Derm:  No itching, no rash or sores.  Endocrine:  No sweats or chills.  No polyuria or  dysuria Immunization: Reviewed    PHYSICAL EXAM: Vital signs in last 24 hours: Vitals:   03/31/19 0647 03/31/19 0731  BP: (!) 113/58   Pulse: 66   Resp:    Temp: 98.2 F (36.8 C)   SpO2: 97% 97%   Wt Readings from Last 3 Encounters:  03/30/19 77.5 kg  04/15/18 72.1 kg  02/19/18 71.9 kg    General: Nonill appearing.  Overweight. Head: No facial asymmetry or swelling.  No signs of head trauma. Eyes: No scleral icterus.  No conjunctival pallor. Ears: Not hard of hearing Nose: No congestion, no discharge Mouth: Tongue midline.  Tongue pink, moist, clear.  Fair dentition. Neck: No JVD, no masses, no thyromegaly. Lungs: Clear bilaterally.  No labored breathing, no cough.  Heart: RRR.  No MRG.  S1, S2 present Abdomen: Soft.  Not tender, not distended.  No HSM, masses, bruits, hernias.   Rectal: Deferred Musc/Skeltl: No joint redness, swelling or gross deformity. Extremities: No CCE. Neurologic: Oriented x3.  Moves all 4 limbs.  No tremors, no gross weakness or deficits. Skin: No rash, no sores, no telangiectasia. Tattoos: None. Nodes: No cervical adenopathy Psych: Cooperative, fluid speech.  Intake/Output from previous day: 01/10 0701 - 01/11 0700 In: -  Out: 1000 [Urine:1000] Intake/Output this shift: No intake/output data recorded.  LAB RESULTS: Recent Labs    03/30/19 0900 03/30/19 1600  WBC 9.6 9.4  HGB 9.6* 9.2*  HCT 31.5* 30.2*  PLT 311 297   BMET Lab Results  Component Value Date   NA 139 03/31/2019   NA 135 03/30/2019   NA 142 02/24/2019   K 4.2 03/31/2019   K 4.7 03/30/2019  K 4.1 02/24/2019   CL 100 03/31/2019   CL 101 03/30/2019   CL 104 02/24/2019   CO2 30 03/31/2019   CO2 27 03/30/2019   CO2 32 02/24/2019   GLUCOSE 98 03/31/2019   GLUCOSE 113 (H) 03/30/2019   GLUCOSE 103 02/24/2019   BUN 12 03/31/2019   BUN 13 03/30/2019   BUN 15 02/24/2019   CREATININE 0.88 03/31/2019   CREATININE 0.82 03/30/2019   CREATININE 0.83 02/24/2019    CALCIUM 8.8 (L) 03/31/2019   CALCIUM 8.8 (L) 03/30/2019   CALCIUM 9.3 02/24/2019   LFT No results for input(s): PROT, ALBUMIN, AST, ALT, ALKPHOS, BILITOT, BILIDIR, IBILI in the last 72 hours. PT/INR Lab Results  Component Value Date   INR 2.8 01/22/2019   INR 2.7 12/03/2018   INR 2.6 10/22/2018   Hepatitis Panel No results for input(s): HEPBSAG, HCVAB, HEPAIGM, HEPBIGM in the last 72 hours. C-Diff No components found for: CDIFF Lipase  No results found for: LIPASE  Drugs of Abuse  No results found for: LABOPIA, COCAINSCRNUR, LABBENZ, AMPHETMU, THCU, LABBARB   RADIOLOGY STUDIES: DG Chest Portable 1 View  Result Date: 03/30/2019 CLINICAL DATA:  Shortness of breath, hypoxia EXAM: PORTABLE CHEST 1 VIEW COMPARISON:  Chest radiograph dated 07/28/2011 FINDINGS: The heart is enlarged. Vascular calcifications are seen in the aortic arch. Median sternotomy wires and an aortic valve prosthesis are redemonstrated. The lungs are clear. There is no pleural effusion or pneumothorax. Degenerative changes are seen in the spine. IMPRESSION: Cardiomegaly.  No acute cardiopulmonary findings. Aortic Atherosclerosis (ICD10-I70.0). Electronically Signed   By: Zerita Boers M.D.   On: 03/30/2019 10:34   ECHOCARDIOGRAM COMPLETE  Result Date: 03/30/2019   ECHOCARDIOGRAM REPORT   Patient Name:   JUNG BURBACK Date of Exam: 03/30/2019 Medical Rec #:  [id number]          Height:       62.0 in Accession #:    [id number]         Weight:       159.0 lb Date of Birth:  1950-10-16          BSA:          1.73 m Patient Age:    63 years           BP:           114/52 mmHg Patient Gender: F                  HR:           74 bpm. Exam Location:  Inpatient Procedure: 2D Echo Indications:    cardiomegaly 429.3  History:        Patient has prior history of Echocardiogram examinations, most                 recent 02/05/2017. Signs/Symptoms:Dyspnea; Risk                 Factors:Hypertension, Current Smoker and Sleep Apnea.  Aortic                 Valve: A  Sonographer:    Johny Chess Referring Phys: [id number] RONDELL A SMITH IMPRESSIONS  1. Left ventricular ejection fraction, by visual estimation, is 60 to 65%. The left ventricle has normal function. There is mildly increased left ventricular hypertrophy.  2. Left ventricular diastolic parameters are consistent with Grade II diastolic dysfunction (pseudonormalization).  3. Elevated left atrial pressure.  4. Global right ventricle has normal systolic  function.The right ventricular size is normal.  5. Left atrial size was severely dilated.  6. Right atrial size was mildly dilated.  7. Moderate mitral annular calcification.  8. The mitral valve is abnormal. Mild mitral valve regurgitation.  9. The tricuspid valve is normal in structure. Tricuspid valve regurgitation is mild. 10. The pulmonic valve was not well visualized. Pulmonic valve regurgitation is not visualized. 11. The tricuspid regurgitant velocity is 3.07 m/s, and with an assumed right atrial pressure of 15 mmHg, the estimated right ventricular systolic pressure is moderately elevated at 52.7 mmHg. 12. The inferior vena cava is dilated in size with <50% respiratory variability, suggesting right atrial pressure of 15 mmHg. 13. 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 Consider gated CT to evaluate for stability 14. S/p Bioprosthetic aortic valve replacement. No regurgitation seen. Severe bioprosthetic stenosis (Vmax 4.9 m/s, MG 60 mmHg, EOA 0.5 cm^2, DVI 0.27, AT 148 ms). Compared to prior TTE on 02/05/17, there is a marked increase in mean gradients (31mmHg to  47mmHg). FINDINGS  Left Ventricle: Left ventricular ejection fraction, by visual estimation, is 60 to 65%. The left ventricle has normal function. The left ventricle has no regional wall motion abnormalities. There is mildly increased left ventricular hypertrophy. Left ventricular diastolic parameters are consistent with Grade II diastolic  dysfunction (pseudonormalization). Elevated left atrial pressure. Right Ventricle: The right ventricular size is normal. No increase in right ventricular wall thickness. Global RV systolic function is has normal systolic function. The tricuspid regurgitant velocity is 3.07 m/s, and with an assumed right atrial pressure  of 15 mmHg, the estimated right ventricular systolic pressure is moderately elevated at 52.7 mmHg. Left Atrium: Left atrial size was severely dilated. Right Atrium: Right atrial size was mildly dilated Pericardium: Trivial pericardial effusion is present. Mitral Valve: The mitral valve is abnormal. There is mild thickening of the mitral valve leaflet(s). Moderate mitral annular calcification. Mild mitral valve regurgitation. Tricuspid Valve: The tricuspid valve is normal in structure. Tricuspid valve regurgitation is mild. Aortic Valve: The aortic valve has been repaired/replaced. Aortic valve regurgitation is not visualized. Aortic valve mean gradient measures 50.0 mmHg. Aortic valve peak gradient measures 79.9 mmHg. Aortic valve area, by VTI measures 0.65 cm. Bioprosthetic aortic valve replacement. No regurgitation seen. Severe bioprosthetic stenosis (Vmax 4.9 m/s, MG 60 mmHg, EOA 0.7 cm^2, DVI 0.27). Pulmonic Valve: The pulmonic valve was not well visualized. Pulmonic valve regurgitation is not visualized. Pulmonic regurgitation is not visualized. Aorta: The aortic root is normal in size and structure. Venous: The inferior vena cava is dilated in size with less than 50% respiratory variability, suggesting right atrial pressure of 15 mmHg. IAS/Shunts: The atrial septum is grossly normal.  LEFT VENTRICLE PLAX 2D LVIDd:         5.10 cm  Diastology LVIDs:         3.60 cm  LV e' lateral:   8.81 cm/s LV PW:         1.00 cm  LV E/e' lateral: 15.3 LV IVS:        0.90 cm  LV e' medial:    7.94 cm/s LVOT diam:     1.50 cm  LV E/e' medial:  17.0 LV SV:         69 ml LV SV Index:   38.54 LVOT Area:     1.77  cm  RIGHT VENTRICLE RV S prime:     10.30 cm/s TAPSE (M-mode): 1.7 cm LEFT ATRIUM  Index       RIGHT ATRIUM           Index LA diam:        5.10 cm  2.94 cm/m  RA Area:     19.40 cm LA Vol (A2C):   97.1 ml  55.99 ml/m RA Volume:   52.10 ml  30.04 ml/m LA Vol (A4C):   101.0 ml 58.24 ml/m LA Biplane Vol: 101.0 ml 58.24 ml/m  AORTIC VALVE AV Area (Vmax):    0.68 cm AV Area (Vmean):   0.63 cm AV Area (VTI):     0.65 cm AV Vmax:           447.00 cm/s AV Vmean:          334.000 cm/s AV VTI:            1.150 m AV Peak Grad:      79.9 mmHg AV Mean Grad:      50.0 mmHg LVOT Vmax:         172.00 cm/s LVOT Vmean:        119.750 cm/s LVOT VTI:          0.424 m LVOT/AV VTI ratio: 0.37  AORTA Ao Root diam: 2.70 cm MITRAL VALVE                         TRICUSPID VALVE MV Area (PHT): 4.39 cm              TR Peak grad:   37.7 mmHg MV PHT:        50.17 msec            TR Vmax:        307.00 cm/s MV Decel Time: 173 msec MV E velocity: 135.00 cm/s 103 cm/s  SHUNTS MV A velocity: 78.50 cm/s  70.3 cm/s Systemic VTI:  0.42 m MV E/A ratio:  1.72        1.5       Systemic Diam: 1.50 cm  Oswaldo Milian MD Electronically signed by Oswaldo Milian MD Signature Date/Time: 03/30/2019/10:41:40 PM    Final       IMPRESSION:   *    Normocytic anemia.  FOBT positive.  Intermittent dark/tarry stools. Chronic Eliquis for A. Fib. No transfusions to date.  GERD on remote EGD.  No previous colonoscopy R/o ulcers, gastritis, AMMs.    *    Benign pancreatic cysts present since at least 2013.  *    Chronic Eliquis.  Last dose 1/9.  For A fib.  Hx bioprosthetic AVR.      PLAN:     *   EGD today.     Azucena Freed  03/31/2019, 8:23 AM Phone [phone]

## 2019-03-31 NOTE — Telephone Encounter (Signed)
Returned call to husband and informed that we can use this appt for hospital follow up appt since she will need to come in for hosp f/u verbalized understanding

## 2019-03-31 NOTE — Op Note (Signed)
[facility] Patient Name: Terri Edwards Procedure Date : 03/31/2019 MRN: [id number] Attending MD: Jerene Bears , MD Date of Birth: 1950-03-29 CSN: [id number] Age: 69 Admit Type: Inpatient Procedure:                Upper GI endoscopy Indications:              Acute post hemorrhagic anemia, Heme positive stool                            in setting of chronic anticoagulation Providers:                Lajuan Lines. Hilarie Fredrickson, MD, Angus Seller, William Dalton,                            Technician, Theodora Blow, Technician, Tressia Miners, CRNA Referring MD:             [facility] Medicines:                Monitored Anesthesia Care Complications:            No immediate complications. Estimated Blood Loss:     Estimated blood loss: none. Procedure:                Pre-Anesthesia Assessment:                           - Prior to the procedure, a History and Physical                            was performed, and patient medications and                            allergies were reviewed. The patient's tolerance of                            previous anesthesia was also reviewed. The risks                            and benefits of the procedure and the sedation                            options and risks were discussed with the patient.                            All questions were answered, and informed consent                            was obtained. Prior Anticoagulants: The patient has                            taken Eliquis (apixaban), last dose was 2 days  prior to procedure. ASA Grade Assessment: III - A                            patient with severe systemic disease. After                            reviewing the risks and benefits, the patient was                            deemed in satisfactory condition to undergo the                            procedure.                           After obtaining informed  consent, the endoscope was                            passed under direct vision. Throughout the                            procedure, the patient's blood pressure, pulse, and                            oxygen saturations were monitored continuously. The                            [id number]) Olympus gastroscope was                            introduced through the mouth, and advanced to the                            second part of duodenum. The upper GI endoscopy was                            accomplished without difficulty. The patient                            tolerated the procedure fairly well. Scope In: Scope Out: Findings:      A non-obstructing Schatzki ring was found at the gastroesophageal       junction.      A 2 cm hiatal hernia was present.      Normal mucosa was found in the entire esophagus.      One non-bleeding superficial gastric ulcer with no stigmata of bleeding       was found in the prepyloric region of the stomach. The lesion was 5 mm       in largest dimension. No evidence of active or recent bleeding in the       stomach.      The examined duodenum was normal, but exam of the duodenum was limited       and brief given as oxygen saturations declined necessitating termination       of the procedure. No evidence of active or recent bleeding in  the       examined duodenum. Impression:               - Non-obstructing Schatzki ring.                           - 2 cm hiatal hernia.                           - Normal mucosa was found in the entire esophagus.                           - Non-bleeding gastric ulcer with no stigmata of                            bleeding.                           - Normal examined duodenum as above.                           - Examination somewhat limited as above.                           - No specimens collected. Moderate Sedation:      N/A Recommendation:           - Return patient to hospital ward for ongoing care.                            - Advance diet as tolerated.                           - Check H. Pylori stool antigen.                           - Daily PPI.                           - Colonoscopy is recommended, but given severe                            aortic stenosis and patient's reluctance with                            colonoscopy at this time, I would recommend                            delaying this procedure until after possible TAVR.                           - Closely monitor Hgb.                           - Avoid NSAIDs.                           - Resuming anticoagulation deferred to cardiology,  but when doing so closely monitor for signs of                            overt bleeding and Hgb. Procedure Code(s):        --- Professional ---                           [phone], Esophagogastroduodenoscopy, flexible,                            transoral; diagnostic, including collection of                            specimen(s) by brushing or washing, when performed                            (separate procedure) Diagnosis Code(s):        --- Professional ---                           K22.2, Esophageal obstruction                           K44.9, Diaphragmatic hernia without obstruction or                            gangrene                           K25.9, Gastric ulcer, unspecified as acute or                            chronic, without hemorrhage or perforation                           D62, Acute posthemorrhagic anemia                           R19.5, Other fecal abnormalities CPT copyright 2019 American Medical Association. All rights reserved. The codes documented in this report are preliminary and upon coder review may  be revised to meet current compliance requirements. Jerene Bears, MD 03/31/2019 2:44:29 PM This report has been signed electronically. Number of Addenda: 0

## 2019-03-31 NOTE — Transfer of Care (Signed)
Immediate Anesthesia Transfer of Care Note  Patient: Terri Edwards  Procedure(s) Performed: ESOPHAGOGASTRODUODENOSCOPY (EGD) WITH PROPOFOL (N/A )  Patient Location: Endoscopy Unit  Anesthesia Type:MAC  Level of Consciousness: awake, alert , oriented, patient cooperative and responds to stimulation  Airway & Oxygen Therapy: Patient Spontanous Breathing and Patient connected to nasal cannula oxygen  Post-op Assessment: Report given to RN and Post -op Vital signs reviewed and stable  Post vital signs: Reviewed and stable  Last Vitals:  Vitals Value Taken Time  BP 111/38 03/31/19 1423  Temp 36.5 C 03/31/19 1423  Pulse 69 03/31/19 1427  Resp 25 03/31/19 1427  SpO2 98 % 03/31/19 1427  Vitals shown include unvalidated device data.  Last Pain:  Vitals:   03/31/19 1423  TempSrc: Temporal  PainSc: 0-No pain         Complications: No apparent anesthesia complications

## 2019-03-31 NOTE — Consult Note (Addendum)
Cardiology Consultation:   Patient ID: Terri Edwards MRN: [id number]; DOB: 1951/02/14  Admit date: 03/30/2019 Date of Consult: 03/31/2019  Primary Care Provider: Asencion Noble, MD Primary Cardiologist: Kirk Ruths, MD  Primary Electrophysiologist:  None    Patient Profile:   Terri Edwards is a 69 y.o. female with a hx of aortic stenosis s/p AVR 06/2011, PAF, HTN, PE, GERD and pseudoaneurysm of the LVOT who is being seen today for the evaluation of anticoagulation in the setting of GI bleed at the request of Dr. Tamala Julian.  History of Present Illness:   Terri Edwards is a 69 yo female with PMH noted above. She underwent aortic valve replacement in April 2013 and cath showed no coronary artery disease. She was admitted about a month afterwards with atrial fib with RVR as well as bilateral PE. Placed on coumadin afterwards. There was a question of sinus of valsalva aneurysm and CT was recommended. She declined. Carotid dopplers were negative 12/2015. Follow up echo 01/2017 showed normal LV function with prior aortic valve replacement with mean gradient 20 mmHg and moderate right atrial enlargement. She was last seen in the office on 02/2018 by Dr. Stanford Breed and there was discussion of switching to Eliquis if cost was not an issue. Also had an outpatient CT scan arranged which showed a pseudoaneurysm arising from the left ventricular outflow tract. It was discussed that she should have an appt with TCTS to further discuss.   She was seen by Dr. Roxy Manns on 04/15/2018 who recommended that she undergo elective repair while she remained healthy and asymptomatic. She did not wish to have surgery at that time but planned to have follow up imaging within 6 months.   She switched to Eliquis on 01/24/19 and had been relatively stable. Presented to the ED on 03/30/19 with progressive shortness of breath. Worsening dyspnea on exertion. Stated she had some intermittent dark stools over the last month. In the ED  her labs showed stable electrolytes, BNP 617, WBC 9.6, Hgb 9.6, hsTn 20>>24. COVID negative. Given her Hgb was 3 g below her baseline along with positive FOBT, GI was consulted. Eliquis was held on admission. She underwent EGD with nonbleeding ulcer. Echo 03/30/19 showed normal EF with severely dilated LA and severe bioprosthetic stenosis with increased mean gradient of 73mmHg to 60 mmHg since prior echo 2018.  Heart Pathway Score:     Past Medical History:  Diagnosis Date   Aortic stenosis    a. 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)    GERD (gastroesophageal reflux disease)    HTN (hypertension), benign    Hx of cardiac cath    [facility] 4/13:  no CAD, severe AS   Hypercholesteremia    Obesity (BMI 30.0-34.9) 06/29/2011   Pseudoaneurysm of left ventricle of heart    Pulmonary embolus (HCC)    following AVR   S/P aortic valve replacement with bioprosthetic valve 07/05/2011   29mm Edwards Magna Ease pericardial tissue valve    Past Surgical History:  Procedure Laterality Date   AORTIC VALVE REPLACEMENT  07/05/2011   Procedure: AORTIC VALVE REPLACEMENT (AVR);  Surgeon:  Rexene Alberts, MD;  Location: [facility];  Service: Open Heart Surgery;  Laterality: N/A;   CATARACT EXTRACTION     bil   KNEE ARTHROSCOPY  lft   PARS PLANA VITRECTOMY W/ REPAIR OF MACULAR HOLE       Home Medications:  Prior to Admission medications   Medication Sig Start Date End Date Taking? Authorizing Provider  acetaminophen (TYLENOL) 500 MG tablet Take 500 mg by mouth every 6 (six) hours as needed for mild pain.   Yes [provider]  albuterol (PROVENTIL HFA;VENTOLIN HFA) 108 (90 Base) MCG/ACT inhaler Inhale 2  puffs into the lungs every 4 (four) hours as needed for wheezing or shortness of breath.   Yes [provider]  amLODipine (NORVASC) 5 MG tablet TAKE 1 TABLET BY MOUTH EVERY DAY Patient taking differently: Take 5 mg by mouth daily.  03/27/19  Yes Lelon Perla, MD  amoxicillin (AMOXIL) 500 MG capsule Take 4 tablets = 2 grams 30-60 minutes before dental procedures 02/19/18  Yes Crenshaw, Denice Bors, MD  apixaban (ELIQUIS) 5 MG TABS tablet Take 1 tablet (5 mg total) by mouth 2 (two) times daily. 03/05/19  Yes Lelon Perla, MD  dextromethorphan (DELSYM) 30 MG/5ML liquid Take 30 mg by mouth as needed for cough.   Yes [provider]  metoprolol succinate (TOPROL-XL) 25 MG 24 hr tablet TAKE 1 TABLET (25 MG TOTAL) BY MOUTH EVERY 12 (TWELVE) HOURS. 05/29/18  Yes Crenshaw, Denice Bors, MD  pantoprazole (PROTONIX) 40 MG tablet Take 1 tablet (40 mg total) by mouth 2 (two) times daily. For reflux Patient taking differently: Take 40 mg by mouth daily as needed (reflux). For reflux 02/19/18  Yes Crenshaw, Denice Bors, MD  SODIUM FLUORIDE 5000 PPM 1.1 % PSTE 1 application by Mouth Rinse route as directed.  03/10/19  Yes [provider]    Inpatient Medications: Scheduled Meds:  [MAR Hold] Chlorhexidine Gluconate Cloth  6 each Topical Daily   [MAR Hold] metoprolol succinate  25 mg Oral Q12H   [MAR Hold] nicotine  21 mg Transdermal Daily   [MAR Hold] pantoprazole  40 mg Oral BID   [MAR Hold] sodium chloride flush  3 mL Intravenous Once   [MAR Hold] sodium chloride flush  3 mL Intravenous Q12H   Continuous Infusions:  [MAR Hold] sodium chloride     lactated ringers 20 mL/hr at 03/31/19 1231   PRN Meds: [MAR Hold] sodium chloride, [MAR Hold] acetaminophen, [MAR Hold] dextromethorphan, [MAR Hold] ipratropium-albuterol, [MAR Hold] ondansetron (ZOFRAN) IV, [MAR Hold] sodium chloride flush  Allergies:    Allergies  Allergen Reactions   Nickel     Rash blisters   Sulfa Antibiotics      Blisters  Rash    Tape     Slight skin irritation    Social History:   Social History   Socioeconomic History   Marital status: Married    Spouse name: Not on file   Number of children: Not on file   Years of education: Not on file   Highest education level: Not on file  Occupational History   Occupation: hospice  Tobacco Use   Smoking status: Former Smoker    Packs/day: 1.00    Years: 45.00    Pack years: 45.00    Types: Cigarettes    Quit date: 07/04/2011    Years since quitting: 7.7   Smokeless tobacco: Never Used  Substance and Sexual Activity   Alcohol use: Yes    Alcohol/week:  1.0 standard drinks    Types: 1 Standard drinks or equivalent per week    Comment: rare   Drug use: Not on file   Sexual activity: Yes    Birth control/protection: Post-menopausal  Other Topics Concern   Not on file  Social History Narrative   Not on file   Social Determinants of Health   Financial Resource Strain:    Difficulty of Paying Living Expenses: Not on file  Food Insecurity:    Worried About Charity fundraiser in the Last Year: Not on file   [employer] of Food in the Last Year: Not on file  Transportation Needs:    Lack of Transportation (Medical): Not on file   Lack of Transportation (Non-Medical): Not on file  Physical Activity:    Days of Exercise per Week: Not on file   Minutes of Exercise per Session: Not on file  Stress:    Feeling of Stress : Not on file  Social Connections:    Frequency of Communication with Friends and Family: Not on file   Frequency of Social Gatherings with Friends and Family: Not on file   Attends Religious Services: Not on file   Active Member of Clubs or Organizations: Not on file   Attends Archivist Meetings: Not on file   Marital Status: Not on file  Intimate Partner Violence:    Fear of Current or Ex-Partner: Not on file   Emotionally Abused: Not on file   Physically Abused: Not on file   Sexually Abused: Not on file      Family History:    Family History  Problem Relation Age of Onset   Stroke Father    Heart attack Mother      ROS:  Please see the history of present illness.   All other ROS reviewed and negative.     Physical Exam/Data:   Vitals:   03/31/19 0731 03/31/19 0825 03/31/19 0947 03/31/19 1223  BP:  (!) 108/56 (!) 115/54 127/75  Pulse:  70 96 73  Resp:  16 19 19   Temp:  98 F (36.7 C) 98.4 F (36.9 C)   TempSrc:  Oral Oral   SpO2: 97% 97% 94% 98%  Weight:   77.5 kg   Height:   5\' 2"  (1.575 m)     Intake/Output Summary (Last 24 hours) at 03/31/2019 1321 Last data filed at 03/31/2019 0600 Gross per 24 hour  Intake --  Output 1000 ml  Net -1000 ml   Last 3 Weights 03/31/2019 03/30/2019 04/15/2018  Weight (lbs) 170 lb 12.8 oz 170 lb 12.8 oz 159 lb  Weight (kg) 77.474 kg 77.474 kg 72.122 kg     Body mass index is 31.24 kg/m.  General:  Well nourished, well developed, in no acute distress HEENT: normal Lymph: no adenopathy Neck: no JVD Endocrine:  No thryomegaly Vascular: No carotid bruits; FA pulses 2+ bilaterally without bruits  Cardiac:  normal S1, S2; RRR; 3/6 systolic murmur Lungs:  clear to auscultation bilaterally, no wheezing, rhonchi or rales  Abd: soft, nontender, no hepatomegaly  Ext: no edema Musculoskeletal:  No deformities, BUE and BLE strength normal and equal Skin: warm and dry  Neuro:  CNs 2-12 intact, no focal abnormalities noted Psych:  Normal affect   EKG:  The EKG was personally reviewed and demonstrates: SR with biphasic TW in inferolateral leads  Relevant CV Studies:  TTE: 03/30/19   IMPRESSIONS      1. Left ventricular ejection fraction,  by visual estimation, is 60 to 65%. The left ventricle has normal function. There is mildly increased left ventricular hypertrophy.  2. Left ventricular diastolic parameters are consistent with Grade II diastolic dysfunction (pseudonormalization).  3. Elevated left atrial pressure.  4. Global right  ventricle has normal systolic function.The right ventricular size is normal.  5. Left atrial size was severely dilated.  6. Right atrial size was mildly dilated.  7. Moderate mitral annular calcification.  8. The mitral valve is abnormal. Mild mitral valve regurgitation.  9. The tricuspid valve is normal in structure. Tricuspid valve regurgitation is mild. 10. The pulmonic valve was not well visualized. Pulmonic valve regurgitation is not visualized. 11. The tricuspid regurgitant velocity is 3.07 m/s, and with an assumed right atrial pressure of 15 mmHg, the estimated right ventricular systolic pressure is moderately elevated at 52.7 mmHg. 12. The inferior vena cava is dilated in size with <50% respiratory variability, suggesting right atrial pressure of 15 mmHg. 13. 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 Consider gated CT to evaluate for stability 14. S/p Bioprosthetic aortic valve replacement. No regurgitation seen. Severe bioprosthetic stenosis (Vmax 4.9 m/s, MG 60 mmHg, EOA 0.5 cm^2, DVI 0.27, AT 148 ms). Compared to prior TTE on 02/05/17, there is a marked increase in mean gradients (31mmHg to  47mmHg).  Laboratory Data:  High Sensitivity Troponin:   Recent Labs  Lab 03/30/19 0900 03/30/19 1130  TROPONINIHS 20* 24*     Chemistry Recent Labs  Lab 03/30/19 0900 03/31/19 0310  NA 135 139  K 4.7 4.2  CL 101 100  CO2 27 30  GLUCOSE 113* 98  BUN 13 12  CREATININE 0.82 0.88  CALCIUM 8.8* 8.8*  GFRNONAA >60 >60  GFRAA >60 >60  ANIONGAP 7 9    No results for input(s): PROT, ALBUMIN, AST, ALT, ALKPHOS, BILITOT in the last 168 hours. Hematology Recent Labs  Lab 03/30/19 0900 03/30/19 1600  WBC 9.6 9.4  RBC 3.40* 3.27*  HGB 9.6* 9.2*  HCT 31.5* 30.2*  MCV 92.6 92.4  MCH 28.2 28.1  MCHC 30.5 30.5  RDW 14.4 14.5  PLT 311 297   BNP Recent Labs  Lab 03/30/19 0900  BNP 617.2*    DDimer No results for input(s): DDIMER in the last 168  hours.   Radiology/Studies:  DG Chest Portable 1 View  Result Date: 03/30/2019 CLINICAL DATA:  Shortness of breath, hypoxia EXAM: PORTABLE CHEST 1 VIEW COMPARISON:  Chest radiograph dated 07/28/2011 FINDINGS: The heart is enlarged. Vascular calcifications are seen in the aortic arch. Median sternotomy wires and an aortic valve prosthesis are redemonstrated. The lungs are clear. There is no pleural effusion or pneumothorax. Degenerative changes are seen in the spine. IMPRESSION: Cardiomegaly.  No acute cardiopulmonary findings. Aortic Atherosclerosis (ICD10-I70.0). Electronically Signed   By: Zerita Boers M.D.   On: 03/30/2019 10:34   ECHOCARDIOGRAM COMPLETE  Result Date: 03/30/2019   ECHOCARDIOGRAM REPORT   Patient Name:   Terri Edwards Date of Exam: 03/30/2019 Medical Rec #:  [id number]          Height:       62.0 in Accession #:    [id number]         Weight:       159.0 lb Date of Birth:  November 04, 1950          BSA:          1.73 m Patient Age:    4 years  BP:           114/52 mmHg Patient Gender: F                  HR:           74 bpm. Exam Location:  Inpatient Procedure: 2D Echo Indications:    cardiomegaly 429.3  History:        Patient has prior history of Echocardiogram examinations, most                 recent 02/05/2017. Signs/Symptoms:Dyspnea; Risk                 Factors:Hypertension, Current Smoker and Sleep Apnea. Aortic                 Valve: A  Sonographer:    Johny Chess Referring Phys: [id number] RONDELL A SMITH IMPRESSIONS  1. Left ventricular ejection fraction, by visual estimation, is 60 to 65%. The left ventricle has normal function. There is mildly increased left ventricular hypertrophy.  2. Left ventricular diastolic parameters are consistent with Grade II diastolic dysfunction (pseudonormalization).  3. Elevated left atrial pressure.  4. Global right ventricle has normal systolic function.The right ventricular size is normal.  5. Left atrial size was severely dilated.   6. Right atrial size was mildly dilated.  7. Moderate mitral annular calcification.  8. The mitral valve is abnormal. Mild mitral valve regurgitation.  9. The tricuspid valve is normal in structure. Tricuspid valve regurgitation is mild. 10. The pulmonic valve was not well visualized. Pulmonic valve regurgitation is not visualized. 11. The tricuspid regurgitant velocity is 3.07 m/s, and with an assumed right atrial pressure of 15 mmHg, the estimated right ventricular systolic pressure is moderately elevated at 52.7 mmHg. 12. The inferior vena cava is dilated in size with <50% respiratory variability, suggesting right atrial pressure of 15 mmHg. 13. 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 Consider gated CT to evaluate for stability 14. S/p Bioprosthetic aortic valve replacement. No regurgitation seen. Severe bioprosthetic stenosis (Vmax 4.9 m/s, MG 60 mmHg, EOA 0.5 cm^2, DVI 0.27, AT 148 ms). Compared to prior TTE on 02/05/17, there is a marked increase in mean gradients (92mmHg to  8mmHg). FINDINGS  Left Ventricle: Left ventricular ejection fraction, by visual estimation, is 60 to 65%. The left ventricle has normal function. The left ventricle has no regional wall motion abnormalities. There is mildly increased left ventricular hypertrophy. Left ventricular diastolic parameters are consistent with Grade II diastolic dysfunction (pseudonormalization). Elevated left atrial pressure. Right Ventricle: The right ventricular size is normal. No increase in right ventricular wall thickness. Global RV systolic function is has normal systolic function. The tricuspid regurgitant velocity is 3.07 m/s, and with an assumed right atrial pressure  of 15 mmHg, the estimated right ventricular systolic pressure is moderately elevated at 52.7 mmHg. Left Atrium: Left atrial size was severely dilated. Right Atrium: Right atrial size was mildly dilated Pericardium: Trivial pericardial effusion is present.  Mitral Valve: The mitral valve is abnormal. There is mild thickening of the mitral valve leaflet(s). Moderate mitral annular calcification. Mild mitral valve regurgitation. Tricuspid Valve: The tricuspid valve is normal in structure. Tricuspid valve regurgitation is mild. Aortic Valve: The aortic valve has been repaired/replaced. Aortic valve regurgitation is not visualized. Aortic valve mean gradient measures 50.0 mmHg. Aortic valve peak gradient measures 79.9 mmHg. Aortic valve area, by VTI measures 0.65 cm. Bioprosthetic aortic valve replacement. No regurgitation seen. Severe bioprosthetic stenosis (Vmax  4.9 m/s, MG 60 mmHg, EOA 0.7 cm^2, DVI 0.27). Pulmonic Valve: The pulmonic valve was not well visualized. Pulmonic valve regurgitation is not visualized. Pulmonic regurgitation is not visualized. Aorta: The aortic root is normal in size and structure. Venous: The inferior vena cava is dilated in size with less than 50% respiratory variability, suggesting right atrial pressure of 15 mmHg. IAS/Shunts: The atrial septum is grossly normal.  LEFT VENTRICLE PLAX 2D LVIDd:         5.10 cm  Diastology LVIDs:         3.60 cm  LV e' lateral:   8.81 cm/s LV PW:         1.00 cm  LV E/e' lateral: 15.3 LV IVS:        0.90 cm  LV e' medial:    7.94 cm/s LVOT diam:     1.50 cm  LV E/e' medial:  17.0 LV SV:         69 ml LV SV Index:   38.54 LVOT Area:     1.77 cm  RIGHT VENTRICLE RV S prime:     10.30 cm/s TAPSE (M-mode): 1.7 cm LEFT ATRIUM              Index       RIGHT ATRIUM           Index LA diam:        5.10 cm  2.94 cm/m  RA Area:     19.40 cm LA Vol (A2C):   97.1 ml  55.99 ml/m RA Volume:   52.10 ml  30.04 ml/m LA Vol (A4C):   101.0 ml 58.24 ml/m LA Biplane Vol: 101.0 ml 58.24 ml/m  AORTIC VALVE AV Area (Vmax):    0.68 cm AV Area (Vmean):   0.63 cm AV Area (VTI):     0.65 cm AV Vmax:           447.00 cm/s AV Vmean:          334.000 cm/s AV VTI:            1.150 m AV Peak Grad:      79.9 mmHg AV Mean Grad:       50.0 mmHg LVOT Vmax:         172.00 cm/s LVOT Vmean:        119.750 cm/s LVOT VTI:          0.424 m LVOT/AV VTI ratio: 0.37  AORTA Ao Root diam: 2.70 cm MITRAL VALVE                         TRICUSPID VALVE MV Area (PHT): 4.39 cm              TR Peak grad:   37.7 mmHg MV PHT:        50.17 msec            TR Vmax:        307.00 cm/s MV Decel Time: 173 msec MV E velocity: 135.00 cm/s 103 cm/s  SHUNTS MV A velocity: 78.50 cm/s  70.3 cm/s Systemic VTI:  0.42 m MV E/A ratio:  1.72        1.5       Systemic Diam: 1.50 cm  Oswaldo Milian MD Electronically signed by Oswaldo Milian MD Signature Date/Time: 03/30/2019/10:41:40 PM    Final    Assessment and Plan:   Terri Edwards is a 69 y.o. female with  a hx of aortic stenosis s/p AVR 06/2011, PAF, HTN, PE, GERD and pseudoaneurysm of the LVOT who is being seen today for the evaluation of anticoagulation in the setting of GI bleed at the request of Dr. Tamala Julian.  1. Aortic stenosis s/p AVR '13: echo this admission showed worsening mean gradient from 5mmHg to 13mmHg since echo from 2018. Suspect this may be contributing to her dyspnea.  -- will plan for [facility] tomorrow as Eliquis has been held. Suspect will need TCTS consult.   -- The patient understands that risks included but are not limited to stroke (1 in 1000), death (1 in 39), kidney failure [usually temporary] (1 in 500), bleeding (1 in 200), allergic reaction [possibly serious] (1 in 200).    2. Anemia with melena: Hgb 12>>9.6. Underwent EGD with GI showing hiatal hernia and non- bleeding gastric ulcer. Recommendations for daily PPI and would need a colonoscopy but did not proceed as she became hypotensive during EGD.   3. PAF: has been on coumadin but recently switched to Eliquis back 11/20. Currently held in the setting of GI bleeding.   4. Hx of PE: as above, transitioned to Eliquis. Currently held.   5. Pseudoaneurysm of LVOT: has been seen by Dr. Roxy Manns back 03/2018 and patient elected not  to have surgery at that time. Planned for follow up in 6 months but deferred as a result of COVID.   6. Tobacco abuse: cessation advised.  For questions or updates, please contact [facility] Please consult [email or web address] for contact info under     Signed, Reino Bellis, NP  03/31/2019 1:21 PM  Agree with note by Reino Bellis NP-C  Pleasant 69 year old married Caucasian female patient of Dr. Jacalyn Lefevre and Dr. Guy Sandifer  admitted with symptomatic shortness of breath and anemia.  She is a history of bioprosthetic aortic valve replacement 07/05/2011.  Other problems as outlined.  She has had progressive decline in her hemoglobin after switching from Coumadin to Eliquis which she was on for PAF, pulmonary embolism and DVT.  She just underwent endoscopy today which is complicated by hypotension.  Her 2D echo recently performed shows increase in prosthetic valve stenosis now with a peak gradient of 60 mmHg (0.5 cm).  There is also a pseudoaneurysm arising rising subvalvular potentially from the noncoronary cusp which has been followed by Dr. Roxy Manns .  He saw her a year ago and reck recommended surgical repair at that time since it had grown since the original CT scan done at the time of surgery.  On exam she has a typical outflow tract murmur aortic stenosis as well as bruits in her carotids which may be transmitted murmur.  Based on her symptoms and her echo results I have recommended that we proceed with right left heart cath tomorrow (radial/brachial).  I have made Dr. Roxy Manns  aware so that he can see her tomorrow as well.  Lorretta Harp, M.D., [place], [facility], [place] [address] [place], [place]  [address]  [phone] 03/31/2019 4:22 PM

## 2019-03-31 NOTE — Anesthesia Postprocedure Evaluation (Signed)
Anesthesia Post Note  Patient: LARYN VENNING  Procedure(s) Performed: ESOPHAGOGASTRODUODENOSCOPY (EGD) WITH PROPOFOL (N/A )     Patient location during evaluation: PACU Anesthesia Type: MAC Level of consciousness: awake and alert Pain management: pain level controlled Vital Signs Assessment: post-procedure vital signs reviewed and stable Respiratory status: spontaneous breathing, nonlabored ventilation and respiratory function stable Cardiovascular status: stable and blood pressure returned to baseline Anesthetic complications: no    Last Vitals:  Vitals:   03/31/19 1432 03/31/19 1442  BP: (!) 115/47 (!) 122/50  Pulse: 69 66  Resp: (!) 23 13  Temp:    SpO2: 98% 96%    Last Pain:  Vitals:   03/31/19 1442  TempSrc:   PainSc: 0-No pain                 Audry Pili

## 2019-03-31 NOTE — Progress Notes (Addendum)
PROGRESS NOTE    [name]  [id number] DOB: [birth year] DOA: 03/30/2019 PCP: Asencion Noble, MD  Brief Narrative: Terri Edwards is a 69 y.o. female with medical history significant of aortic stenosis s/p bioprosthetic AVR followed by Dr. Stanford Breed, paroxysmal atrial fibrillation, PE, hypertension, hyperlipidemia, and obesity.  She presents with complaints of progressively worsening shortness of breath over the last few weeks.  She noticed that she was unable to walk as far as she previously could without getting winded or needing to rest.  Symptoms seem to be worse in the morning upon wakening.  She reports that she is intermittently had some dark stools within the last month, she was recently switched from Coumadin over to Eliquis in November and occasional NSAID use. -In the emergency room she was noted to have a hemoglobin of 9.6 which is 3 g lower than recent baseline and Hemoccult positive. -2D echocardiogram noted considerable worsening of aortic stenosis  Assessment & Plan:   Symptomatic anemia/intermittent melena -Hemoglobin down 3 g from baseline -Eliquis on hold -Continue IV PPI -Gastroenterology consulted, would benefit from endoscopic evaluation -Monitor hemoglobin  Aortic stenosis/status post AVR 06/2011 -Repeat echo yesterday notes severe stenosis of bioprosthetic valve -Suspect this is also contributing to her dyspnea on exertion -Cards input was requested by admitting MD   Paroxysmal atrial fibrillation -Holding Eliquis at this time, continue metoprolol  Chronic diastolic CHF -Appears euvolemic at this time -Given single dose of Lasix yesterday, monitor  Minimally elevated high-sensitivity troponin -Likely secondary to demand from anemia and AS, monitor  Tobacco abuse -Counseled, continue nicotine patch  Hypertension -Continue metoprolol, hold amlodipine  Obesity/BMI 31  DVT prophylaxis: SCDs Code Status: Full code Family Communication: Discussed  with patient and spouse at bedside Disposition Plan: Home pending GI work-up  Consultants:   Gastroenterology, cardiology   Procedures:   Antimicrobials:    Subjective: -Feels a little better from yesterday, worried about the upcoming tests  Objective: Vitals:   03/31/19 0647 03/31/19 0731 03/31/19 0825 03/31/19 0947  BP: (!) 113/58  (!) 108/56 (!) 115/54  Pulse: 66  70 96  Resp:   16 19  Temp: 98.2 F (36.8 C)  98 F (36.7 C) 98.4 F (36.9 C)  TempSrc: Oral  Oral Oral  SpO2: 97% 97% 97% 94%  Weight:    77.5 kg  Height:    5\' 2"  (1.575 m)    Intake/Output Summary (Last 24 hours) at 03/31/2019 1059 Last data filed at 03/31/2019 0600 Gross per 24 hour  Intake --  Output 1000 ml  Net -1000 ml   Filed Weights   03/30/19 2057 03/31/19 0947  Weight: 77.5 kg 77.5 kg    Examination:  General exam: Appears calm and comfortable, AAO x3 Respiratory system: Poor air movement  cardiovascular system: S1 & S2 heard, RRR.  Systolic ejection murmur  gastrointestinal system: Abdomen is nondistended, soft and nontender.Normal bowel sounds heard. Central nervous system: Alert and oriented. No focal neurological deficits. Extremities: No edema Skin: No rashes, lesions or ulcers Psychiatry: Judgement and insight appear normal. Mood & affect appropriate.     Data Reviewed:   CBC: Recent Labs  Lab 03/30/19 0900 03/30/19 1600  WBC 9.6 9.4  NEUTROABS  --  5.2  HGB 9.6* 9.2*  HCT 31.5* 30.2*  MCV 92.6 92.4  PLT 311 [id number]   Basic Metabolic Panel: Recent Labs  Lab 03/30/19 0900 03/31/19 0310  NA 135 139  K 4.7 4.2  CL 101 100  CO2 27  30  GLUCOSE 113* 98  BUN 13 12  CREATININE 0.82 0.88  CALCIUM 8.8* 8.8*   GFR: Estimated Creatinine Clearance: 58.2 mL/min (by C-G formula based on SCr of 0.88 mg/dL). Liver Function Tests: No results for input(s): AST, ALT, ALKPHOS, BILITOT, PROT, ALBUMIN in the last 168 hours. No results for input(s): LIPASE, AMYLASE in the last  168 hours. No results for input(s): AMMONIA in the last 168 hours. Coagulation Profile: No results for input(s): INR, PROTIME in the last 168 hours. Cardiac Enzymes: No results for input(s): CKTOTAL, CKMB, CKMBINDEX, TROPONINI in the last 168 hours. BNP (last 3 results) No results for input(s): PROBNP in the last 8760 hours. HbA1C: No results for input(s): HGBA1C in the last 72 hours. CBG: No results for input(s): GLUCAP in the last 168 hours. Lipid Profile: No results for input(s): CHOL, HDL, LDLCALC, TRIG, CHOLHDL, LDLDIRECT in the last 72 hours. Thyroid Function Tests: No results for input(s): TSH, T4TOTAL, FREET4, T3FREE, THYROIDAB in the last 72 hours. Anemia Panel: No results for input(s): VITAMINB12, FOLATE, FERRITIN, TIBC, IRON, RETICCTPCT in the last 72 hours. Urine analysis:    Component Value Date/Time   COLORURINE YELLOW 07/13/2011 1618   APPEARANCEUR CLEAR 07/13/2011 1618   LABSPEC 1.011 07/13/2011 1618   PHURINE 6.0 07/13/2011 1618   GLUCOSEU NEGATIVE 07/13/2011 1618   HGBUR NEGATIVE 07/13/2011 1618   BILIRUBINUR NEGATIVE 07/13/2011 1618   KETONESUR NEGATIVE 07/13/2011 1618   PROTEINUR NEGATIVE 07/13/2011 1618   UROBILINOGEN 1.0 07/13/2011 1618   NITRITE NEGATIVE 07/13/2011 1618   LEUKOCYTESUR NEGATIVE 07/13/2011 1618   Sepsis Labs: @LABRCNTIP (procalcitonin:4,lacticidven:4)  ) Recent Results (from the past 240 hour(s))  Respiratory Panel by RT PCR (Flu A&B, Covid) - Nasopharyngeal Swab     Status: None   Collection Time: 03/30/19  1:35 PM   Specimen: Nasopharyngeal Swab  Result Value Ref Range Status   SARS Coronavirus 2 by RT PCR NEGATIVE NEGATIVE Final    Comment: (NOTE) SARS-CoV-2 target nucleic acids are NOT DETECTED. The SARS-CoV-2 RNA is generally detectable in upper respiratoy specimens during the acute phase of infection. The lowest concentration of SARS-CoV-2 viral copies this assay can detect is 131 copies/mL. A negative result does not  preclude SARS-Cov-2 infection and should not be used as the sole basis for treatment or other patient management decisions. A negative result may occur with  improper specimen collection/handling, submission of specimen other than nasopharyngeal swab, presence of viral mutation(s) within the areas targeted by this assay, and inadequate number of viral copies (<131 copies/mL). A negative result must be combined with clinical observations, patient history, and epidemiological information. The expected result is Negative. Fact Sheet for Patients:  PinkCheek.be Fact Sheet for Healthcare Providers:  GravelBags.it This test is not yet ap proved or cleared by the [place] FDA and  has been authorized for detection and/or diagnosis of SARS-CoV-2 by FDA under an Emergency Use Authorization (EUA). This EUA will remain  in effect (meaning this test can be used) for the duration of the COVID-19 declaration under Section 564(b)(1) of the Act, 21 U.S.C. section 360bbb-3(b)(1), unless the authorization is terminated or revoked sooner.    Influenza A by PCR NEGATIVE NEGATIVE Final   Influenza B by PCR NEGATIVE NEGATIVE Final    Comment: (NOTE) The Xpert Xpress SARS-CoV-2/FLU/RSV assay is intended as an aid in  the diagnosis of influenza from Nasopharyngeal swab specimens and  should not be used as a sole basis for treatment. Nasal washings and  aspirates are unacceptable  for Xpert Xpress SARS-CoV-2/FLU/RSV  testing. Fact Sheet for Patients: PinkCheek.be Fact Sheet for Healthcare Providers: GravelBags.it This test is not yet approved or cleared by the [place] FDA and  has been authorized for detection and/or diagnosis of SARS-CoV-2 by  FDA under an Emergency Use Authorization (EUA). This EUA will remain  in effect (meaning this test can be used) for the duration of the    Covid-19 declaration under Section 564(b)(1) of the Act, 21  U.S.C. section 360bbb-3(b)(1), unless the authorization is  terminated or revoked. Performed at [facility], [place] [address] [place], [place] [address]          Radiology Studies: DG Chest Portable 1 View  Result Date: 03/30/2019 CLINICAL DATA:  Shortness of breath, hypoxia EXAM: PORTABLE CHEST 1 VIEW COMPARISON:  Chest radiograph dated 07/28/2011 FINDINGS: The heart is enlarged. Vascular calcifications are seen in the aortic arch. Median sternotomy wires and an aortic valve prosthesis are redemonstrated. The lungs are clear. There is no pleural effusion or pneumothorax. Degenerative changes are seen in the spine. IMPRESSION: Cardiomegaly.  No acute cardiopulmonary findings. Aortic Atherosclerosis (ICD10-I70.0). Electronically Signed   By: Zerita Boers M.D.   On: 03/30/2019 10:34   ECHOCARDIOGRAM COMPLETE  Result Date: 03/30/2019   ECHOCARDIOGRAM REPORT   Patient Name:   Terri Edwards Date of Exam: 03/30/2019 Medical Rec #:  [id number]          Height:       62.0 in Accession #:    [id number]         Weight:       159.0 lb Date of Birth:  02/03/1951          BSA:          1.73 m Patient Age:    53 years           BP:           114/52 mmHg Patient Gender: F                  HR:           74 bpm. Exam Location:  Inpatient Procedure: 2D Echo Indications:    cardiomegaly 429.3  History:        Patient has prior history of Echocardiogram examinations, most                 recent 02/05/2017. Signs/Symptoms:Dyspnea; Risk                 Factors:Hypertension, Current Smoker and Sleep Apnea. Aortic                 Valve: A  Sonographer:    Johny Chess Referring Phys: [id number] RONDELL A SMITH IMPRESSIONS  1. Left ventricular ejection fraction, by visual estimation, is 60 to 65%. The left ventricle has normal function. There is mildly increased left ventricular hypertrophy.  2. Left ventricular diastolic parameters are consistent  with Grade II diastolic dysfunction (pseudonormalization).  3. Elevated left atrial pressure.  4. Global right ventricle has normal systolic function.The right ventricular size is normal.  5. Left atrial size was severely dilated.  6. Right atrial size was mildly dilated.  7. Moderate mitral annular calcification.  8. The mitral valve is abnormal. Mild mitral valve regurgitation.  9. The tricuspid valve is normal in structure. Tricuspid valve regurgitation is mild. 10. The pulmonic valve was not well visualized. Pulmonic valve regurgitation is not visualized. 11. The tricuspid regurgitant velocity  is 3.07 m/s, and with an assumed right atrial pressure of 15 mmHg, the estimated right ventricular systolic pressure is moderately elevated at 52.7 mmHg. 12. The inferior vena cava is dilated in size with <50% respiratory variability, suggesting right atrial pressure of 15 mmHg. 13. 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 Consider gated CT to evaluate for stability 14. S/p Bioprosthetic aortic valve replacement. No regurgitation seen. Severe bioprosthetic stenosis (Vmax 4.9 m/s, MG 60 mmHg, EOA 0.5 cm^2, DVI 0.27, AT 148 ms). Compared to prior TTE on 02/05/17, there is a marked increase in mean gradients (33mmHg to  45mmHg). FINDINGS  Left Ventricle: Left ventricular ejection fraction, by visual estimation, is 60 to 65%. The left ventricle has normal function. The left ventricle has no regional wall motion abnormalities. There is mildly increased left ventricular hypertrophy. Left ventricular diastolic parameters are consistent with Grade II diastolic dysfunction (pseudonormalization). Elevated left atrial pressure. Right Ventricle: The right ventricular size is normal. No increase in right ventricular wall thickness. Global RV systolic function is has normal systolic function. The tricuspid regurgitant velocity is 3.07 m/s, and with an assumed right atrial pressure  of 15 mmHg, the estimated  right ventricular systolic pressure is moderately elevated at 52.7 mmHg. Left Atrium: Left atrial size was severely dilated. Right Atrium: Right atrial size was mildly dilated Pericardium: Trivial pericardial effusion is present. Mitral Valve: The mitral valve is abnormal. There is mild thickening of the mitral valve leaflet(s). Moderate mitral annular calcification. Mild mitral valve regurgitation. Tricuspid Valve: The tricuspid valve is normal in structure. Tricuspid valve regurgitation is mild. Aortic Valve: The aortic valve has been repaired/replaced. Aortic valve regurgitation is not visualized. Aortic valve mean gradient measures 50.0 mmHg. Aortic valve peak gradient measures 79.9 mmHg. Aortic valve area, by VTI measures 0.65 cm. Bioprosthetic aortic valve replacement. No regurgitation seen. Severe bioprosthetic stenosis (Vmax 4.9 m/s, MG 60 mmHg, EOA 0.7 cm^2, DVI 0.27). Pulmonic Valve: The pulmonic valve was not well visualized. Pulmonic valve regurgitation is not visualized. Pulmonic regurgitation is not visualized. Aorta: The aortic root is normal in size and structure. Venous: The inferior vena cava is dilated in size with less than 50% respiratory variability, suggesting right atrial pressure of 15 mmHg. IAS/Shunts: The atrial septum is grossly normal.  LEFT VENTRICLE PLAX 2D LVIDd:         5.10 cm  Diastology LVIDs:         3.60 cm  LV e' lateral:   8.81 cm/s LV PW:         1.00 cm  LV E/e' lateral: 15.3 LV IVS:        0.90 cm  LV e' medial:    7.94 cm/s LVOT diam:     1.50 cm  LV E/e' medial:  17.0 LV SV:         69 ml LV SV Index:   38.54 LVOT Area:     1.77 cm  RIGHT VENTRICLE RV S prime:     10.30 cm/s TAPSE (M-mode): 1.7 cm LEFT ATRIUM              Index       RIGHT ATRIUM           Index LA diam:        5.10 cm  2.94 cm/m  RA Area:     19.40 cm LA Vol (A2C):   97.1 ml  55.99 ml/m RA Volume:   52.10 ml  30.04 ml/m LA Vol (  A4C):   101.0 ml 58.24 ml/m LA Biplane Vol: 101.0 ml 58.24 ml/m   AORTIC VALVE AV Area (Vmax):    0.68 cm AV Area (Vmean):   0.63 cm AV Area (VTI):     0.65 cm AV Vmax:           447.00 cm/s AV Vmean:          334.000 cm/s AV VTI:            1.150 m AV Peak Grad:      79.9 mmHg AV Mean Grad:      50.0 mmHg LVOT Vmax:         172.00 cm/s LVOT Vmean:        119.750 cm/s LVOT VTI:          0.424 m LVOT/AV VTI ratio: 0.37  AORTA Ao Root diam: 2.70 cm MITRAL VALVE                         TRICUSPID VALVE MV Area (PHT): 4.39 cm              TR Peak grad:   37.7 mmHg MV PHT:        50.17 msec            TR Vmax:        307.00 cm/s MV Decel Time: 173 msec MV E velocity: 135.00 cm/s 103 cm/s  SHUNTS MV A velocity: 78.50 cm/s  70.3 cm/s Systemic VTI:  0.42 m MV E/A ratio:  1.72        1.5       Systemic Diam: 1.50 cm  Oswaldo Milian MD Electronically signed by Oswaldo Milian MD Signature Date/Time: 03/30/2019/10:41:40 PM    Final         Scheduled Meds: . amLODipine  5 mg Oral Daily  . Chlorhexidine Gluconate Cloth  6 each Topical Daily  . metoprolol succinate  25 mg Oral Q12H  . nicotine  21 mg Transdermal Daily  . pantoprazole  40 mg Oral BID  . sodium chloride flush  3 mL Intravenous Once  . sodium chloride flush  3 mL Intravenous Q12H   Continuous Infusions: . sodium chloride       LOS: 1 day    Time spent: 108min  Domenic Polite, MD [facility]  03/31/2019, 10:59 AM

## 2019-03-31 NOTE — Anesthesia Procedure Notes (Signed)
Arterial Line Insertion Start/End1/01/2020 1:08 PM, 03/31/2019 1:16 PM Performed by: Glynda Jaeger, CRNA, CRNA  Preanesthetic checklist: patient identified, IV checked, site marked, risks and benefits discussed, surgical consent, monitors and equipment checked, pre-op evaluation, timeout performed and anesthesia consent Right, radial was placed Catheter size: 20 G Hand hygiene performed  and maximum sterile barriers used  Allen's test indicative of satisfactory collateral circulation Attempts: 1 Procedure performed without using ultrasound guided technique. Following insertion, dressing applied and Biopatch. Post procedure assessment: normal  Patient tolerated the procedure well with no immediate complications.

## 2019-03-31 NOTE — Telephone Encounter (Signed)
Patients husband calling from his wife's hospital room. States that he would like to speak with Hilda Blades before he cancelled his wife's appt or his. He's not sure when they will be out of the hospital.

## 2019-03-31 NOTE — H&P (View-Only) (Signed)
Cardiology Consultation:   Patient ID: Terri Edwards MRN: [id number]; DOB: [birth year]  Admit date: 03/30/2019 Date of Consult: 03/31/2019  Primary Care Provider: Asencion Noble, MD Primary Cardiologist: Kirk Ruths, MD  Primary Electrophysiologist:  None    Patient Profile:   Terri Edwards is a 69 y.o. female with a hx of aortic stenosis s/p AVR 06/2011, PAF, HTN, PE, GERD and pseudoaneurysm of the LVOT who is being seen today for the evaluation of anticoagulation in the setting of GI bleed at the request of Dr. Tamala Julian.  History of Present Illness:   Terri Edwards is a 69 yo female with PMH noted above. She underwent aortic valve replacement in April 2013 and cath showed no coronary artery disease. She was admitted about a month afterwards with atrial fib with RVR as well as bilateral PE. Placed on coumadin afterwards. There was a question of sinus of valsalva aneurysm and CT was recommended. She declined. Carotid dopplers were negative 12/2015. Follow up echo 01/2017 showed normal LV function with prior aortic valve replacement with mean gradient 20 mmHg and moderate right atrial enlargement. She was last seen in the office on 02/2018 by Dr. Stanford Breed and there was discussion of switching to Eliquis if cost was not an issue. Also had an outpatient CT scan arranged which showed a pseudoaneurysm arising from the left ventricular outflow tract. It was discussed that she should have an appt with TCTS to further discuss.   She was seen by Dr. Roxy Manns on 04/15/2018 who recommended that she undergo elective repair while she remained healthy and asymptomatic. She did not wish to have surgery at that time but planned to have follow up imaging within 6 months.   She switched to Eliquis on 01/24/19 and had been relatively stable. Presented to the ED on 03/30/19 with progressive shortness of breath. Worsening dyspnea on exertion. Stated she had some intermittent dark stools over the last month. In the ED  her labs showed stable electrolytes, BNP 617, WBC 9.6, Hgb 9.6, hsTn 20>>24. COVID negative. Given her Hgb was 3 g below her baseline along with positive FOBT, GI was consulted. Eliquis was held on admission. She underwent EGD with nonbleeding ulcer. Echo 03/30/19 showed normal EF with severely dilated LA and severe bioprosthetic stenosis with increased mean gradient of 50mmHg to 60 mmHg since prior echo 2018.  Heart Pathway Score:     Past Medical History:  Diagnosis Date   Aortic stenosis    a. 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)    GERD (gastroesophageal reflux disease)    HTN (hypertension), benign    Hx of cardiac cath    [facility] 4/13:  no CAD, severe AS   Hypercholesteremia    Obesity (BMI 30.0-34.9) 06/29/2011   Pseudoaneurysm of left ventricle of heart    Pulmonary embolus (HCC)    following AVR   S/P aortic valve replacement with bioprosthetic valve 07/05/2011   1mm Edwards Magna Ease pericardial tissue valve    Past Surgical History:  Procedure Laterality Date   AORTIC VALVE REPLACEMENT  07/05/2011   Procedure: AORTIC VALVE REPLACEMENT (AVR);  Surgeon:  Rexene Alberts, MD;  Location: [facility];  Service: Open Heart Surgery;  Laterality: N/A;   CATARACT EXTRACTION     bil   KNEE ARTHROSCOPY  lft   PARS PLANA VITRECTOMY W/ REPAIR OF MACULAR HOLE       Home Medications:  Prior to Admission medications   Medication Sig Start Date End Date Taking? Authorizing Provider  acetaminophen (TYLENOL) 500 MG tablet Take 500 mg by mouth every 6 (six) hours as needed for mild pain.   Yes [provider]  albuterol (PROVENTIL HFA;VENTOLIN HFA) 108 (90 Base) MCG/ACT inhaler Inhale 2  puffs into the lungs every 4 (four) hours as needed for wheezing or shortness of breath.   Yes [provider]  amLODipine (NORVASC) 5 MG tablet TAKE 1 TABLET BY MOUTH EVERY DAY Patient taking differently: Take 5 mg by mouth daily.  03/27/19  Yes Lelon Perla, MD  amoxicillin (AMOXIL) 500 MG capsule Take 4 tablets = 2 grams 30-60 minutes before dental procedures 02/19/18  Yes Crenshaw, Denice Bors, MD  apixaban (ELIQUIS) 5 MG TABS tablet Take 1 tablet (5 mg total) by mouth 2 (two) times daily. 03/05/19  Yes Lelon Perla, MD  dextromethorphan (DELSYM) 30 MG/5ML liquid Take 30 mg by mouth as needed for cough.   Yes [provider]  metoprolol succinate (TOPROL-XL) 25 MG 24 hr tablet TAKE 1 TABLET (25 MG TOTAL) BY MOUTH EVERY 12 (TWELVE) HOURS. 05/29/18  Yes Crenshaw, Denice Bors, MD  pantoprazole (PROTONIX) 40 MG tablet Take 1 tablet (40 mg total) by mouth 2 (two) times daily. For reflux Patient taking differently: Take 40 mg by mouth daily as needed (reflux). For reflux 02/19/18  Yes Crenshaw, Denice Bors, MD  SODIUM FLUORIDE 5000 PPM 1.1 % PSTE 1 application by Mouth Rinse route as directed.  03/10/19  Yes [provider]    Inpatient Medications: Scheduled Meds:  [MAR Hold] Chlorhexidine Gluconate Cloth  6 each Topical Daily   [MAR Hold] metoprolol succinate  25 mg Oral Q12H   [MAR Hold] nicotine  21 mg Transdermal Daily   [MAR Hold] pantoprazole  40 mg Oral BID   [MAR Hold] sodium chloride flush  3 mL Intravenous Once   [MAR Hold] sodium chloride flush  3 mL Intravenous Q12H   Continuous Infusions:  [MAR Hold] sodium chloride     lactated ringers 20 mL/hr at 03/31/19 1231   PRN Meds: [MAR Hold] sodium chloride, [MAR Hold] acetaminophen, [MAR Hold] dextromethorphan, [MAR Hold] ipratropium-albuterol, [MAR Hold] ondansetron (ZOFRAN) IV, [MAR Hold] sodium chloride flush  Allergies:    Allergies  Allergen Reactions   Nickel     Rash blisters   Sulfa Antibiotics      Blisters  Rash    Tape     Slight skin irritation    Social History:   Social History   Socioeconomic History   Marital status: Married    Spouse name: Not on file   Number of children: Not on file   Years of education: Not on file   Highest education level: Not on file  Occupational History   Occupation: hospice  Tobacco Use   Smoking status: Former Smoker    Packs/day: 1.00    Years: 45.00    Pack years: 45.00    Types: Cigarettes    Quit date: 07/04/2011    Years since quitting: 7.7   Smokeless tobacco: Never Used  Substance and Sexual Activity   Alcohol use: Yes    Alcohol/week:  1.0 standard drinks    Types: 1 Standard drinks or equivalent per week    Comment: rare   Drug use: Not on file   Sexual activity: Yes    Birth control/protection: Post-menopausal  Other Topics Concern   Not on file  Social History Narrative   Not on file   Social Determinants of Health   Financial Resource Strain:    Difficulty of Paying Living Expenses: Not on file  Food Insecurity:    Worried About Charity fundraiser in the Last Year: Not on file   [employer] of Food in the Last Year: Not on file  Transportation Needs:    Lack of Transportation (Medical): Not on file   Lack of Transportation (Non-Medical): Not on file  Physical Activity:    Days of Exercise per Week: Not on file   Minutes of Exercise per Session: Not on file  Stress:    Feeling of Stress : Not on file  Social Connections:    Frequency of Communication with Friends and Family: Not on file   Frequency of Social Gatherings with Friends and Family: Not on file   Attends Religious Services: Not on file   Active Member of Clubs or Organizations: Not on file   Attends Archivist Meetings: Not on file   Marital Status: Not on file  Intimate Partner Violence:    Fear of Current or Ex-Partner: Not on file   Emotionally Abused: Not on file   Physically Abused: Not on file   Sexually Abused: Not on file      Family History:    Family History  Problem Relation Age of Onset   Stroke Father    Heart attack Mother      ROS:  Please see the history of present illness.   All other ROS reviewed and negative.     Physical Exam/Data:   Vitals:   03/31/19 0731 03/31/19 0825 03/31/19 0947 03/31/19 1223  BP:  (!) 108/56 (!) 115/54 127/75  Pulse:  70 96 73  Resp:  16 19 19   Temp:  98 F (36.7 C) 98.4 F (36.9 C)   TempSrc:  Oral Oral   SpO2: 97% 97% 94% 98%  Weight:   77.5 kg   Height:   5\' 2"  (1.575 m)     Intake/Output Summary (Last 24 hours) at 03/31/2019 1321 Last data filed at 03/31/2019 0600 Gross per 24 hour  Intake --  Output 1000 ml  Net -1000 ml   Last 3 Weights 03/31/2019 03/30/2019 04/15/2018  Weight (lbs) 170 lb 12.8 oz 170 lb 12.8 oz 159 lb  Weight (kg) 77.474 kg 77.474 kg 72.122 kg     Body mass index is 31.24 kg/m.  General:  Well nourished, well developed, in no acute distress HEENT: normal Lymph: no adenopathy Neck: no JVD Endocrine:  No thryomegaly Vascular: No carotid bruits; FA pulses 2+ bilaterally without bruits  Cardiac:  normal S1, S2; RRR; 3/6 systolic murmur Lungs:  clear to auscultation bilaterally, no wheezing, rhonchi or rales  Abd: soft, nontender, no hepatomegaly  Ext: no edema Musculoskeletal:  No deformities, BUE and BLE strength normal and equal Skin: warm and dry  Neuro:  CNs 2-12 intact, no focal abnormalities noted Psych:  Normal affect   EKG:  The EKG was personally reviewed and demonstrates: SR with biphasic TW in inferolateral leads  Relevant CV Studies:  TTE: 03/30/19   IMPRESSIONS      1. Left ventricular ejection fraction,  by visual estimation, is 60 to 65%. The left ventricle has normal function. There is mildly increased left ventricular hypertrophy.  2. Left ventricular diastolic parameters are consistent with Grade II diastolic dysfunction (pseudonormalization).  3. Elevated left atrial pressure.  4. Global right  ventricle has normal systolic function.The right ventricular size is normal.  5. Left atrial size was severely dilated.  6. Right atrial size was mildly dilated.  7. Moderate mitral annular calcification.  8. The mitral valve is abnormal. Mild mitral valve regurgitation.  9. The tricuspid valve is normal in structure. Tricuspid valve regurgitation is mild. 10. The pulmonic valve was not well visualized. Pulmonic valve regurgitation is not visualized. 11. The tricuspid regurgitant velocity is 3.07 m/s, and with an assumed right atrial pressure of 15 mmHg, the estimated right ventricular systolic pressure is moderately elevated at 52.7 mmHg. 12. The inferior vena cava is dilated in size with <50% respiratory variability, suggesting right atrial pressure of 15 mmHg. 13. 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 Consider gated CT to evaluate for stability 14. S/p Bioprosthetic aortic valve replacement. No regurgitation seen. Severe bioprosthetic stenosis (Vmax 4.9 m/s, MG 60 mmHg, EOA 0.5 cm^2, DVI 0.27, AT 148 ms). Compared to prior TTE on 02/05/17, there is a marked increase in mean gradients (20mmHg to  62mmHg).  Laboratory Data:  High Sensitivity Troponin:   Recent Labs  Lab 03/30/19 0900 03/30/19 1130  TROPONINIHS 20* 24*     Chemistry Recent Labs  Lab 03/30/19 0900 03/31/19 0310  NA 135 139  K 4.7 4.2  CL 101 100  CO2 27 30  GLUCOSE 113* 98  BUN 13 12  CREATININE 0.82 0.88  CALCIUM 8.8* 8.8*  GFRNONAA >60 >60  GFRAA >60 >60  ANIONGAP 7 9    No results for input(s): PROT, ALBUMIN, AST, ALT, ALKPHOS, BILITOT in the last 168 hours. Hematology Recent Labs  Lab 03/30/19 0900 03/30/19 1600  WBC 9.6 9.4  RBC 3.40* 3.27*  HGB 9.6* 9.2*  HCT 31.5* 30.2*  MCV 92.6 92.4  MCH 28.2 28.1  MCHC 30.5 30.5  RDW 14.4 14.5  PLT 311 297   BNP Recent Labs  Lab 03/30/19 0900  BNP 617.2*    DDimer No results for input(s): DDIMER in the last 168  hours.   Radiology/Studies:  DG Chest Portable 1 View  Result Date: 03/30/2019 CLINICAL DATA:  Shortness of breath, hypoxia EXAM: PORTABLE CHEST 1 VIEW COMPARISON:  Chest radiograph dated 07/28/2011 FINDINGS: The heart is enlarged. Vascular calcifications are seen in the aortic arch. Median sternotomy wires and an aortic valve prosthesis are redemonstrated. The lungs are clear. There is no pleural effusion or pneumothorax. Degenerative changes are seen in the spine. IMPRESSION: Cardiomegaly.  No acute cardiopulmonary findings. Aortic Atherosclerosis (ICD10-I70.0). Electronically Signed   By: Zerita Boers M.D.   On: 03/30/2019 10:34   ECHOCARDIOGRAM COMPLETE  Result Date: 03/30/2019   ECHOCARDIOGRAM REPORT   Patient Name:   Terri Edwards Date of Exam: 03/30/2019 Medical Rec #:  [id number]          Height:       62.0 in Accession #:    [id number]         Weight:       159.0 lb Date of Birth:  June 29, 1950          BSA:          1.73 m Patient Age:    25 years  BP:           114/52 mmHg Patient Gender: F                  HR:           74 bpm. Exam Location:  Inpatient Procedure: 2D Echo Indications:    cardiomegaly 429.3  History:        Patient has prior history of Echocardiogram examinations, most                 recent 02/05/2017. Signs/Symptoms:Dyspnea; Risk                 Factors:Hypertension, Current Smoker and Sleep Apnea. Aortic                 Valve: A  Sonographer:    Johny Chess Referring Phys: [id number] RONDELL A SMITH IMPRESSIONS  1. Left ventricular ejection fraction, by visual estimation, is 60 to 65%. The left ventricle has normal function. There is mildly increased left ventricular hypertrophy.  2. Left ventricular diastolic parameters are consistent with Grade II diastolic dysfunction (pseudonormalization).  3. Elevated left atrial pressure.  4. Global right ventricle has normal systolic function.The right ventricular size is normal.  5. Left atrial size was severely dilated.   6. Right atrial size was mildly dilated.  7. Moderate mitral annular calcification.  8. The mitral valve is abnormal. Mild mitral valve regurgitation.  9. The tricuspid valve is normal in structure. Tricuspid valve regurgitation is mild. 10. The pulmonic valve was not well visualized. Pulmonic valve regurgitation is not visualized. 11. The tricuspid regurgitant velocity is 3.07 m/s, and with an assumed right atrial pressure of 15 mmHg, the estimated right ventricular systolic pressure is moderately elevated at 52.7 mmHg. 12. The inferior vena cava is dilated in size with <50% respiratory variability, suggesting right atrial pressure of 15 mmHg. 13. 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 Consider gated CT to evaluate for stability 14. S/p Bioprosthetic aortic valve replacement. No regurgitation seen. Severe bioprosthetic stenosis (Vmax 4.9 m/s, MG 60 mmHg, EOA 0.5 cm^2, DVI 0.27, AT 148 ms). Compared to prior TTE on 02/05/17, there is a marked increase in mean gradients (30mmHg to  9mmHg). FINDINGS  Left Ventricle: Left ventricular ejection fraction, by visual estimation, is 60 to 65%. The left ventricle has normal function. The left ventricle has no regional wall motion abnormalities. There is mildly increased left ventricular hypertrophy. Left ventricular diastolic parameters are consistent with Grade II diastolic dysfunction (pseudonormalization). Elevated left atrial pressure. Right Ventricle: The right ventricular size is normal. No increase in right ventricular wall thickness. Global RV systolic function is has normal systolic function. The tricuspid regurgitant velocity is 3.07 m/s, and with an assumed right atrial pressure  of 15 mmHg, the estimated right ventricular systolic pressure is moderately elevated at 52.7 mmHg. Left Atrium: Left atrial size was severely dilated. Right Atrium: Right atrial size was mildly dilated Pericardium: Trivial pericardial effusion is present.  Mitral Valve: The mitral valve is abnormal. There is mild thickening of the mitral valve leaflet(s). Moderate mitral annular calcification. Mild mitral valve regurgitation. Tricuspid Valve: The tricuspid valve is normal in structure. Tricuspid valve regurgitation is mild. Aortic Valve: The aortic valve has been repaired/replaced. Aortic valve regurgitation is not visualized. Aortic valve mean gradient measures 50.0 mmHg. Aortic valve peak gradient measures 79.9 mmHg. Aortic valve area, by VTI measures 0.65 cm. Bioprosthetic aortic valve replacement. No regurgitation seen. Severe bioprosthetic stenosis (Vmax  4.9 m/s, MG 60 mmHg, EOA 0.7 cm^2, DVI 0.27). Pulmonic Valve: The pulmonic valve was not well visualized. Pulmonic valve regurgitation is not visualized. Pulmonic regurgitation is not visualized. Aorta: The aortic root is normal in size and structure. Venous: The inferior vena cava is dilated in size with less than 50% respiratory variability, suggesting right atrial pressure of 15 mmHg. IAS/Shunts: The atrial septum is grossly normal.  LEFT VENTRICLE PLAX 2D LVIDd:         5.10 cm  Diastology LVIDs:         3.60 cm  LV e' lateral:   8.81 cm/s LV PW:         1.00 cm  LV E/e' lateral: 15.3 LV IVS:        0.90 cm  LV e' medial:    7.94 cm/s LVOT diam:     1.50 cm  LV E/e' medial:  17.0 LV SV:         69 ml LV SV Index:   38.54 LVOT Area:     1.77 cm  RIGHT VENTRICLE RV S prime:     10.30 cm/s TAPSE (M-mode): 1.7 cm LEFT ATRIUM              Index       RIGHT ATRIUM           Index LA diam:        5.10 cm  2.94 cm/m  RA Area:     19.40 cm LA Vol (A2C):   97.1 ml  55.99 ml/m RA Volume:   52.10 ml  30.04 ml/m LA Vol (A4C):   101.0 ml 58.24 ml/m LA Biplane Vol: 101.0 ml 58.24 ml/m  AORTIC VALVE AV Area (Vmax):    0.68 cm AV Area (Vmean):   0.63 cm AV Area (VTI):     0.65 cm AV Vmax:           447.00 cm/s AV Vmean:          334.000 cm/s AV VTI:            1.150 m AV Peak Grad:      79.9 mmHg AV Mean Grad:       50.0 mmHg LVOT Vmax:         172.00 cm/s LVOT Vmean:        119.750 cm/s LVOT VTI:          0.424 m LVOT/AV VTI ratio: 0.37  AORTA Ao Root diam: 2.70 cm MITRAL VALVE                         TRICUSPID VALVE MV Area (PHT): 4.39 cm              TR Peak grad:   37.7 mmHg MV PHT:        50.17 msec            TR Vmax:        307.00 cm/s MV Decel Time: 173 msec MV E velocity: 135.00 cm/s 103 cm/s  SHUNTS MV A velocity: 78.50 cm/s  70.3 cm/s Systemic VTI:  0.42 m MV E/A ratio:  1.72        1.5       Systemic Diam: 1.50 cm  Oswaldo Milian MD Electronically signed by Oswaldo Milian MD Signature Date/Time: 03/30/2019/10:41:40 PM    Final    Assessment and Plan:   Terri Edwards is a 69 y.o. female with  a hx of aortic stenosis s/p AVR 06/2011, PAF, HTN, PE, GERD and pseudoaneurysm of the LVOT who is being seen today for the evaluation of anticoagulation in the setting of GI bleed at the request of Dr. Tamala Julian.  1. Aortic stenosis s/p AVR '13: echo this admission showed worsening mean gradient from 49mmHg to 2mmHg since echo from 2018. Suspect this may be contributing to her dyspnea.  -- will plan for [facility] tomorrow as Eliquis has been held. Suspect will need TCTS consult.   -- The patient understands that risks included but are not limited to stroke (1 in 1000), death (1 in 11), kidney failure [usually temporary] (1 in 500), bleeding (1 in 200), allergic reaction [possibly serious] (1 in 200).    2. Anemia with melena: Hgb 12>>9.6. Underwent EGD with GI showing hiatal hernia and non- bleeding gastric ulcer. Recommendations for daily PPI and would need a colonoscopy but did not proceed as she became hypotensive during EGD.   3. PAF: has been on coumadin but recently switched to Eliquis back 11/20. Currently held in the setting of GI bleeding.   4. Hx of PE: as above, transitioned to Eliquis. Currently held.   5. Pseudoaneurysm of LVOT: has been seen by Dr. Roxy Manns back 03/2018 and patient elected not  to have surgery at that time. Planned for follow up in 6 months but deferred as a result of COVID.   6. Tobacco abuse: cessation advised.  For questions or updates, please contact [place] Please consult [email or web address] for contact info under     Signed, Reino Bellis, NP  03/31/2019 1:21 PM  Agree with note by Reino Bellis NP-C  Pleasant 69 year old married Caucasian female patient of Dr. Jacalyn Lefevre and Dr. Guy Sandifer  admitted with symptomatic shortness of breath and anemia.  She is a history of bioprosthetic aortic valve replacement 07/05/2011.  Other problems as outlined.  She has had progressive decline in her hemoglobin after switching from Coumadin to Eliquis which she was on for PAF, pulmonary embolism and DVT.  She just underwent endoscopy today which is complicated by hypotension.  Her 2D echo recently performed shows increase in prosthetic valve stenosis now with a peak gradient of 60 mmHg (0.5 cm).  There is also a pseudoaneurysm arising rising subvalvular potentially from the noncoronary cusp which has been followed by Dr. Roxy Manns .  He saw her a year ago and reck recommended surgical repair at that time since it had grown since the original CT scan done at the time of surgery.  On exam she has a typical outflow tract murmur aortic stenosis as well as bruits in her carotids which may be transmitted murmur.  Based on her symptoms and her echo results I have recommended that we proceed with right left heart cath tomorrow (radial/brachial).  I have made Dr. Roxy Manns  aware so that he can see her tomorrow as well.  Lorretta Harp, M.D., [place], [facility], [place] [address] [place], [place]  [address]  [phone] 03/31/2019 4:22 PM

## 2019-04-01 ENCOUNTER — Encounter (HOSPITAL_COMMUNITY)
Admission: EM | Disposition: A | Discharge status: Home / Self Care | Payer: Self-pay | Source: Home / Self Care | Attending: Internal Medicine

## 2019-04-01 ENCOUNTER — Telehealth: Payer: Self-pay | Admitting: *Deleted

## 2019-04-01 DIAGNOSIS — I350 Nonrheumatic aortic (valve) stenosis: Secondary | ICD-10-CM

## 2019-04-01 HISTORY — PX: RIGHT HEART CATH AND CORONARY ANGIOGRAPHY: CATH118264

## 2019-04-01 LAB — POCT I-STAT 7, (LYTES, BLD GAS, ICA,H+H)
Acid-Base Excess: 5 mmol/L — ABNORMAL HIGH (ref 0.0–2.0)
Bicarbonate: 31.2 mmol/L — ABNORMAL HIGH (ref 20.0–28.0)
Calcium, Ion: 1.2 mmol/L (ref 1.15–1.40)
HCT: 28 % — ABNORMAL LOW (ref 36.0–46.0)
Hemoglobin: 9.5 g/dL — ABNORMAL LOW (ref 12.0–15.0)
O2 Saturation: 99 %
Potassium: 4.1 mmol/L (ref 3.5–5.1)
Sodium: 140 mmol/L (ref 135–145)
TCO2: 33 mmol/L — ABNORMAL HIGH (ref 22–32)
pCO2 arterial: 57.3 mmHg — ABNORMAL HIGH (ref 32.0–48.0)
pH, Arterial: 7.344 — ABNORMAL LOW (ref 7.350–7.450)
pO2, Arterial: 129 mmHg — ABNORMAL HIGH (ref 83.0–108.0)

## 2019-04-01 LAB — CBC
HCT: 27.7 % — ABNORMAL LOW (ref 36.0–46.0)
Hemoglobin: 8.6 g/dL — ABNORMAL LOW (ref 12.0–15.0)
MCH: 28 pg (ref 26.0–34.0)
MCHC: 31 g/dL (ref 30.0–36.0)
MCV: 90.2 fL (ref 80.0–100.0)
Platelets: 279 10*3/uL (ref 150–400)
RBC: 3.07 MIL/uL — ABNORMAL LOW (ref 3.87–5.11)
RDW: 14.4 % (ref 11.5–15.5)
WBC: 9 10*3/uL (ref 4.0–10.5)
nRBC: 0 % (ref 0.0–0.2)

## 2019-04-01 LAB — POCT I-STAT EG7
Acid-Base Excess: 4 mmol/L — ABNORMAL HIGH (ref 0.0–2.0)
Bicarbonate: 30.7 mmol/L — ABNORMAL HIGH (ref 20.0–28.0)
Calcium, Ion: 1.14 mmol/L — ABNORMAL LOW (ref 1.15–1.40)
HCT: 27 % — ABNORMAL LOW (ref 36.0–46.0)
Hemoglobin: 9.2 g/dL — ABNORMAL LOW (ref 12.0–15.0)
O2 Saturation: 74 %
Potassium: 3.9 mmol/L (ref 3.5–5.1)
Sodium: 142 mmol/L (ref 135–145)
TCO2: 32 mmol/L (ref 22–32)
pCO2, Ven: 58.4 mmHg (ref 44.0–60.0)
pH, Ven: 7.329 (ref 7.250–7.430)
pO2, Ven: 43 mmHg (ref 32.0–45.0)

## 2019-04-01 LAB — BASIC METABOLIC PANEL
Anion gap: 8 (ref 5–15)
BUN: 17 mg/dL (ref 8–23)
CO2: 28 mmol/L (ref 22–32)
Calcium: 8.5 mg/dL — ABNORMAL LOW (ref 8.9–10.3)
Chloride: 102 mmol/L (ref 98–111)
Creatinine, Ser: 1.2 mg/dL — ABNORMAL HIGH (ref 0.44–1.00)
GFR calc Af Amer: 53 mL/min — ABNORMAL LOW (ref >60)
GFR calc non Af Amer: 46 mL/min — ABNORMAL LOW (ref >60)
Glucose, Bld: 93 mg/dL (ref 70–99)
Potassium: 4.1 mmol/L (ref 3.5–5.1)
Sodium: 138 mmol/L (ref 135–145)

## 2019-04-01 LAB — VITAMIN B12: Vitamin B-12: 162 pg/mL — ABNORMAL LOW (ref 180–914)

## 2019-04-01 LAB — RETICULOCYTES
Immature Retic Fract: 15.2 % (ref 2.3–15.9)
RBC.: 3.24 MIL/uL — ABNORMAL LOW (ref 3.87–5.11)
Retic Count, Absolute: 91 10*3/uL (ref 19.0–186.0)
Retic Ct Pct: 3.1 % (ref 0.4–3.1)

## 2019-04-01 LAB — IRON AND TIBC
Iron: 12 ug/dL — ABNORMAL LOW (ref 28–170)
Saturation Ratios: 3 % — ABNORMAL LOW (ref 10.4–31.8)
TIBC: 423 ug/dL (ref 250–450)
UIBC: 411 ug/dL

## 2019-04-01 LAB — FOLATE: Folate: 20.3 ng/mL (ref >5.9)

## 2019-04-01 LAB — FERRITIN: Ferritin: 9 ng/mL — ABNORMAL LOW (ref 11–307)

## 2019-04-01 SURGERY — RIGHT HEART CATH AND CORONARY ANGIOGRAPHY
Anesthesia: LOCAL

## 2019-04-01 MED ORDER — FERROUS SULFATE 325 (65 FE) MG PO TABS: 325.0000 mg | ORAL_TABLET | Freq: Two times a day (BID) | ORAL | 1 refills | Status: AC

## 2019-04-01 MED ORDER — HEPARIN SODIUM (PORCINE) 1000 UNIT/ML IJ SOLN
INTRAMUSCULAR | Status: AC
  Filled 2019-04-01: qty 1

## 2019-04-01 MED ORDER — VERAPAMIL HCL 2.5 MG/ML IV SOLN
INTRAVENOUS | Status: DC | PRN
  Administered 2019-04-01: 08:00:00 10 mL via INTRA_ARTERIAL

## 2019-04-01 MED ORDER — FENTANYL CITRATE (PF) 100 MCG/2ML IJ SOLN
INTRAMUSCULAR | Status: DC | PRN
  Administered 2019-04-01 (×2): 25 ug via INTRAVENOUS

## 2019-04-01 MED ORDER — HEPARIN SODIUM (PORCINE) 1000 UNIT/ML IJ SOLN
INTRAMUSCULAR | Status: DC | PRN
  Administered 2019-04-01: 3500 [IU] via INTRAVENOUS

## 2019-04-01 MED ORDER — HEPARIN (PORCINE) IN NACL 1000-0.9 UT/500ML-% IV SOLN
INTRAVENOUS | Status: DC | PRN
  Administered 2019-04-01 (×2): 500 mL

## 2019-04-01 MED ORDER — MIDAZOLAM HCL 2 MG/2ML IJ SOLN
INTRAMUSCULAR | Status: DC | PRN
  Administered 2019-04-01 (×2): 1 mg via INTRAVENOUS

## 2019-04-01 MED ORDER — FENTANYL CITRATE (PF) 100 MCG/2ML IJ SOLN
INTRAMUSCULAR | Status: AC
  Filled 2019-04-01: qty 2

## 2019-04-01 MED ORDER — HEPARIN (PORCINE) IN NACL 1000-0.9 UT/500ML-% IV SOLN
INTRAVENOUS | Status: AC
  Filled 2019-04-01: qty 1000

## 2019-04-01 MED ORDER — LIDOCAINE HCL (PF) 1 % IJ SOLN
INTRAMUSCULAR | Status: AC
  Filled 2019-04-01: qty 30

## 2019-04-01 MED ORDER — SODIUM CHLORIDE 0.9 % IV SOLN
510.0000 mg | Freq: Once | INTRAVENOUS | Status: AC
  Administered 2019-04-01: 510 mg via INTRAVENOUS
  Filled 2019-04-01 (×2): qty 17

## 2019-04-01 MED ORDER — LIDOCAINE HCL (PF) 1 % IJ SOLN
INTRAMUSCULAR | Status: DC | PRN
  Administered 2019-04-01 (×2): 2 mL via INTRADERMAL

## 2019-04-01 MED ORDER — IOHEXOL 350 MG/ML SOLN
INTRAVENOUS | Status: DC | PRN
  Administered 2019-04-01: 80 mL

## 2019-04-01 MED ORDER — MIDAZOLAM HCL 2 MG/2ML IJ SOLN
INTRAMUSCULAR | Status: AC
  Filled 2019-04-01: qty 2

## 2019-04-01 MED ORDER — VERAPAMIL HCL 2.5 MG/ML IV SOLN
INTRAVENOUS | Status: AC
  Filled 2019-04-01: qty 2

## 2019-04-01 MED ORDER — SODIUM CHLORIDE 0.9 % IV SOLN: INTRAVENOUS | Status: AC

## 2019-04-01 SURGICAL SUPPLY — 11 items

## 2019-04-01 NOTE — Progress Notes (Signed)
Pt desaturated to 85 after she ambulated to the bathroom. After 5 minutes pt SpO2 88% on room air.  Pt placed on nasal cannula 2L and SpO2 92 %. No distress noted, pt denied SOB, dizziness, chest pain. Will continue to monitor pt.

## 2019-04-01 NOTE — Progress Notes (Signed)
[facility] BAND REMOVAL  LOCATION:    right radial  DEFLATED PER PROTOCOL:    Yes.    TIME BAND OFF / DRESSING APPLIED:     1140 SITE UPON ARRIVAL:    Level 0  SITE AFTER BAND REMOVAL:    Level 0  CIRCULATION SENSATION AND MOVEMENT:    Within Normal Limits   Yes.    COMMENTS:   R wrist is level zero.

## 2019-04-01 NOTE — Telephone Encounter (Signed)
-----   Message from Jerene Bears, MD sent at 04/01/2019  3:17 PM EST ----- Office follow up with me or APP in 4-6 weeks IDA, gastric ulcer.  Will need repeat EGD and maybe colon (if she is agreeable) Thanks JMP

## 2019-04-01 NOTE — Progress Notes (Signed)
TCTS BRIEF PROGRESS NOTE  Day of Surgery  S/P Procedure(s) (LRB): RIGHT HEART CATH AND CORONARY ANGIOGRAPHY (N/A)    Patient is well-known to me from her previous surgery and multiple outpatient follow-up visits.  Please see her most recent surgical consult note dated April 15, 2018.  I have personally reviewed the patient's recent history, transthoracic echocardiogram, and diagnostic cardiac catheterization.  She needs redo aortic valve replacement but this should be postponed until her active gastric ulcer has healed.  Her previous episodes of atrial fibrillation and very small pulmonary embolus all occurred during the early postoperative phase following her surgery in 2013.  To my knowledge she has not had any recurrence of atrial fibrillation since then, which both the patient and her husband confirm.  Under the circumstances I favor stopping long-term anticoagulation at least temporarily.  We will plan to see the patient back in our office in approximately 1 month as long as she has been cleared by gastroenterology.  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  We can make arrangements at the time of her visit in our office if it has not already been completed.   I spent in excess of 15 minutes during the conduct of this hospital encounter and >50% of this time involved direct face-to-face encounter with the patient for counseling and/or coordination of their care.     Rexene Alberts, MD 04/01/2019 4:11 PM

## 2019-04-01 NOTE — Progress Notes (Addendum)
Progress Note  Patient Name: Terri Edwards Date of Encounter: 04/01/2019  Primary Cardiologist: Kirk Ruths, MD   Subjective   Status post right left heart cath by Dr. Angelena Form this morning revealing essentially normal coronary arteries with excellent cardiac output.  Inpatient Medications    Scheduled Meds: . [MAR Hold] Chlorhexidine Gluconate Cloth  6 each Topical Daily  . [MAR Hold] metoprolol succinate  25 mg Oral Q12H  . [MAR Hold] nicotine  21 mg Transdermal Daily  . [MAR Hold] pantoprazole  40 mg Oral BID  . [MAR Hold] sodium chloride flush  3 mL Intravenous Once  . [MAR Hold] sodium chloride flush  3 mL Intravenous Q12H  . [MAR Hold] sodium chloride flush  3 mL Intravenous Q12H   Continuous Infusions: . [MAR Hold] sodium chloride    . sodium chloride    . sodium chloride    . sodium chloride     PRN Meds: [MAR Hold] sodium chloride, sodium chloride, [MAR Hold] acetaminophen, [MAR Hold] dextromethorphan, [MAR Hold] ipratropium-albuterol, [MAR Hold] ondansetron (ZOFRAN) IV, [MAR Hold] sodium chloride flush, sodium chloride flush   Vital Signs    Vitals:   04/01/19 0844 04/01/19 0849 04/01/19 0854 04/01/19 0906  BP: (!) 111/51 (!) 108/50  (!) 103/43  Pulse: 70 (!) 0 (!) 0 (!) 53  Resp: 19 (!) 31 (!) 0 (!) 21  Temp:      TempSrc:      SpO2: 97% 93% (!) 0%   Weight:      Height:        Intake/Output Summary (Last 24 hours) at 04/01/2019 1043 Last data filed at 03/31/2019 1423 Gross per 24 hour  Intake 100 ml  Output --  Net 100 ml   Last 3 Weights 04/01/2019 03/31/2019 03/30/2019  Weight (lbs) 167 lb 5.3 oz 170 lb 12.8 oz 170 lb 12.8 oz  Weight (kg) 75.9 kg 77.474 kg 77.474 kg      Telemetry    Sinus rhythm- Personally Reviewed  ECG    Not performed today- Personally Reviewed  Physical Exam   GEN: No acute distress.   Neck: No JVD Cardiac: RRR, 2/6 outflow tract murmur, rubs, or gallops.  Respiratory: Clear to auscultation  bilaterally. GI: Soft, nontender, non-distended  MS: No edema; No deformity. Neuro:  Nonfocal  Psych: Normal affect   Labs    High Sensitivity Troponin:   Recent Labs  Lab 03/30/19 0900 03/30/19 1130  TROPONINIHS 20* 24*      Chemistry Recent Labs  Lab 03/30/19 0900 03/31/19 0310 04/01/19 0224  NA 135 139 138  K 4.7 4.2 4.1  CL 101 100 102  CO2 27 30 28   GLUCOSE 113* 98 93  BUN 13 12 17   CREATININE 0.82 0.88 1.20*  CALCIUM 8.8* 8.8* 8.5*  GFRNONAA >60 >60 46*  GFRAA >60 >60 53*  ANIONGAP 7 9 8      Hematology Recent Labs  Lab 03/30/19 1600 03/31/19 1506 04/01/19 0224  WBC 9.4 8.4 9.0  RBC 3.27* 3.34* 3.07*  HGB 9.2* 9.2* 8.6*  HCT 30.2* 30.3* 27.7*  MCV 92.4 90.7 90.2  MCH 28.1 27.5 28.0  MCHC 30.5 30.4 31.0  RDW 14.5 14.3 14.4  PLT 297 291 279    BNP Recent Labs  Lab 03/30/19 0900  BNP 617.2*     DDimer No results for input(s): DDIMER in the last 168 hours.   Radiology    CARDIAC CATHETERIZATION  Result Date: 04/01/2019 1. No angiographic evidence of  CAD Surgical consultation for discussion regarding redo AVR and repair of LVOT false aneurysm.   ECHOCARDIOGRAM COMPLETE  Result Date: 03/30/2019   ECHOCARDIOGRAM REPORT   Patient Name:   Terri Edwards Date of Exam: 03/30/2019 Medical Rec #:  [id number]          Height:       62.0 in Accession #:    [id number]         Weight:       159.0 lb Date of Birth:  04-09-1950          BSA:          1.73 m Patient Age:    69 years           BP:           114/52 mmHg Patient Gender: F                  HR:           74 bpm. Exam Location:  Inpatient Procedure: 2D Echo Indications:    cardiomegaly 429.3  History:        Patient has prior history of Echocardiogram examinations, most                 recent 02/05/2017. Signs/Symptoms:Dyspnea; Risk                 Factors:Hypertension, Current Smoker and Sleep Apnea. Aortic                 Valve: A  Sonographer:    Johny Chess Referring Phys: [id number] RONDELL A  SMITH IMPRESSIONS  1. Left ventricular ejection fraction, by visual estimation, is 60 to 65%. The left ventricle has normal function. There is mildly increased left ventricular hypertrophy.  2. Left ventricular diastolic parameters are consistent with Grade II diastolic dysfunction (pseudonormalization).  3. Elevated left atrial pressure.  4. Global right ventricle has normal systolic function.The right ventricular size is normal.  5. Left atrial size was severely dilated.  6. Right atrial size was mildly dilated.  7. Moderate mitral annular calcification.  8. The mitral valve is abnormal. Mild mitral valve regurgitation.  9. The tricuspid valve is normal in structure. Tricuspid valve regurgitation is mild. 10. The pulmonic valve was not well visualized. Pulmonic valve regurgitation is not visualized. 11. The tricuspid regurgitant velocity is 3.07 m/s, and with an assumed right atrial pressure of 15 mmHg, the estimated right ventricular systolic pressure is moderately elevated at 52.7 mmHg. 12. The inferior vena cava is dilated in size with <50% respiratory variability, suggesting right atrial pressure of 15 mmHg. 13. 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 Consider gated CT to evaluate for stability 14. S/p Bioprosthetic aortic valve replacement. No regurgitation seen. Severe bioprosthetic stenosis (Vmax 4.9 m/s, MG 60 mmHg, EOA 0.5 cm^2, DVI 0.27, AT 148 ms). Compared to prior TTE on 02/05/17, there is a marked increase in mean gradients (53mmHg to  57mmHg). FINDINGS  Left Ventricle: Left ventricular ejection fraction, by visual estimation, is 60 to 65%. The left ventricle has normal function. The left ventricle has no regional wall motion abnormalities. There is mildly increased left ventricular hypertrophy. Left ventricular diastolic parameters are consistent with Grade II diastolic dysfunction (pseudonormalization). Elevated left atrial pressure. Right Ventricle: The right ventricular  size is normal. No increase in right ventricular wall thickness. Global RV systolic function is has normal systolic function. The tricuspid regurgitant velocity is 3.07 m/s, and with  an assumed right atrial pressure  of 15 mmHg, the estimated right ventricular systolic pressure is moderately elevated at 52.7 mmHg. Left Atrium: Left atrial size was severely dilated. Right Atrium: Right atrial size was mildly dilated Pericardium: Trivial pericardial effusion is present. Mitral Valve: The mitral valve is abnormal. There is mild thickening of the mitral valve leaflet(s). Moderate mitral annular calcification. Mild mitral valve regurgitation. Tricuspid Valve: The tricuspid valve is normal in structure. Tricuspid valve regurgitation is mild. Aortic Valve: The aortic valve has been repaired/replaced. Aortic valve regurgitation is not visualized. Aortic valve mean gradient measures 50.0 mmHg. Aortic valve peak gradient measures 79.9 mmHg. Aortic valve area, by VTI measures 0.65 cm. Bioprosthetic aortic valve replacement. No regurgitation seen. Severe bioprosthetic stenosis (Vmax 4.9 m/s, MG 60 mmHg, EOA 0.7 cm^2, DVI 0.27). Pulmonic Valve: The pulmonic valve was not well visualized. Pulmonic valve regurgitation is not visualized. Pulmonic regurgitation is not visualized. Aorta: The aortic root is normal in size and structure. Venous: The inferior vena cava is dilated in size with less than 50% respiratory variability, suggesting right atrial pressure of 15 mmHg. IAS/Shunts: The atrial septum is grossly normal.  LEFT VENTRICLE PLAX 2D LVIDd:         5.10 cm  Diastology LVIDs:         3.60 cm  LV e' lateral:   8.81 cm/s LV PW:         1.00 cm  LV E/e' lateral: 15.3 LV IVS:        0.90 cm  LV e' medial:    7.94 cm/s LVOT diam:     1.50 cm  LV E/e' medial:  17.0 LV SV:         69 ml LV SV Index:   38.54 LVOT Area:     1.77 cm  RIGHT VENTRICLE RV S prime:     10.30 cm/s TAPSE (M-mode): 1.7 cm LEFT ATRIUM              Index        RIGHT ATRIUM           Index LA diam:        5.10 cm  2.94 cm/m  RA Area:     19.40 cm LA Vol (A2C):   97.1 ml  55.99 ml/m RA Volume:   52.10 ml  30.04 ml/m LA Vol (A4C):   101.0 ml 58.24 ml/m LA Biplane Vol: 101.0 ml 58.24 ml/m  AORTIC VALVE AV Area (Vmax):    0.68 cm AV Area (Vmean):   0.63 cm AV Area (VTI):     0.65 cm AV Vmax:           447.00 cm/s AV Vmean:          334.000 cm/s AV VTI:            1.150 m AV Peak Grad:      79.9 mmHg AV Mean Grad:      50.0 mmHg LVOT Vmax:         172.00 cm/s LVOT Vmean:        119.750 cm/s LVOT VTI:          0.424 m LVOT/AV VTI ratio: 0.37  AORTA Ao Root diam: 2.70 cm MITRAL VALVE                         TRICUSPID VALVE MV Area (PHT): 4.39 cm  TR Peak grad:   37.7 mmHg MV PHT:        50.17 msec            TR Vmax:        307.00 cm/s MV Decel Time: 173 msec MV E velocity: 135.00 cm/s 103 cm/s  SHUNTS MV A velocity: 78.50 cm/s  70.3 cm/s Systemic VTI:  0.42 m MV E/A ratio:  1.72        1.5       Systemic Diam: 1.50 cm  Oswaldo Milian MD Electronically signed by Oswaldo Milian MD Signature Date/Time: 03/30/2019/10:41:40 PM    Final     Cardiac Studies   2D echocardiogram (1/10/9)  IMPRESSIONS    1. Left ventricular ejection fraction, by visual estimation, is 60 to 65%. The left ventricle has normal function. There is mildly increased left ventricular hypertrophy.  2. Left ventricular diastolic parameters are consistent with Grade II diastolic dysfunction (pseudonormalization).  3. Elevated left atrial pressure.  4. Global right ventricle has normal systolic function.The right ventricular size is normal.  5. Left atrial size was severely dilated.  6. Right atrial size was mildly dilated.  7. Moderate mitral annular calcification.  8. The mitral valve is abnormal. Mild mitral valve regurgitation.  9. The tricuspid valve is normal in structure. Tricuspid valve regurgitation is mild. 10. The pulmonic valve was not well  visualized. Pulmonic valve regurgitation is not visualized. 11. The tricuspid regurgitant velocity is 3.07 m/s, and with an assumed right atrial pressure of 15 mmHg, the estimated right ventricular systolic pressure is moderately elevated at 52.7 mmHg. 12. The inferior vena cava is dilated in size with <50% respiratory variability, suggesting right atrial pressure of 15 mmHg. 13. 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 Consider gated CT to evaluate for stability 14. S/p Bioprosthetic aortic valve replacement. No regurgitation seen. Severe bioprosthetic stenosis (Vmax 4.9 m/s, MG 60 mmHg, EOA 0.5 cm^2, DVI 0.27, AT 148 ms). Compared to prior TTE on 02/05/17, there is a marked increase in mean gradients (56mmHg to  35mmHg).   Right left heart cath (04/01/2019)  Conclusion  1. No angiographic evidence of CAD  Surgical consultation for discussion regarding redo AVR and repair of LVOT false aneurysm.     Patient Profile      Terri Edwards is a 69 y.o. female with a hx of aortic stenosis s/p AVR 06/2011, PAF, HTN, PE, GERD and pseudoaneurysm of the LVOT who is being seen today for the evaluation of anticoagulation in the setting of GI bleed at the request of Dr. Tamala Julian  Assessment & Plan    1: Aortic stenosis-status post AVR in 2013 by Dr. Roxy Manns with a bioprosthetic valve.  She was on warfarin which was transitioned to Eliquis in the past because of A. fib, DVT and PE.  She had increasing dyspnea on exertion.  2D echo revealed progression of prosthetic valve aortic stenosis not to the moderate to severe level.  She also has a subvalvular aortic pseudoaneurysm which has been evaluated by Dr. Roxy Manns as well.  Right left heart cath today revealed excellent cardiac output with clean coronary arteries.  2: PAF-on Eliquis currently held because of GI bleeding.  Endoscopy showed nonbleeding gastric ulcers.  Her hemoglobin is stable at 8.6.  Will defer to the primary service  with regards to begin initiating anticoagulation.  Given recent GI bleed and endoscopic findings, and may be prudent to hold or discontinue anticoagulation at this time.  Dr. Roxy Manns has been notified of the patient's admission and cath result.  He will see her today to discuss her surgical options.  Dr. Dorian Pod had mentioned getting a gated cardiac CT for presurgical evaluation.  I will defer to him to order the appropriate test.  Otherwise, she is stable for discharge from our point of view, follow-up with Dr. Stanford Breed.  [facility] will sign off.   Medication Recommendations: None Other recommendations (labs, testing, etc): None Follow up as an outpatient: We will arrange outpatient follow-up with Dr. Stanford Breed.  For questions or updates, please contact Veteran Please consult [email or web address] for contact info under        Signed, Quay Burow, MD  04/01/2019, 10:43 AM

## 2019-04-01 NOTE — Progress Notes (Signed)
Daily Rounding Note  04/01/2019, 2:22 PM  LOS: 2 days   SUBJECTIVE:   Chief complaint:  IDA and b12 deficiency anemia.       OBJECTIVE:         Vital signs in last 24 hours:    Temp:  [97.5 F (36.4 C)-98.6 F (37 C)] 98.5 F (36.9 C) (01/12 1204) Pulse Rate:  [0-77] 66 (01/12 1204) Resp:  [0-85] 16 (01/12 1204) BP: (102-128)/(38-59) 109/51 (01/12 1204) SpO2:  [0 %-99 %] 91 % (01/12 1204) Weight:  [75.9 kg] 75.9 kg (01/12 0632)   Filed Weights   03/30/19 2057 03/31/19 0947 04/01/19 [id number]  Weight: 77.5 kg 77.5 kg 75.9 kg   Not re-examined  Intake/Output from previous day: 01/11 0701 - 01/12 0700 In: 100 [I.V.:100] Out: -   Intake/Output this shift: No intake/output data recorded.  Lab Results: Recent Labs    03/30/19 1600 03/31/19 1506 04/01/19 0224  WBC 9.4 8.4 9.0  HGB 9.2* 9.2* 8.6*  HCT 30.2* 30.3* 27.7*  PLT 297 291 279   BMET Recent Labs    03/30/19 0900 03/31/19 0310 04/01/19 0224  NA 135 139 138  K 4.7 4.2 4.1  CL 101 100 102  CO2 27 30 28   GLUCOSE 113* 98 93  BUN 13 12 17   CREATININE 0.82 0.88 1.20*  CALCIUM 8.8* 8.8* 8.5*    Studies/Results: CARDIAC CATHETERIZATION  Result Date: 04/01/2019  ECHOCARDIOGRAM COMPLETE  Result Date: 03/30/2019   ECHOCARDIOGRAM REPORT   Patient Name:   MANASA LAROCHE Date of Exam: 03/30/2019 Medical Rec #:  [id number]          Height:       62.0 in Accession #:    [id number]         Weight:       159.0 lb Date of Birth:  02/12/1951          BSA:          1.73 m Patient Age:    69 years           BP:           114/52 mmHg Patient Gender: F                  HR:           74 bpm. Exam Location:  Inpatient Procedure: 2D Echo Indications:    cardiomegaly 429.3  History:        Patient has prior history of Echocardiogram examinations, most                 recent 02/05/2017. Signs/Symptoms:Dyspnea; Risk                 Factors:Hypertension, Current Smoker  and Sleep Apnea. Aortic                 Valve: A  Sonographer:    Johny Chess Referring Phys: [id number] RONDELL A SMITH IMPRESSIONS  1. Left ventricular ejection fraction, by visual estimation, is 60 to 65%. The left ventricle has normal function. There is mildly increased left ventricular hypertrophy.  2. Left ventricular diastolic parameters are consistent with Grade II diastolic dysfunction (pseudonormalization).  3. Elevated left atrial pressure.  4. Global right ventricle has normal systolic function.The right ventricular size is normal.  5. Left atrial size was severely dilated.  6. Right atrial size was mildly dilated.  7. Moderate mitral annular calcification.  8. The mitral valve is abnormal. Mild mitral valve regurgitation.  9. The tricuspid valve is normal in structure. Tricuspid valve regurgitation is mild. 10. The pulmonic valve was not well visualized. Pulmonic valve regurgitation is not visualized. 11. The tricuspid regurgitant velocity is 3.07 m/s, and with an assumed right atrial pressure of 15 mmHg, the estimated right ventricular systolic pressure is moderately elevated at 52.7 mmHg. 12. The inferior vena cava is dilated in size with <50% respiratory variability, suggesting right atrial pressure of 15 mmHg. 13. 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 Consider gated CT to evaluate for stability 14. S/p Bioprosthetic aortic valve replacement. No regurgitation seen. Severe bioprosthetic stenosis (Vmax 4.9 m/s, MG 60 mmHg, EOA 0.5 cm^2, DVI 0.27, AT 148 ms). Compared to prior TTE on 02/05/17, there is a marked increase in mean gradients (44mmHg to  64mmHg). FINDINGS  Left Ventricle: Left ventricular ejection fraction, by visual estimation, is 60 to 65%. The left ventricle has normal function. The left ventricle has no regional wall motion abnormalities. There is mildly increased left ventricular hypertrophy. Left ventricular diastolic parameters are consistent with Grade  II diastolic dysfunction (pseudonormalization). Elevated left atrial pressure. Right Ventricle: The right ventricular size is normal. No increase in right ventricular wall thickness. Global RV systolic function is has normal systolic function. The tricuspid regurgitant velocity is 3.07 m/s, and with an assumed right atrial pressure  of 15 mmHg, the estimated right ventricular systolic pressure is moderately elevated at 52.7 mmHg. Left Atrium: Left atrial size was severely dilated. Right Atrium: Right atrial size was mildly dilated Pericardium: Trivial pericardial effusion is present. Mitral Valve: The mitral valve is abnormal. There is mild thickening of the mitral valve leaflet(s). Moderate mitral annular calcification. Mild mitral valve regurgitation. Tricuspid Valve: The tricuspid valve is normal in structure. Tricuspid valve regurgitation is mild. Aortic Valve: The aortic valve has been repaired/replaced. Aortic valve regurgitation is not visualized. Aortic valve mean gradient measures 50.0 mmHg. Aortic valve peak gradient measures 79.9 mmHg. Aortic valve area, by VTI measures 0.65 cm. Bioprosthetic aortic valve replacement. No regurgitation seen. Severe bioprosthetic stenosis (Vmax 4.9 m/s, MG 60 mmHg, EOA 0.7 cm^2, DVI 0.27). Pulmonic Valve: The pulmonic valve was not well visualized. Pulmonic valve regurgitation is not visualized. Pulmonic regurgitation is not visualized. Aorta: The aortic root is normal in size and structure. Venous: The inferior vena cava is dilated in size with less than 50% respiratory variability, suggesting right atrial pressure of 15 mmHg. IAS/Shunts: The atrial septum is grossly normal.   Electronically signed by Oswaldo Milian MD Signature Date/Time: 03/30/2019/10:41:40 PM    Final     ASSESMENT:   *   FOBT + stool, anemia.  Normocytic but iron, ferritin, B12 all low c/w IDA and B12 dificiency.   03/31/19 EGD: non-obstructing Schatzki ring.  2 cm HH.  Non-bleeding GU w/o  bleeding stigmata. Study somewhat abbreviated due to declining O2 sats.    Pt declined offer/suggestion for colonoscopy.    No transfusions to date.  Hgb down 4 gm since arrival.    *   S/p bioprosthetic AVR 2013.    Subvalvular pseudoaneurysm.    Cardiac cath 04/01/19: no vascular disease, excellent cardiac output, severe bioprosthetic AV stenosis.  Upcoming consult with Dr Roxy Manns re redo AVR and repair of LVOT false pseudoaneurysm.   *   Chronic Eliquis.  For A fib, PE, DVT  On hold.      PLAN   *   Protonix 40  mg/day indefinitely.  If H Pylori is positive: will need abx eradication regimen.   *   Consider Feraheme infusion and B12 IM vs po.     *   Ok to resume Eliquis per cards timing  *   GI signing off. Will need repeat EGD to confirm ulcer healing in 6 or more weeks.      Terri Edwards  04/01/2019, 2:22 PM Phone [phone]

## 2019-04-01 NOTE — Progress Notes (Signed)
Per order nurse to verify that aspirin or antiplatelet medication available for surgery day. Non of this meds ordered. Pt was on eliquis prior to admission, last dose taken 03/29/19. On call MD notified.

## 2019-04-01 NOTE — Discharge Summary (Signed)
Physician Discharge Summary  Terri Edwards [id number] DOB: 1951/01/30 DOA: 03/30/2019  PCP: Asencion Noble, MD  Admit date: 03/30/2019 Discharge date: 04/01/2019  Time spent: 35 minutes  Recommendations for Outpatient Follow-up:  1. Gi Dr.Pyrtle in 2 weeks, needs colonoscopy 2. Cardiology Dr.Crenshaw  Next weeks with labs 3. Eliquis held temporarily in the setting of acute GI bleed, consider resuming as soon as possible at follow-up   Discharge Diagnoses:    Melena   Iron deficiency anemia   S/p AVR   Severe Stenosis of prosthetic aortic valve   TOBACCO ABUSE   Aortic valve disorder   Obesity (BMI 30.0-34.9)   AF (paroxysmal atrial fibrillation) (HCC)   Chronic anticoagulation   GI bleed   Elevated troponin   Heme positive stool   Acute gastric ulcer   Discharge Condition: stable  Diet recommendation: low sodium  Filed Weights   03/30/19 2057 03/31/19 0947 04/01/19 0632  Weight: 77.5 kg 77.5 kg 75.9 kg    History of present illness:  Terri Edwards a 69 y.o.femalewith medical history significant ofaortic stenosiss/p bioprosthetic AVRfollowed by Dr. Stanford Breed, paroxysmal atrial fibrillation, PE, hypertension, hyperlipidemia, and obesity. She presents with complaints of progressively worsening shortness of breath over the last few weeks. She noticed that she was unable to walk as far as she previously could without getting winded or needing to rest. Symptoms seem to be worse in the morning upon wakening. She reports that she is intermittently had some dark stools within the last month, she was recently switched from Coumadin over to Eliquis in November and occasional NSAID use. -In the emergency room she was noted to have a hemoglobin of 9.6 which is 3 g lower than recent baseline and Hemoccult positive stools  Hospital Course:   Iron deficiency anemia/intermittent melena -admitted with hemoglobin down 3 g from baseline on admission with heme positive  stools and h/o melena -Eliquis held, started on IV PPI, GI consulted, underwent EGD yesterday which noted nonbleeding gastric ulcer, colonoscopy was deferred for now -anemia panel with iron deficiency and chronic disease, given IV iron prior to discharge  -Started on oral iron at discharge  -Recommended close gastroenterology follow-up for colonoscopy prior to valve surgery, discussed with GI regarding this they will set up close follow-up  -Eliquis held at discharge, this will need to be resumed as soon as possible pending complete gastroenterology work-up  Aortic stenosis/status post AVR 06/2011 -Repeat echo 1/11 notes severe stenosis of bioprosthetic valve and subvalcular pseudoaneurysm -Suspect this is also contributing to her dyspnea on exertion -Cards input appreciated, patient needs repeat valve replacement however will need to complete gastroenterology work-up including colonoscopy prior to that, GI will follow up with her  Paroxysmal atrial fibrillation -Held Eliquis at this time, continue metoprolol -Follow-up with GI and cardiology to determine when this can be resumed  Chronic diastolic CHF -Appears euvolemic at this time -Stable  Minimally elevated high-sensitivity troponin -Likely secondary to demand from anemia and AS, monitor  Tobacco abuse -Counseled, continue nicotine patch  Hypertension -Continue metoprolol, hold amlodipine  Obesity/BMI 31  Procedures:  EGD: small non bleeding gastric ulcer  Consultations:  CVTS Dr.[name]  [facility] Dr.Pyrtle  Cardiology Dr.Berry  Discharge Exam: Vitals:   04/01/19 0906 04/01/19 1204  BP: (!) 103/43 (!) 109/51  Pulse: (!) 53 66  Resp: (!) 21 16  Temp:  98.5 F (36.9 C)  SpO2:  91%    General: AAO x3 Cardiovascular: S1S2/RRR Respiratory: CTAB  Discharge Instructions   Discharge Instructions  Diet - low sodium heart healthy   Complete by: As directed    Increase activity slowly   Complete by: As  directed      Allergies as of 04/01/2019      Reactions   Nickel    Rash blisters   Sulfa Antibiotics    Blisters  Rash   Tape    Slight skin irritation      Medication List    STOP taking these medications   amLODipine 5 MG tablet Commonly known as: NORVASC   apixaban 5 MG Tabs tablet Commonly known as: Eliquis     TAKE these medications   acetaminophen 500 MG tablet Commonly known as: TYLENOL Take 500 mg by mouth every 6 (six) hours as needed for mild pain.   albuterol 108 (90 Base) MCG/ACT inhaler Commonly known as: VENTOLIN HFA Inhale 2 puffs into the lungs every 4 (four) hours as needed for wheezing or shortness of breath.   amoxicillin 500 MG capsule Commonly known as: AMOXIL Take 4 tablets = 2 grams 30-60 minutes before dental procedures   Delsym 30 MG/5ML liquid Generic drug: dextromethorphan Take 30 mg by mouth as needed for cough.   ferrous sulfate 325 (65 FE) MG tablet Take 1 tablet (325 mg total) by mouth 2 (two) times daily with a meal. Start taking on: April 07, 2019   metoprolol succinate 25 MG 24 hr tablet Commonly known as: TOPROL-XL TAKE 1 TABLET (25 MG TOTAL) BY MOUTH EVERY 12 (TWELVE) HOURS.   pantoprazole 40 MG tablet Commonly known as: PROTONIX Take 1 tablet (40 mg total) by mouth 2 (two) times daily. For reflux What changed:   when to take this  reasons to take this   Sodium Fluoride 5000 PPM 1.1 % Pste Generic drug: Sodium Fluoride 1 application by Mouth Rinse route as directed.      Allergies  Allergen Reactions  . Nickel     Rash blisters  . Sulfa Antibiotics     Blisters  Rash   . Tape     Slight skin irritation   Follow-up Information    Lelon Perla, MD Follow up.   Specialty: Cardiology Why: Office will call you with a follow up appt. Contact information: [address] [place] [address] [phone]        Jerene Bears, MD. Schedule an appointment as soon as possible for a  visit in 2 week(s).   Specialty: Gastroenterology Contact information: [address] [place] [address] [phone]            The results of significant diagnostics from this hospitalization (including imaging, microbiology, ancillary and laboratory) are listed below for reference.    Significant Diagnostic Studies: CARDIAC CATHETERIZATION  Result Date: 04/01/2019 1. No angiographic evidence of CAD Surgical consultation for discussion regarding redo AVR and repair of LVOT false aneurysm.   DG Chest Portable 1 View  Result Date: 03/30/2019 CLINICAL DATA:  Shortness of breath, hypoxia EXAM: PORTABLE CHEST 1 VIEW COMPARISON:  Chest radiograph dated 07/28/2011 FINDINGS: The heart is enlarged. Vascular calcifications are seen in the aortic arch. Median sternotomy wires and an aortic valve prosthesis are redemonstrated. The lungs are clear. There is no pleural effusion or pneumothorax. Degenerative changes are seen in the spine. IMPRESSION: Cardiomegaly.  No acute cardiopulmonary findings. Aortic Atherosclerosis (ICD10-I70.0). Electronically Signed   By: Zerita Boers M.D.   On: 03/30/2019 10:34   ECHOCARDIOGRAM COMPLETE  Result Date: 03/30/2019   ECHOCARDIOGRAM REPORT  Patient Name:   Terri Edwards Date of Exam: 03/30/2019 Medical Rec #:  [id number]          Height:       62.0 in Accession #:    [id number]         Weight:       159.0 lb Date of Birth:  1950/12/20          BSA:          1.73 m Patient Age:    35 years           BP:           114/52 mmHg Patient Gender: F                  HR:           74 bpm. Exam Location:  Inpatient Procedure: 2D Echo Indications:    cardiomegaly 429.3  History:        Patient has prior history of Echocardiogram examinations, most                 recent 02/05/2017. Signs/Symptoms:Dyspnea; Risk                 Factors:Hypertension, Current Smoker and Sleep Apnea. Aortic                 Valve: A  Sonographer:    Johny Chess Referring Phys:  [id number] RONDELL A SMITH IMPRESSIONS  1. Left ventricular ejection fraction, by visual estimation, is 60 to 65%. The left ventricle has normal function. There is mildly increased left ventricular hypertrophy.  2. Left ventricular diastolic parameters are consistent with Grade II diastolic dysfunction (pseudonormalization).  3. Elevated left atrial pressure.  4. Global right ventricle has normal systolic function.The right ventricular size is normal.  5. Left atrial size was severely dilated.  6. Right atrial size was mildly dilated.  7. Moderate mitral annular calcification.  8. The mitral valve is abnormal. Mild mitral valve regurgitation.  9. The tricuspid valve is normal in structure. Tricuspid valve regurgitation is mild. 10. The pulmonic valve was not well visualized. Pulmonic valve regurgitation is not visualized. 11. The tricuspid regurgitant velocity is 3.07 m/s, and with an assumed right atrial pressure of 15 mmHg, the estimated right ventricular systolic pressure is moderately elevated at 52.7 mmHg. 12. The inferior vena cava is dilated in size with <50% respiratory variability, suggesting right atrial pressure of 15 mmHg. 13. 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 Consider gated CT to evaluate for stability 14. S/p Bioprosthetic aortic valve replacement. No regurgitation seen. Severe bioprosthetic stenosis (Vmax 4.9 m/s, MG 60 mmHg, EOA 0.5 cm^2, DVI 0.27, AT 148 ms). Compared to prior TTE on 02/05/17, there is a marked increase in mean gradients (33mmHg to  20mmHg). FINDINGS  Left Ventricle: Left ventricular ejection fraction, by visual estimation, is 60 to 65%. The left ventricle has normal function. The left ventricle has no regional wall motion abnormalities. There is mildly increased left ventricular hypertrophy. Left ventricular diastolic parameters are consistent with Grade II diastolic dysfunction (pseudonormalization). Elevated left atrial pressure. Right Ventricle: The  right ventricular size is normal. No increase in right ventricular wall thickness. Global RV systolic function is has normal systolic function. The tricuspid regurgitant velocity is 3.07 m/s, and with an assumed right atrial pressure  of 15 mmHg, the estimated right ventricular systolic pressure is moderately elevated at 52.7 mmHg. Left Atrium: Left atrial size was severely  dilated. Right Atrium: Right atrial size was mildly dilated Pericardium: Trivial pericardial effusion is present. Mitral Valve: The mitral valve is abnormal. There is mild thickening of the mitral valve leaflet(s). Moderate mitral annular calcification. Mild mitral valve regurgitation. Tricuspid Valve: The tricuspid valve is normal in structure. Tricuspid valve regurgitation is mild. Aortic Valve: The aortic valve has been repaired/replaced. Aortic valve regurgitation is not visualized. Aortic valve mean gradient measures 50.0 mmHg. Aortic valve peak gradient measures 79.9 mmHg. Aortic valve area, by VTI measures 0.65 cm. Bioprosthetic aortic valve replacement. No regurgitation seen. Severe bioprosthetic stenosis (Vmax 4.9 m/s, MG 60 mmHg, EOA 0.7 cm^2, DVI 0.27). Pulmonic Valve: The pulmonic valve was not well visualized. Pulmonic valve regurgitation is not visualized. Pulmonic regurgitation is not visualized. Aorta: The aortic root is normal in size and structure. Venous: The inferior vena cava is dilated in size with less than 50% respiratory variability, suggesting right atrial pressure of 15 mmHg. IAS/Shunts: The atrial septum is grossly normal.  LEFT VENTRICLE PLAX 2D LVIDd:         5.10 cm  Diastology LVIDs:         3.60 cm  LV e' lateral:   8.81 cm/s LV PW:         1.00 cm  LV E/e' lateral: 15.3 LV IVS:        0.90 cm  LV e' medial:    7.94 cm/s LVOT diam:     1.50 cm  LV E/e' medial:  17.0 LV SV:         69 ml LV SV Index:   38.54 LVOT Area:     1.77 cm  RIGHT VENTRICLE RV S prime:     10.30 cm/s TAPSE (M-mode): 1.7 cm LEFT ATRIUM               Index       RIGHT ATRIUM           Index LA diam:        5.10 cm  2.94 cm/m  RA Area:     19.40 cm LA Vol (A2C):   97.1 ml  55.99 ml/m RA Volume:   52.10 ml  30.04 ml/m LA Vol (A4C):   101.0 ml 58.24 ml/m LA Biplane Vol: 101.0 ml 58.24 ml/m  AORTIC VALVE AV Area (Vmax):    0.68 cm AV Area (Vmean):   0.63 cm AV Area (VTI):     0.65 cm AV Vmax:           447.00 cm/s AV Vmean:          334.000 cm/s AV VTI:            1.150 m AV Peak Grad:      79.9 mmHg AV Mean Grad:      50.0 mmHg LVOT Vmax:         172.00 cm/s LVOT Vmean:        119.750 cm/s LVOT VTI:          0.424 m LVOT/AV VTI ratio: 0.37  AORTA Ao Root diam: 2.70 cm MITRAL VALVE                         TRICUSPID VALVE MV Area (PHT): 4.39 cm              TR Peak grad:   37.7 mmHg MV PHT:        50.17 msec  TR Vmax:        307.00 cm/s MV Decel Time: 173 msec MV E velocity: 135.00 cm/s 103 cm/s  SHUNTS MV A velocity: 78.50 cm/s  70.3 cm/s Systemic VTI:  0.42 m MV E/A ratio:  1.72        1.5       Systemic Diam: 1.50 cm  Oswaldo Milian MD Electronically signed by Oswaldo Milian MD Signature Date/Time: 03/30/2019/10:41:40 PM    Final     Microbiology: Recent Results (from the past 240 hour(s))  Respiratory Panel by RT PCR (Flu A&B, Covid) - Nasopharyngeal Swab     Status: None   Collection Time: 03/30/19  1:35 PM   Specimen: Nasopharyngeal Swab  Result Value Ref Range Status   SARS Coronavirus 2 by RT PCR NEGATIVE NEGATIVE Final    Comment: (NOTE) SARS-CoV-2 target nucleic acids are NOT DETECTED. The SARS-CoV-2 RNA is generally detectable in upper respiratoy specimens during the acute phase of infection. The lowest concentration of SARS-CoV-2 viral copies this assay can detect is 131 copies/mL. A negative result does not preclude SARS-Cov-2 infection and should not be used as the sole basis for treatment or other patient management decisions. A negative result may occur with  improper specimen  collection/handling, submission of specimen other than nasopharyngeal swab, presence of viral mutation(s) within the areas targeted by this assay, and inadequate number of viral copies (<131 copies/mL). A negative result must be combined with clinical observations, patient history, and epidemiological information. The expected result is Negative. Fact Sheet for Patients:  PinkCheek.be Fact Sheet for Healthcare Providers:  GravelBags.it This test is not yet ap proved or cleared by the [place] FDA and  has been authorized for detection and/or diagnosis of SARS-CoV-2 by FDA under an Emergency Use Authorization (EUA). This EUA will remain  in effect (meaning this test can be used) for the duration of the COVID-19 declaration under Section 564(b)(1) of the Act, 21 U.S.C. section 360bbb-3(b)(1), unless the authorization is terminated or revoked sooner.    Influenza A by PCR NEGATIVE NEGATIVE Final   Influenza B by PCR NEGATIVE NEGATIVE Final    Comment: (NOTE) The Xpert Xpress SARS-CoV-2/FLU/RSV assay is intended as an aid in  the diagnosis of influenza from Nasopharyngeal swab specimens and  should not be used as a sole basis for treatment. Nasal washings and  aspirates are unacceptable for Xpert Xpress SARS-CoV-2/FLU/RSV  testing. Fact Sheet for Patients: PinkCheek.be Fact Sheet for Healthcare Providers: GravelBags.it This test is not yet approved or cleared by the [place] FDA and  has been authorized for detection and/or diagnosis of SARS-CoV-2 by  FDA under an Emergency Use Authorization (EUA). This EUA will remain  in effect (meaning this test can be used) for the duration of the  Covid-19 declaration under Section 564(b)(1) of the Act, 21  U.S.C. section 360bbb-3(b)(1), unless the authorization is  terminated or revoked. Performed at [facility] [place] [address], [place], [place] [address]      Labs: Basic Metabolic Panel: Recent Labs  Lab 03/30/19 0900 03/31/19 0310 04/01/19 0224 04/01/19 0834 04/01/19 0837  NA 135 139 138 142 140  K 4.7 4.2 4.1 3.9 4.1  CL 101 100 102  --   --   CO2 27 30 28   --   --   GLUCOSE 113* 98 93  --   --   BUN 13 12 17   --   --   CREATININE 0.82 0.88 1.20*  --   --  CALCIUM 8.8* 8.8* 8.5*  --   --    Liver Function Tests: No results for input(s): AST, ALT, ALKPHOS, BILITOT, PROT, ALBUMIN in the last 168 hours. No results for input(s): LIPASE, AMYLASE in the last 168 hours. No results for input(s): AMMONIA in the last 168 hours. CBC: Recent Labs  Lab 03/30/19 0900 03/30/19 1600 03/31/19 1506 04/01/19 0224 04/01/19 0834 04/01/19 0837  WBC 9.6 9.4 8.4 9.0  --   --   NEUTROABS  --  5.2  --   --   --   --   HGB 9.6* 9.2* 9.2* 8.6* 9.2* 9.5*  HCT 31.5* 30.2* 30.3* 27.7* 27.0* 28.0*  MCV 92.6 92.4 90.7 90.2  --   --   PLT 311 297 291 279  --   --    Cardiac Enzymes: No results for input(s): CKTOTAL, CKMB, CKMBINDEX, TROPONINI in the last 168 hours. BNP: BNP (last 3 results) Recent Labs    03/30/19 0900  BNP 617.2*    ProBNP (last 3 results) No results for input(s): PROBNP in the last 8760 hours.  CBG: No results for input(s): GLUCAP in the last 168 hours.     Signed:  Domenic Polite MD.  [facility] 04/01/2019, 4:10 PM

## 2019-04-01 NOTE — Telephone Encounter (Signed)
Patient has been scheduled to see Nicoletta Ba, PA-C on 05/01/19 at 10:00 am.

## 2019-04-01 NOTE — Interval H&P Note (Signed)
History and Physical Interval Note:  04/01/2019 8:07 AM  Terri Edwards  has presented today for cardiac cath with the diagnosis of aortic stenosis.  The various methods of treatment have been discussed with the patient and family. After consideration of risks, benefits and other options for treatment, the patient has consented to  Procedure(s): RIGHT/LEFT HEART CATH AND CORONARY ANGIOGRAPHY (N/A) as a surgical intervention.  The patient's history has been reviewed, patient examined, no change in status, stable for surgery.  I have reviewed the patient's chart and labs.  Questions were answered to the patient's satisfaction.    Cath Lab Visit (complete for each Cath Lab visit)  Clinical Evaluation Leading to the Procedure:   ACS: No.  Non-ACS:    Anginal Classification: No Symptoms  Anti-ischemic medical therapy: Maximal Therapy (2 or more classes of medications)  Non-Invasive Test Results: No non-invasive testing performed  Prior CABG: No previous CABG        Lauree Chandler

## 2019-04-02 ENCOUNTER — Telehealth: Payer: Self-pay

## 2019-04-02 ENCOUNTER — Telehealth: Payer: Self-pay | Admitting: Cardiology

## 2019-04-02 ENCOUNTER — Encounter: Payer: Self-pay | Admitting: *Deleted

## 2019-04-02 NOTE — Telephone Encounter (Signed)
See phone note dated 04/03/19.

## 2019-04-02 NOTE — Telephone Encounter (Signed)
Spoke with pt, she wanted to make sure we were aware of her recent hospitalization. She will have a cbc checked at her follow up appointment.

## 2019-04-02 NOTE — Telephone Encounter (Signed)
Pyrtle, Lajuan Lines, MD  Algernon Huxley, RN  Pt needs APP visit in 2-3 weeks, and then likely ECL  I had messaged earlier and said 6 weeks, but needs to be sooner as she is facing re-do AVR  Thanks  JMP   Pts appt moved to 04/17/19@10 :30am. Pt aware of appt.

## 2019-04-02 NOTE — Telephone Encounter (Signed)
New Message  Pt called and would like to speak with Dr. Jacalyn Lefevre nurse.  Please call to return

## 2019-04-03 ENCOUNTER — Telehealth: Payer: Self-pay | Admitting: *Deleted

## 2019-04-03 LAB — H. PYLORI ANTIGEN, STOOL: H. Pylori Stool Ag, Eia: NEGATIVE

## 2019-04-03 NOTE — Telephone Encounter (Signed)
Note copied into phone note as requested by Dr Hilarie Fredrickson so GI can see this easier in the outpatient setting.        Attached Notes  Progress Notes by Rexene Alberts, MD at 04/01/2019 4:11 PM  Author: Rexene Alberts, MD Service: Cardiothoracic Surgery Author Type: Physician  Filed: 04/01/2019 4:18 PM Date of Service: 04/01/2019 4:11 PM Note Type: Progress Notes  Status: Signed Editor: Rexene Alberts, MD (Physician)    T[facility] BRIEF PROGRESS NOTE  Day of Surgery  S/P Procedure(s) (LRB): RIGHT HEART CATH AND CORONARY ANGIOGRAPHY (N/A)    Patient is well-known to me from her previous surgery and multiple outpatient follow-up visits.  Please see her most recent surgical consult note dated April 15, 2018.  I have personally reviewed the patient's recent history, transthoracic echocardiogram, and diagnostic cardiac catheterization.  She needs redo aortic valve replacement but this should be postponed until her active gastric ulcer has healed.  Her previous episodes of atrial fibrillation and very small pulmonary embolus all occurred during the early postoperative phase following her surgery in 2013.  To my knowledge she has not had any recurrence of atrial fibrillation since then, which both the patient and her husband confirm.  Under the circumstances I favor stopping long-term anticoagulation at least temporarily.  We will plan to see the patient back in our office in approximately 1 month as long as she has been cleared by gastroenterology.  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  We can make arrangements at the time of her visit in our office if it has not already been completed.   I spent in excess of 15 minutes during the conduct of this hospital encounter and >50% of this time involved direct face-to-face encounter with the  patient for counseling and/or coordination of their care.     Rexene Alberts, MD 04/01/2019 4:11 PM

## 2019-04-08 NOTE — Progress Notes (Signed)
HPI: FU AVR and PAF. She has a history of aortic stenosis, status post Pericardial tissue aortic valve replacement in April 2013. She was readmitted to the hospital later that month with atrial fibrillation with rapid ventricular rate as well as bilateral pulmonary emboli. She was placed on Coumadin. Patient seen by Richardson Dopp in July 2013 with episode of AFib with a heart rate of 134 during exercise at cardiac rehabilitation. Carotid Dopplers10/17 negative. Echocardiogram November 2018 showed normal LV systolic function, prior aortic valve replacement with mean gradient 20 mmHg and moderate right atrial enlargement.  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  She was seen by Dr. Roxy Manns and preferred observation.  Echocardiogram January 2021 showed normal LV function, grade 2 diastolic dysfunction, severe left atrial enlargement, mild right atrial enlargement, mild mitral and tricuspid regurgitation, prior bioprosthetic aortic valve with severe stenosis with mean gradient 60 mmHg.  Cardiac catheterization January 2021 showed no coronary disease.  Recently admitted with complaints of increasing dyspnea and dark stools.  Had EGD showing nonbleeding gastric ulcer.  Apixaban held.  Patient was seen by Dr. Roxy Manns and she will require redo aortic valve replacement.  However it will be delayed until her gastric ulcer has healed.  He also recommended repeating CTA of her aorta to reassess previously documented pseudoaneurysm.  Since I last saw her,  she denies chest pain, palpitations or syncope.  She has mild dyspnea on exertion improved since hospitalization.  She has dark stools but is on iron.  Current Outpatient Medications  Medication Sig Dispense Refill  . acetaminophen (TYLENOL) 500 MG tablet Take 500 mg by mouth every 6 (six) hours as needed for mild pain.    Marland Kitchen albuterol (PROVENTIL HFA;VENTOLIN HFA) 108 (90 Base) MCG/ACT  inhaler Inhale 2 puffs into the lungs every 4 (four) hours as needed for wheezing or shortness of breath.    Marland Kitchen amoxicillin (AMOXIL) 500 MG capsule Take 4 tablets = 2 grams 30-60 minutes before dental procedures 4 capsule 6  . dextromethorphan (DELSYM) 30 MG/5ML liquid Take 30 mg by mouth as needed for cough.    . ferrous sulfate 325 (65 FE) MG tablet Take 1 tablet (325 mg total) by mouth 2 (two) times daily with a meal. 60 tablet 1  . metoprolol succinate (TOPROL-XL) 25 MG 24 hr tablet TAKE 1 TABLET (25 MG TOTAL) BY MOUTH EVERY 12 (TWELVE) HOURS. 180 tablet 3  . pantoprazole (PROTONIX) 40 MG tablet Take 1 tablet (40 mg total) by mouth 2 (two) times daily. For reflux (Patient taking differently: Take 40 mg by mouth daily as needed (reflux). For reflux) 180 tablet 3  . SODIUM FLUORIDE 5000 PPM 1.1 % PSTE 1 application by Mouth Rinse route as directed.      No current facility-administered medications for this visit.     Past Medical History:  Diagnosis Date  . Aortic stenosis    a. echo 1/13: normal EF, severe AS, mean gradient 50 mmHg => s/p AVR 4/13;   b.Echo 07/14/11: Mod LVH, EF 55-60%, grade 2 diast dysfxn, AVR mean gradient 21 mmHg, mod to severe LAE, mild RVE, mod RAE, PASP 32   . Arthritis   . Atrial fibrillation ([place])    Post operative following AVR  . Bell's palsy 2005  . 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 GERD (gastroesophageal reflux disease)   . HTN (hypertension), benign   . Hx  of cardiac cath    [facility] 4/13:  no CAD, severe AS  . Hypercholesteremia   . Obesity (BMI 30.0-34.9) 06/29/2011  . Pseudoaneurysm of left ventricle of heart   . Pulmonary embolus ([facility])    following AVR  . S/P aortic valve replacement with bioprosthetic valve 07/05/2011   21mm Edwards Magna Ease pericardial tissue valve    Past Surgical History:  Procedure Laterality Date  . AORTIC VALVE REPLACEMENT  07/05/2011   Procedure: AORTIC VALVE REPLACEMENT (AVR);  Surgeon: Rexene Alberts, MD;   Location: [facility];  Service: Open Heart Surgery;  Laterality: N/A;  . CATARACT EXTRACTION     bil  . ESOPHAGOGASTRODUODENOSCOPY (EGD) WITH PROPOFOL N/A 03/31/2019   Procedure: ESOPHAGOGASTRODUODENOSCOPY (EGD) WITH PROPOFOL;  Surgeon: Jerene Bears, MD;  Location: [place];  Service: Gastroenterology;  Laterality: N/A;  . KNEE ARTHROSCOPY  lft  . PARS PLANA VITRECTOMY W/ REPAIR OF MACULAR HOLE    . RIGHT HEART CATH AND CORONARY ANGIOGRAPHY N/A 04/01/2019   Procedure: RIGHT HEART CATH AND CORONARY ANGIOGRAPHY;  Surgeon: Burnell Blanks, MD;  Location: [place] [facility];  Service: Cardiovascular;  Laterality: N/A;    Social History   Socioeconomic History  . Marital status: Married    Spouse name: Not on file  . Number of children: Not on file  . Years of education: Not on file  . Highest education level: Not on file  Occupational History  . Occupation: hospice  Tobacco Use  . Smoking status: Former Smoker    Packs/day: 1.00    Years: 45.00    Pack years: 45.00    Types: Cigarettes    Quit date: 07/04/2011    Years since quitting: 7.7  . Smokeless tobacco: Never Used  Substance and Sexual Activity  . Alcohol use: Yes    Alcohol/week: 1.0 standard drinks    Types: 1 Standard drinks or equivalent per week    Comment: rare  . Drug use: Not on file  . Sexual activity: Yes    Birth control/protection: Post-menopausal  Other Topics Concern  . Not on file  Social History Narrative  . Not on file   Social Determinants of Health   Financial Resource Strain:   . Difficulty of Paying Living Expenses: Not on file  Food Insecurity:   . Worried About Charity fundraiser in the Last Year: Not on file  . Ran Out of Food in the Last Year: Not on file  Transportation Needs:   . Lack of Transportation (Medical): Not on file  . Lack of Transportation (Non-Medical): Not on file  Physical Activity:   . Days of Exercise per Week: Not on file  . Minutes of Exercise per Session:  Not on file  Stress:   . Feeling of Stress : Not on file  Social Connections:   . Frequency of Communication with Friends and Family: Not on file  . Frequency of Social Gatherings with Friends and Family: Not on file  . Attends Religious Services: Not on file  . Active Member of Clubs or Organizations: Not on file  . Attends Archivist Meetings: Not on file  . Marital Status: Not on file  Intimate Partner Violence:   . Fear of Current or Ex-Partner: Not on file  . Emotionally Abused: Not on file  . Physically Abused: Not on file  . Sexually Abused: Not on file    Family History  Problem Relation Age of Onset  . Stroke Father   .  Heart attack Mother     ROS: no fevers or chills, productive cough, hemoptysis, dysphasia, odynophagia, melena, hematochezia, dysuria, hematuria, rash, seizure activity, orthopnea, PND, pedal edema, claudication. Remaining systems are negative.  Physical Exam: Well-developed well-nourished in no acute distress.  Skin is warm and dry.  HEENT is normal.  Neck is supple.  Chest is clear to auscultation with normal expansion.  Cardiovascular exam is regular rate and rhythm.  3/6 systolic murmur left sternal border. Abdominal exam nontender or distended. No masses palpated. Extremities show no edema. 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 Roxy Manns and will require redo surgery.  2 paroxysmal atrial fibrillation-apixaban is on hold because of recent GI bleed.  She had documented atrial fibrillation following her previous surgery as outlined in my note.  She was asymptomatic with this.  When she improves from GI perspective would favor resuming anticoagulation long-term.  3 hypertension-blood pressure controlled.  Continue present medications.  4 history of hyperlipidemia-continue diet.  Declined statins previously.  5 tobacco  abuse-patient counseled on discontinuing.  Kirk Ruths, MD

## 2019-04-10 ENCOUNTER — Encounter: Payer: Self-pay | Admitting: Cardiology

## 2019-04-10 ENCOUNTER — Ambulatory Visit (INDEPENDENT_AMBULATORY_CARE_PROVIDER_SITE_OTHER): Payer: Medicare Other | Admitting: Cardiology

## 2019-04-10 ENCOUNTER — Other Ambulatory Visit: Payer: Self-pay

## 2019-04-10 DIAGNOSIS — I480 Paroxysmal atrial fibrillation: Secondary | ICD-10-CM | POA: Diagnosis not present

## 2019-04-10 DIAGNOSIS — F17200 Nicotine dependence, unspecified, uncomplicated: Secondary | ICD-10-CM | POA: Diagnosis not present

## 2019-04-10 DIAGNOSIS — Z953 Presence of xenogenic heart valve: Secondary | ICD-10-CM

## 2019-04-10 DIAGNOSIS — E7800 Pure hypercholesterolemia, unspecified: Secondary | ICD-10-CM

## 2019-04-10 MED ORDER — AMOXICILLIN 500 MG PO CAPS: ORAL_CAPSULE | ORAL | 6 refills | Status: AC

## 2019-04-10 MED ORDER — PANTOPRAZOLE SODIUM 40 MG PO TBEC: 40.0000 mg | DELAYED_RELEASE_TABLET | Freq: Every day | ORAL | 1 refills | Status: DC | PRN

## 2019-04-10 MED ORDER — METOPROLOL SUCCINATE ER 25 MG PO TB24: 25.0000 mg | ORAL_TABLET | Freq: Two times a day (BID) | ORAL | 3 refills | Status: AC

## 2019-04-10 NOTE — Patient Instructions (Signed)
Medication Instructions:  NO CHANGES *If you need a refill on your cardiac medications before your next appointment, please call your pharmacy*  Lab Work: NONE If you have labs (blood work) drawn today and your tests are completely normal, you will receive your results only by: Marland Kitchen MyChart Message (if you have MyChart) OR . A paper copy in the mail If you have any lab test that is abnormal or we need to change your treatment, we will call you to review the results.   Follow-Up: At [facility], you and your health needs are our priority.  As part of our continuing mission to provide you with exceptional heart care, we have created designated Provider Care Teams.  These Care Teams include your primary Cardiologist (physician) and Advanced Practice Providers (APPs -  Physician Assistants and Nurse Practitioners) who all work together to provide you with the care you need, when you need it.  Your next appointment:   3 month(s)  The format for your next appointment:   In Person  Provider:   Kirk Ruths, MD  Other Instructions [place]

## 2019-04-17 ENCOUNTER — Ambulatory Visit (INDEPENDENT_AMBULATORY_CARE_PROVIDER_SITE_OTHER): Payer: Medicare Other | Admitting: Physician Assistant

## 2019-04-17 ENCOUNTER — Encounter: Payer: Self-pay | Admitting: Physician Assistant

## 2019-04-17 VITALS — BP 162/60 | HR 60 | Temp 98.4°F | Ht 62.0 in | Wt 172.5 lb

## 2019-04-17 DIAGNOSIS — I480 Paroxysmal atrial fibrillation: Secondary | ICD-10-CM

## 2019-04-17 DIAGNOSIS — K259 Gastric ulcer, unspecified as acute or chronic, without hemorrhage or perforation: Secondary | ICD-10-CM

## 2019-04-17 DIAGNOSIS — Z953 Presence of xenogenic heart valve: Secondary | ICD-10-CM | POA: Diagnosis not present

## 2019-04-17 DIAGNOSIS — F17200 Nicotine dependence, unspecified, uncomplicated: Secondary | ICD-10-CM | POA: Diagnosis not present

## 2019-04-17 DIAGNOSIS — Z01818 Encounter for other preprocedural examination: Secondary | ICD-10-CM

## 2019-04-17 DIAGNOSIS — E7800 Pure hypercholesterolemia, unspecified: Secondary | ICD-10-CM

## 2019-04-17 DIAGNOSIS — D509 Iron deficiency anemia, unspecified: Secondary | ICD-10-CM | POA: Diagnosis not present

## 2019-04-17 MED ORDER — PANTOPRAZOLE SODIUM 40 MG PO TBEC: DELAYED_RELEASE_TABLET | ORAL | 1 refills | Status: AC

## 2019-04-17 NOTE — Patient Instructions (Signed)
If you are age 70 or older, your body mass index should be between 23-30. Your Body mass index is 31.55 kg/m. If this is out of the aforementioned range listed, please consider follow up with your Primary Care Provider.  If you are age 2 or younger, your body mass index should be between 19-25. Your Body mass index is 31.55 kg/m. If this is out of the aformentioned range listed, please consider follow up with your Primary Care Provider.   We have sent the following medications to your pharmacy for you to pick up at your convenience: Pantoprazole  You have been scheduled for an endoscopy. Please follow written instructions given to you at your visit today. If you use inhalers (even only as needed), please bring them with you on the day of your procedure.

## 2019-04-18 ENCOUNTER — Encounter: Payer: Self-pay | Admitting: Physician Assistant

## 2019-04-18 NOTE — Progress Notes (Signed)
Subjective:    Patient ID: Terri Edwards, female    DOB: 1950-04-26, 69 y.o.   MRN: [id number]  HPI Terri Edwards is a pleasant 69 year old white female, recently established with Dr. Hilarie Fredrickson when seen during hospitalization earlier this month.  She had presented with anemia and dark stool in setting of Eliquis. She underwent EGD on 03/31/2019 with finding of a 5 mm prepyloric gastric ulcer, nonbleeding.  She was started on twice daily PPI, H. pylori stool antigen was checked and negative.  Also found to be iron deficient.  She received Feraheme x1 in the hospital and is now on oral iron. Hemoglobin on discharge was 8.6 hematocrit 27. Patient has history of aortic valve replacement with bioprosthetic valve and also underwent work-up during  hospitalization due to complaints of progressive dyspnea.  She was found to have severe aortic valve stenosis and is going to need aortic valve replacement very soon.  She was evaluated by Dr. Roxy Manns and has follow-up with him next week. Decision was made to leave her off Eliquis at the time of discharge. She says she had repeat labs done by her PCP earlier this week and was told her hemoglobin was up to 10.1. She says she continues to feel somewhat short of breath with exertion but otherwise has been feeling fine.  Has no complaints of abdominal pain, stools have been darker on iron. Initial plan was for repeat EGD in about 8 weeks, and also to consider outpatient colonoscopy at that time which she has not had done in the past.  However due to more urgent need for aortic valve replacement plan was to reevaluate her stomach at 3 to 4-week interval.  Review of Systems Pertinent positive and negative review of systems were noted in the above HPI section.  All other review of systems was otherwise negative.  Outpatient Encounter Medications as of 04/17/2019  Medication Sig  . acetaminophen (TYLENOL) 500 MG tablet Take 500 mg by mouth every 6 (six) hours as needed for  mild pain.  Marland Kitchen albuterol (PROVENTIL HFA;VENTOLIN HFA) 108 (90 Base) MCG/ACT inhaler Inhale 2 puffs into the lungs every 4 (four) hours as needed for wheezing or shortness of breath.  Marland Kitchen amoxicillin (AMOXIL) 500 MG capsule Take 4 tablets = 2 grams 30-60 minutes before dental procedures  . dextromethorphan (DELSYM) 30 MG/5ML liquid Take 30 mg by mouth as needed for cough.  . ferrous sulfate 325 (65 FE) MG tablet Take 1 tablet (325 mg total) by mouth 2 (two) times daily with a meal.  . metoprolol succinate (TOPROL-XL) 25 MG 24 hr tablet Take 1 tablet (25 mg total) by mouth every 12 (twelve) hours.  . pantoprazole (PROTONIX) 40 MG tablet Take one tablet twice daily for 8 weeks.  . SODIUM FLUORIDE 5000 PPM 1.1 % PSTE 1 application by Mouth Rinse route as directed.   . [DISCONTINUED] pantoprazole (PROTONIX) 40 MG tablet Take 1 tablet (40 mg total) by mouth daily as needed (reflux). For reflux   No facility-administered encounter medications on file as of 04/17/2019.   Allergies  Allergen Reactions  . Nickel     Rash blisters  . Sulfa Antibiotics     Blisters  Rash   . Tape     Slight skin irritation   Patient Active Problem List   Diagnosis Date Noted  . Heme positive stool   . Acute gastric ulcer   . Acute respiratory failure with hypoxia ([place]) 03/30/2019  . GI bleed 03/30/2019  . Elevated troponin  03/30/2019  . 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 Pseudoaneurysm of left ventricle of heart   . Bruit 11/24/2014  . Dizziness 03/06/2012  . Anemia 03/06/2012  . Sleep apnea 07/28/2011  . Chronic anticoagulation 07/24/2011  . Pulmonary infiltrates 07/20/2011  . Pulmonary edema 07/17/2011  . Pleural effusion 07/17/2011  . Hypoxemia 07/17/2011  . Pneumonia, organism unspecified(486) 07/17/2011  . AF (paroxysmal atrial fibrillation) ([place]) 07/13/2011  . Fluid overload 07/06/2011  . Obesity (BMI 30.0-34.9) 06/29/2011  . Pure hypercholesterolemia 06/01/2008  . TOBACCO ABUSE  06/01/2008  . HYPERTENSION, BENIGN ESSENTIAL 06/01/2008  . Aortic valve disorder 06/01/2008  . DYSPNEA 06/01/2008   Social History   Socioeconomic History  . Marital status: Married    Spouse name: Not on file  . Number of children: Not on file  . Years of education: Not on file  . Highest education level: Not on file  Occupational History  . Occupation: hospice  Tobacco Use  . Smoking status: Current Some Day Smoker    Packs/day: 1.00    Years: 45.00    Pack years: 45.00    Types: Cigarettes    Last attempt to quit: 07/04/2011    Years since quitting: 7.7  . Smokeless tobacco: Never Used  Substance and Sexual Activity  . Alcohol use: Yes    Alcohol/week: 1.0 standard drinks    Types: 1 Standard drinks or equivalent per week    Comment: rare  . Drug use: Never  . Sexual activity: Yes    Birth control/protection: Post-menopausal  Other Topics Concern  . Not on file  Social History Narrative  . Not on file   Social Determinants of Health   Financial Resource Strain:   . Difficulty of Paying Living Expenses: Not on file  Food Insecurity:   . Worried About Charity fundraiser in the Last Year: Not on file  . Ran Out of Food in the Last Year: Not on file  Transportation Needs:   . Lack of Transportation (Medical): Not on file  . Lack of Transportation (Non-Medical): Not on file  Physical Activity:   . Days of Exercise per Week: Not on file  . Minutes of Exercise per Session: Not on file  Stress:   . Feeling of Stress : Not on file  Social Connections:   . Frequency of Communication with Friends and Family: Not on file  . Frequency of Social Gatherings with Friends and Family: Not on file  . Attends Religious Services: Not on file  . Active Member of Clubs or Organizations: Not on file  . Attends Archivist Meetings: Not on file  . Marital Status: Not on file  Intimate Partner Violence:   . Fear of Current or Ex-Partner: Not on file  . Emotionally  Abused: Not on file  . Physically Abused: Not on file  . Sexually Abused: Not on file    Ms. Courington's family history includes Heart attack in her mother; Stroke in her father.      Objective:    Vitals:   04/17/19 1004  BP: (!) 162/60  Pulse: 60  Temp: 98.4 F (36.9 C)    Physical Exam Well-developed well-nourished older white female in no acute distress.  Height, Weight 172, BMI 31.5  HEENT; nontraumatic normocephalic, EOMI, PER LA, sclera anicteric. Oropharynx; not examined Neck; supple, no JVD Cardiovascular; regular rate and rhythm with [id number], holosystolic murmur, no rub or gallop Pulmonary; Clear bilaterally Abdomen; soft, nontender, nondistended, no  palpable mass or hepatosplenomegaly, bowel sounds are active Rectal; not done Skin; benign exam, no jaundice rash or appreciable lesions Extremities; no clubbing cyanosis or edema skin warm and dry Neuro/Psych; alert and oriented x4, grossly nonfocal mood and affect appropriate       Assessment & Plan:   #7 69 year old female here for post hospital follow-up, recent hospitalization with dyspnea, anemia iron deficiency and dark stool in setting of Eliquis.  Patient found to have 5 mm prepyloric gastric ulcer which was nonbleeding.  No current GI symptoms, and hemoglobin this week had improved to 10.1. H. pylori stool antigen negative.  #2 iron deficiency anemia-received IV Feraheme during hospitalization and now on oral iron.  Etiology of the iron deficiency anemia not entirely clear.  Need to consider colonic and/or small bowel lesions. #3 history of aortic valve replacement, now with recurrent severe aortic valve stenosis.  Patient needs valve replacement within the next few weeks  #4 history of chronic anticoagulation-currently off Eliquis #5 left ventricular pseudoaneurysm #6 history of atrial fibrillation  Plan; continue Protonix 40 mg p.o. twice daily for 2 months, then decrease to once daily. I discussed  repeat EGD versus EGD and colonoscopy with the patient.  She remains reluctant to proceed with colonoscopy at this time, but is agreeable to repeat EGD.  She would like to get through the aortic valve replacement surgery prior to considering colonoscopy. She will be scheduled for EGD with Dr. Hilarie Fredrickson.  Procedure was discussed in detail with the patient including indications risks and benefits and she is agreeable to proceed.  She will need serial hemoglobins, repeat iron studies in 3 months, and will need to reevaluate post aortic valve replacement and recovery for outpatient colonoscopy.   Tamir Wallman Genia Harold PA-C 04/18/2019   Cc: Asencion Noble, MD

## 2019-04-21 ENCOUNTER — Other Ambulatory Visit: Payer: Self-pay

## 2019-04-21 ENCOUNTER — Other Ambulatory Visit: Payer: Self-pay | Admitting: *Deleted

## 2019-04-21 ENCOUNTER — Encounter: Payer: Self-pay | Admitting: Thoracic Surgery (Cardiothoracic Vascular Surgery)

## 2019-04-21 ENCOUNTER — Ambulatory Visit (INDEPENDENT_AMBULATORY_CARE_PROVIDER_SITE_OTHER): Payer: Medicare Other | Admitting: Thoracic Surgery (Cardiothoracic Vascular Surgery)

## 2019-04-21 VITALS — BP 148/67 | HR 85 | Temp 97.7°F | Resp 16 | Ht 62.0 in | Wt 172.0 lb

## 2019-04-21 DIAGNOSIS — Z01818 Encounter for other preprocedural examination: Secondary | ICD-10-CM

## 2019-04-21 DIAGNOSIS — I253 Aneurysm of heart: Secondary | ICD-10-CM

## 2019-04-21 DIAGNOSIS — Z953 Presence of xenogenic heart valve: Secondary | ICD-10-CM

## 2019-04-21 DIAGNOSIS — T8209XA Other mechanical complication of heart valve prosthesis, initial encounter: Secondary | ICD-10-CM

## 2019-04-21 DIAGNOSIS — I7101 Dissection of thoracic aorta: Secondary | ICD-10-CM

## 2019-04-21 DIAGNOSIS — I350 Nonrheumatic aortic (valve) stenosis: Secondary | ICD-10-CM

## 2019-04-21 DIAGNOSIS — I4891 Unspecified atrial fibrillation: Secondary | ICD-10-CM

## 2019-04-21 DIAGNOSIS — Z01812 Encounter for preprocedural laboratory examination: Secondary | ICD-10-CM

## 2019-04-21 DIAGNOSIS — K253 Acute gastric ulcer without hemorrhage or perforation: Secondary | ICD-10-CM | POA: Diagnosis not present

## 2019-04-21 DIAGNOSIS — I71019 Dissection of thoracic aorta, unspecified: Secondary | ICD-10-CM

## 2019-04-21 DIAGNOSIS — I729 Aneurysm of unspecified site: Secondary | ICD-10-CM | POA: Diagnosis not present

## 2019-04-21 NOTE — Progress Notes (Signed)
[address]       [place] [address]             [phone]     CARDIOTHORACIC SURGERY CONSULTATION REPORT  Primary Cardiologist is Terri Ruths, MD PCP is Terri Noble, MD  Chief Complaint  Patient presents with  . 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  Patient's cardiac history dates back to 2013 when she underwent elective aortic valve replacement using a 21 mm Edwards magna ease stented bovine pericardial tissue valve for bicuspid aortic valve with severe symptomatic aortic stenosis.  The patient's surgical procedure and postoperative recovery was initially uneventful.  She was briefly readmitted approximately 1 month postoperatively with atrial fibrillation and a small pulmonary embolism.  She was anticoagulated using warfarin and has remained on warfarin ever since.  She has been seen regularly on an annual basis by Dr. Stanford Edwards, and most recent transthoracic echocardiogram performed February 05, 2017 revealed normal left ventricular systolic function with normal functioning bioprosthetic tissue valve in the aortic position.  Peak velocity across the aortic valve and mean transvalvular gradient reported 3.3 m/s and 20 mmHg, respectively, unchanged from echocardiograms performed immediately following her surgery in 2013.  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  This revealed a 2.3 x 3.1 x 2.1 cm "extension of the left ventricular cavity" that in retrospect was felt to be present but smaller on the CT angiogram of the chest performed July 17, 2011 when the patient was diagnosed with acute pulmonary embolism.  In follow-up a cardiac gated CT angiogram of the heart was performed March 14, 2018, confirming the presence of what appeared to be a pseudoaneurysm involving the left ventricular outflow tract just beneath the prosthetic valve along the noncoronary sinus of Valsalva.  Cardiothoracic surgical consultation was requested and I saw the patient in consultation on April 15, 2018.  At that time the patient was essentially asymptomatic and did not wish to proceed with elective surgical intervention.  Over the past year the patient has developed gradual worsening of exertional shortness of breath.  She was hospitalized from March 30, 2019 to April 01, 2019 with acute exacerbation of shortness of breath.  She was noticed to be anemic with hemoglobin 9.6, down 3 g from her previous baseline.  She was also noted to have Hemoccult positive stool.  Follow-up transthoracic echocardiogram revealed increased gradient across the bioprosthetic tissue valve in the aortic position with severe aortic stenosis.  Peak velocity across aortic valve measured close to 4.5 m/s corresponding to mean transvalvular gradient estimated 50 mmHg and aortic valve area calculated 0.68 cm.  The DVI was reported 0.37 and left ventricular systolic function remain normal.  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  Diagnostic cardiac catheterization was performed and notable for the absence of significant coronary artery disease.  Patient had hyperdynamic cardiac  output consistent with anemia.  Upper GI endoscopy was performed and notable for the presence of a nonbleeding ulcer in the prepyloric region that measured 5 mm in its largest dimension.  H. pylori  testing was subsequently negative.  The patient was placed on twice daily Protonix and has been seen in follow-up in the GI clinic.  Hemoglobin has come up slightly to 10.0 and the patient has been scheduled for follow-up EGD early next week to make sure that her ulcer is healed.  She returns to our office today to further discuss elective redo aortic valve replacement with repair of false aneurysm involving the left ventricular outflow tract.    The patient is retired and lives locally with her husband who is also a previous patient of mine.    Since hospital discharge the patient reports remaining clinically stable.  She still gets short of breath with moderate level activity and this limits her considerably.  She denies resting shortness of breath or orthopnea.  Stools remain dark but she is on oral iron supplementation. She has no exertional chest pain or chest tightness.  She has not had any dizzy spells or syncope.  She denies productive cough, hemoptysis, or wheezing.  She has been practicing social distancing and she has not been exposed to any persons with known or suspected COVID-19 infection.  She has not been taking any oral anticoagulation medication since hospital discharge.   Past Medical History:  Diagnosis Date  . Aortic stenosis    a. echo 1/13: normal EF, severe AS, mean gradient 50 mmHg => s/p AVR 4/13;   b.Echo 07/14/11: Mod LVH, EF 55-60%, grade 2 diast dysfxn, AVR mean gradient 21 mmHg, mod to severe LAE, mild RVE, mod RAE, PASP 32   . Arthritis   . Atrial fibrillation ([place])    Post operative following AVR  . Bell's palsy 2005  . 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 GERD (gastroesophageal reflux disease)   . HTN (hypertension), benign   . Hx of cardiac cath    [facility] 4/13:  no CAD, severe AS  . Hypercholesteremia   . Obesity (BMI 30.0-34.9) 06/29/2011  . Pseudoaneurysm of left ventricle of heart   . Pulmonary embolus ([facility])    following AVR  . S/P aortic valve  replacement with bioprosthetic valve 07/05/2011   59mm Edwards Magna Ease pericardial tissue valve    Past Surgical History:  Procedure Laterality Date  . AORTIC VALVE REPLACEMENT  07/05/2011   Procedure: AORTIC VALVE REPLACEMENT (AVR);  Surgeon: Rexene Alberts, MD;  Location: [place] [facility];  Service: Open Heart Surgery;  Laterality: N/A;  . CATARACT EXTRACTION     bil  . ESOPHAGOGASTRODUODENOSCOPY (EGD) WITH PROPOFOL N/A 03/31/2019   Procedure: ESOPHAGOGASTRODUODENOSCOPY (EGD) WITH PROPOFOL;  Surgeon: Jerene Bears, MD;  Location: [place];  Service: Gastroenterology;  Laterality: N/A;  . KNEE ARTHROSCOPY  lft  . PARS PLANA VITRECTOMY W/ REPAIR OF MACULAR HOLE    . RIGHT HEART CATH AND CORONARY ANGIOGRAPHY N/A 04/01/2019   Procedure: RIGHT HEART CATH AND CORONARY ANGIOGRAPHY;  Surgeon: Burnell Blanks, MD;  Location: [facility];  Service: Cardiovascular;  Laterality: N/A;    Family History  Problem Relation Age of Onset  . Stroke Father   . Heart attack Mother     Social History   Socioeconomic History  . Marital status: Married    Spouse name: Not on file  . Number of children: Not on file  .  Years of education: Not on file  . Highest education level: Not on file  Occupational History  . Occupation: hospice  Tobacco Use  . Smoking status: Current Some Day Smoker    Packs/day: 1.00    Years: 45.00    Pack years: 45.00    Types: Cigarettes    Last attempt to quit: 07/04/2011    Years since quitting: 7.8  . Smokeless tobacco: Never Used  Substance and Sexual Activity  . Alcohol use: Yes    Alcohol/week: 1.0 standard drinks    Types: 1 Standard drinks or equivalent per week    Comment: rare  . Drug use: Never  . Sexual activity: Yes    Birth control/protection: Post-menopausal  Other Topics Concern  . Not on file  Social History Narrative  . Not on file   Social Determinants of Health   Financial Resource Strain:   . Difficulty of Paying Living  Expenses: Not on file  Food Insecurity:   . Worried About Charity fundraiser in the Last Year: Not on file  . Ran Out of Food in the Last Year: Not on file  Transportation Needs:   . Lack of Transportation (Medical): Not on file  . Lack of Transportation (Non-Medical): Not on file  Physical Activity:   . Days of Exercise per Week: Not on file  . Minutes of Exercise per Session: Not on file  Stress:   . Feeling of Stress : Not on file  Social Connections:   . Frequency of Communication with Friends and Family: Not on file  . Frequency of Social Gatherings with Friends and Family: Not on file  . Attends Religious Services: Not on file  . Active Member of Clubs or Organizations: Not on file  . Attends Archivist Meetings: Not on file  . Marital Status: Not on file  Intimate Partner Violence:   . Fear of Current or Ex-Partner: Not on file  . Emotionally Abused: Not on file  . Physically Abused: Not on file  . Sexually Abused: Not on file    Current Outpatient Medications  Medication Sig Dispense Refill  . acetaminophen (TYLENOL) 500 MG tablet Take 500 mg by mouth every 6 (six) hours as needed for mild pain.    Marland Kitchen albuterol (PROVENTIL HFA;VENTOLIN HFA) 108 (90 Base) MCG/ACT inhaler Inhale 2 puffs into the lungs every 4 (four) hours as needed for wheezing or shortness of breath.    Marland Kitchen amoxicillin (AMOXIL) 500 MG capsule Take 4 tablets = 2 grams 30-60 minutes before dental procedures 4 capsule 6  . dextromethorphan (DELSYM) 30 MG/5ML liquid Take 30 mg by mouth as needed for cough.    . ferrous sulfate 325 (65 FE) MG tablet Take 1 tablet (325 mg total) by mouth 2 (two) times daily with a meal. 60 tablet 1  . metoprolol succinate (TOPROL-XL) 25 MG 24 hr tablet Take 1 tablet (25 mg total) by mouth every 12 (twelve) hours. 180 tablet 3  . pantoprazole (PROTONIX) 40 MG tablet Take one tablet twice daily for 8 weeks. 60 tablet 1  . SODIUM FLUORIDE 5000 PPM 1.1 % PSTE 1 application by  Mouth Rinse route as directed.      No current facility-administered medications for this visit.    Allergies  Allergen Reactions  . Nickel     Rash blisters  . Sulfa Antibiotics     Blisters  Rash   . Tape     Slight skin irritation  Review of Systems:   General:  normal appetite, decreased energy, no weight gain, no weight loss, no fever  Cardiac:  no chest pain with exertion, no chest pain at rest, +SOB with exertion, no resting SOB, no PND, no orthopnea, no palpitations, no arrhythmia, no atrial fibrillation, no LE edema, no dizzy spells, no syncope  Respiratory:  + shortness of breath, no home oxygen, no productive cough, no dry cough, no bronchitis, no wheezing, no hemoptysis, no asthma, no pain with inspiration or cough, no sleep apnea, no CPAP at night  GI:   no difficulty swallowing, + reflux, no frequent heartburn, no hiatal hernia, no abdominal pain, no constipation, no diarrhea, no hematochezia, no hematemesis, no melena  GU:   no dysuria,  no frequency, no urinary tract infection, no hematuria, no kidney stones, no kidney disease  Vascular:  no pain suggestive of claudication, no pain in feet, no leg cramps, no varicose veins, no DVT, no non-healing foot ulcer  Neuro:   no stroke, no TIA's, no seizures, no headaches, no temporary blindness one eye,  no slurred speech, no peripheral neuropathy, no chronic pain, no instability of gait, no memory/cognitive dysfunction  Musculoskeletal: + arthritis, no joint swelling, no myalgias, no difficulty walking, normal mobility   Skin:   no rash, no itching, no skin infections, no pressure sores or ulcerations  Psych:   no anxiety, no depression, no nervousness, no unusual recent stress  Eyes:   no blurry vision, no floaters, no recent vision changes, does not wears glasses or contacts  ENT:   no hearing loss, no loose or painful teeth, no dentures  Hematologic:  + easy bruising, no abnormal bleeding, no clotting disorder, no  frequent epistaxis  Endocrine:  no diabetes, does not check CBG's at home     Physical Exam:   BP (!) 148/67 (BP Location: Left Arm, Patient Position: Sitting, Cuff Size: Normal)   Pulse 85   Temp 97.7 F (36.5 C)   Resp 16   Ht 5\' 2"  (1.575 m)   Wt 172 lb (78 kg)   LMP 04/29/1991   SpO2 92% Comment: RA  BMI 31.46 kg/m   General:  Mildly obese,  well-appearing  HEENT:  Unremarkable   Neck:   no JVD, no bruits, no adenopathy   Chest:   clear to auscultation, symmetrical breath sounds, no wheezes, no rhonchi   CV:   RRR, grade III/VI systolic murmur   Abdomen:  soft, non-tender, no masses   Extremities:  warm, well-perfused, pulses diminished but palpable, no LE edema  Rectal/GU  Deferred  Neuro:   Grossly non-focal and symmetrical throughout  Skin:   Clean and dry, no rashes, no breakdown   Diagnostic Tests:  ECHOCARDIOGRAM REPORT       Patient Name:  Terri Edwards Date of Exam: 03/30/2019  Medical Rec #: [id number]     Height:    62.0 in  Accession #:  [id number]     Weight:    159.0 lb  Date of Birth: 11-07-1950     BSA:     1.73 m  Patient Age:  75 years      BP:      114/52 mmHg  Patient Gender: F         HR:      74 bpm.  Exam Location: Inpatient   Procedure: 2D Echo   Indications:  cardiomegaly 429.3    History:    Patient has prior history of Echocardiogram examinations,  most         recent 02/05/2017. Signs/Symptoms:Dyspnea; Risk         Factors:Hypertension, Current Smoker and Sleep Apnea.  Aortic         Valve: A    Sonographer:  Johny Chess  Referring Phys: [id number] RONDELL A SMITH   IMPRESSIONS    1. Left ventricular ejection fraction, by visual estimation, is 60 to  65%. The left ventricle has normal function. There is mildly increased  left ventricular hypertrophy.  2. Left ventricular diastolic parameters are consistent with Grade II    diastolic dysfunction (pseudonormalization).  3. Elevated left atrial pressure.  4. Global right ventricle has normal systolic function.The right  ventricular size is normal.  5. Left atrial size was severely dilated.  6. Right atrial size was mildly dilated.  7. Moderate mitral annular calcification.  8. The mitral valve is abnormal. Mild mitral valve regurgitation.  9. The tricuspid valve is normal in structure. Tricuspid valve  regurgitation is mild.  10. The pulmonic valve was not well visualized. Pulmonic valve  regurgitation is not visualized.  11. The tricuspid regurgitant velocity is 3.07 m/s, and with an assumed  right atrial pressure of 15 mmHg, the estimated right ventricular systolic  pressure is moderately elevated at 52.7 mmHg.  12. The inferior vena cava is dilated in size with <50% respiratory  variability, suggesting right atrial pressure of 15 mmHg.  13. 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  Consider gated CT to evaluate for stability  14. S/p Bioprosthetic aortic valve replacement. No regurgitation seen.  Severe bioprosthetic stenosis (Vmax 4.9 m/s, MG 60 mmHg, EOA 0.5 cm^2, DVI  0.27, AT 148 ms). Compared to prior TTE on 02/05/17, there is a marked  increase in mean gradients (43mmHg to  8mmHg).   FINDINGS  Left Ventricle: Left ventricular ejection fraction, by visual estimation,  is 60 to 65%. The left ventricle has normal function. The left ventricle  has no regional wall motion abnormalities. There is mildly increased left  ventricular hypertrophy. Left  ventricular diastolic parameters are consistent with Grade II diastolic  dysfunction (pseudonormalization). Elevated left atrial pressure.   Right Ventricle: The right ventricular size is normal. No increase in  right ventricular wall thickness. Global RV systolic function is has  normal systolic function. The tricuspid regurgitant velocity is 3.07 m/s,  and with an  assumed right atrial pressure  of 15 mmHg, the estimated right ventricular systolic pressure is  moderately elevated at 52.7 mmHg.   Left Atrium: Left atrial size was severely dilated.   Right Atrium: Right atrial size was mildly dilated   Pericardium: Trivial pericardial effusion is present.   Mitral Valve: The mitral valve is abnormal. There is mild thickening of  the mitral valve leaflet(s). Moderate mitral annular calcification. Mild  mitral valve regurgitation.   Tricuspid Valve: The tricuspid valve is normal in structure. Tricuspid  valve regurgitation is mild.   Aortic Valve: The aortic valve has been repaired/replaced. Aortic valve  regurgitation is not visualized. Aortic valve mean gradient measures 50.0  mmHg. Aortic valve peak gradient measures 79.9 mmHg. Aortic valve area, by  VTI measures 0.65 cm.  Bioprosthetic aortic valve replacement. No regurgitation seen. Severe  bioprosthetic stenosis (Vmax 4.9 m/s, MG 60 mmHg, EOA 0.7 cm^2, DVI 0.27).   Pulmonic Valve: The pulmonic valve was not well visualized. Pulmonic valve  regurgitation is not visualized. Pulmonic regurgitation is not visualized.   Aorta: The aortic root is normal  in size and structure.   Venous: The inferior vena cava is dilated in size with less than 50%  respiratory variability, suggesting right atrial pressure of 15 mmHg.   IAS/Shunts: The atrial septum is grossly normal.     LEFT VENTRICLE  PLAX 2D  LVIDd:     5.10 cm Diastology  LVIDs:     3.60 cm LV e' lateral:  8.81 cm/s  LV PW:     1.00 cm LV E/e' lateral: 15.3  LV IVS:    0.90 cm LV e' medial:  7.94 cm/s  LVOT diam:   1.50 cm LV E/e' medial: 17.0  LV SV:     69 ml  LV SV Index:  38.54  LVOT Area:   1.77 cm     RIGHT VENTRICLE  RV S prime:   10.30 cm/s  TAPSE (M-mode): 1.7 cm   LEFT ATRIUM       Index    RIGHT ATRIUM      Index  LA diam:    5.10 cm 2.94 cm/m RA Area:    19.40 cm  LA Vol (A2C):  97.1 ml 55.99 ml/m RA Volume:  52.10 ml 30.04 ml/m  LA Vol (A4C):  101.0 ml 58.24 ml/m  LA Biplane Vol: 101.0 ml 58.24 ml/m  AORTIC VALVE  AV Area (Vmax):  0.68 cm  AV Area (Vmean):  0.63 cm  AV Area (VTI):   0.65 cm  AV Vmax:      447.00 cm/s  AV Vmean:     334.000 cm/s  AV VTI:      1.150 m  AV Peak Grad:   79.9 mmHg  AV Mean Grad:   50.0 mmHg  LVOT Vmax:     172.00 cm/s  LVOT Vmean:    119.750 cm/s  LVOT VTI:     0.424 m  LVOT/AV VTI ratio: 0.37    AORTA  Ao Root diam: 2.70 cm   MITRAL VALVE             TRICUSPID VALVE  MV Area (PHT): 4.39 cm       TR Peak grad:  37.7 mmHg  MV PHT:    50.17 msec      TR Vmax:    307.00 cm/s  MV Decel Time: 173 msec  MV E velocity: 135.00 cm/s 103 cm/s SHUNTS  MV A velocity: 78.50 cm/s 70.3 cm/s Systemic VTI: 0.42 m  MV E/A ratio: 1.72    1.5    Systemic Diam: 1.50 cm     Oswaldo Milian MD  Electronically signed by Oswaldo Milian MD  Signature Date/Time: 03/30/2019/10:41:40 PM     RIGHT HEART CATH AND CORONARY ANGIOGRAPHY     Conclusion 1. No angiographic evidence of CAD  Surgical consultation for discussion regarding redo AVR and repair of LVOT false aneurysm.      Recommendations Antiplatelet/Anticoag Surgical consultation for discussion regarding redo AVR and repair of LVOT false aneurysm.     Surgeon Notes   03/31/2019 2:44 PM GI Operative Note signed by Jerene Bears, MD     Indications Aortic valve disorder [I35.9 (ICD-10-CM)]     Procedural Details Technical Details Indication: 69 yo female with history of AS s/p surgical AVR in 2013 with bioprosthetic AV now with severe stenosis of the valve with known pseudoaneurysm of the LVOT.   Procedure: The risks, benefits, complications, treatment options, and expected outcomes were discussed with the patient. The patient and/or family  concurred with the proposed plan, giving  informed consent. The patient was brought to the cath lab after IV hydration was given. The patient was sedated with Versed and Fentanyl. The IV catheter present in the right antecubital vein was changed for a 5 [place] sheath. Right heart catheterization performed with a balloon tipped catheter. The right wrist was prepped and draped in a sterile fashion. 1% lidocaine was used for local anesthesia. Using the modified Seldinger access technique, a 5 French sheath was placed in the right radial artery. 3 mg Verapamil was given through the sheath. 3500 units IV heparin was given. Standard diagnostic catheters were used to perform selective coronary angiography. I did not attempt to cross the aortic valve. The sheath was removed from the right radial artery and a Terumo hemostasis band was applied at the arteriotomy site on the right wrist.    Estimated blood loss <50 mL.   During this procedure medications were administered to achieve and maintain moderate conscious sedation while the patient's heart rate, blood pressure, and oxygen saturation were continuously monitored and I was present face-to-face 100% of this time.        Contrast Medication Name Total Dose  iohexol (OMNIPAQUE) 350 MG/ML injection 80 mL     Radiation/Fluoro Fluoro time: 7.3 (min)  DAP: 12623 (mGycm2)  Cumulative Air Kerma: 123456 (mGy)     Complications Complications documented before study signed (04/01/2019 9:09 AM)  RIGHT HEART CATH AND CORONARY ANGIOGRAPHY  None Documented by Burnell Blanks, MD 04/01/2019 8:52 AM  Date Found: 04/01/2019  Time Range: Intraprocedure       Coronary Findings Diagnostic Dominance: Right  Left Anterior Descending  Vessel is large.   Ramus Intermedius  Vessel is large.   Left Circumflex  Vessel is large.   Right Coronary Artery  Vessel is large.  Intervention No interventions have been documented.                             Coronary Diagrams Diagnostic Dominance: Right  &&&&&  Intervention      Implants  No implant documentation for this case.      [employer] Images Link to Procedure Log  Show images for CARDIAC CATHETERIZATION Procedure Log     Images on Long Term Storage   Show images for [place], Bogus         Hemo Data   Most Recent Value  Fick Cardiac Output 8.15 L/min  Fick Cardiac Output Index 4.55 (L/min)/BSA  RA A Wave 5 mmHg  RA V Wave 4 mmHg  RA Mean 2 mmHg  RV Systolic Pressure 41 mmHg  RV Diastolic Pressure 0 mmHg  RV EDP 4 mmHg  PA Systolic Pressure 40 mmHg  PA Diastolic Pressure 10 mmHg  PA Mean 24 mmHg  PW A Wave 12 mmHg  PW V Wave 20 mmHg  PW Mean 11 mmHg  AO Systolic Pressure [id number] mmHg  AO Diastolic Pressure 43 mmHg  AO Mean 67 mmHg  QP/QS 1  TPVR Index 5.28 HRUI  TSVR Index 14.74 HRUI  PVR SVR Ratio 0.2  TPVR/TSVR Ratio 0.36        Impression:  Patient has prosthetic valve dysfunction with severe symptomatic aortic stenosis status post aortic valve replacement using a stented bioprosthetic tissue valve in 2013.  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  She recently was hospitalized with acute exacerbation of symptoms of shortness of breath that occurred in the setting of acute blood loss anemia.  Symptoms have stabilized over the past several weeks and the patient's hemoglobin has reportedly increased.  Currently the patient reports stable symptoms of exertional shortness of breath and fatigue consistent with chronic diastolic congestive heart failure, New York Heart Association functional class IIb.  She is scheduled for follow-up EGD early next week to make sure that the small gastric ulcer seen on previous EGD has resolved.  I have personally reviewed the  patient's recent transthoracic echocardiogram and diagnostic cardiac catheterization.  Echocardiogram demonstrates significant thickening and restricted leaflet mobility involving all 3 leaflets of the bioprosthetic tissue valve in the aortic position.  Transvalvular gradient across the aortic valve was elevated greater than 4.6 m/s corresponding to mean transvalvular gradient estimated 50 mmHg.  Left ventricular systolic function remains normal.  I agree the patient needs redo aortic valve replacement.  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  Moreover, surgical intervention will come with added challenges and risks because of the false aneurysm, and I suspect the patient may need root replacement possibly using human allograft in order to definitively treat the false aneurysm.   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  The natural history of these problems were reviewed, as was her long term prognosis with medical therapy alone.  Surgical options were discussed at length including possible need for human allograft aortic root replacement were discussed.  The patient understands and accepts all potential associated risks of surgery including but not limited to risk of death, stroke, myocardial infarction, congestive heart failure, respiratory failure, renal failure, pneumonia, bleeding requiring blood transfusion and or reexploration, arrhythmia, heart block or bradycardia requiring permanent pacemaker, aortic dissection or other major vascular complication, pleural  effusions or other delayed complications related to continued congestive heart failure, and other late complications related to valve replacement including structural valve deterioration and failure, thrombosis, endocarditis, or paravalvular leak.  We tentatively plan to proceed with surgery on Wednesday, April 30, 2019 pending review of the patient's planned follow-up EGD which is currently scheduled for April 28, 2019.    I spent in excess of 60 minutes during the conduct of this office consultation and >50% of this time involved direct face-to-face encounter with the patient for counseling and/or coordination of their care.    Valentina Gu. Roxy Manns, MD 04/21/2019 12:56 PM

## 2019-04-21 NOTE — Patient Instructions (Addendum)
   Continue taking all current medications without change through the day before surgery.  Make sure to bring all of your medications with you when you come for your Pre-Admission Testing appointment at [facility] Short-Stay Department.  Have nothing to eat or drink after midnight the night before surgery.  On the morning of surgery take only Protonix with a sip of water.  At your appointment for Pre-Admission Testing at the [facility] Short-Stay Department you will be asked to sign permission forms for your upcoming surgery.  By definition your signature on these forms implies that you and/or your designee provide full informed consent for your planned surgical procedure(s), that alternative treatment options have been discussed, that you understand and accept any and all potential risks, and that you have some understanding of what to expect for your post-operative convalescence.  For any major cardiac surgical procedure potential operative risks include but are not limited to at least some risk of death, stroke or other neurologic complication, myocardial infarction, congestive heart failure, respiratory failure, renal failure, bleeding requiring blood transfusion and/or reexploration, irregular heart rhythm, heart block or bradycardia requiring permanent pacemaker, pneumonia, pericardial effusion, pleural effusion, wound infection, pulmonary embolus or other thromboembolic complication, chronic pain, or other complications related to the specific procedure(s) performed.  Please call to schedule a follow-up appointment in our office prior to surgery if you have any unresolved questions about your planned surgical procedure, the associated risks, alternative treatment options, and/or expectations for your post-operative recovery.    

## 2019-04-22 ENCOUNTER — Encounter: Payer: Self-pay | Admitting: *Deleted

## 2019-04-22 LAB — BASIC METABOLIC PANEL
BUN/Creatinine Ratio: 13 (ref 12–28)
BUN: 11 mg/dL (ref 8–27)
CO2: 24 mmol/L (ref 20–29)
Calcium: 9.2 mg/dL (ref 8.7–10.3)
Chloride: 99 mmol/L (ref 96–106)
Creatinine, Ser: 0.82 mg/dL (ref 0.57–1.00)
GFR calc Af Amer: 84 mL/min/{1.73_m2} (ref >59)
GFR calc non Af Amer: 73 mL/min/{1.73_m2} (ref >59)
Glucose: 155 mg/dL — ABNORMAL HIGH (ref 65–99)
Potassium: 4.6 mmol/L (ref 3.5–5.2)
Sodium: 139 mmol/L (ref 134–144)

## 2019-04-22 LAB — CBC
Hematocrit: 38.6 % (ref 34.0–46.6)
Hemoglobin: 12.1 g/dL (ref 11.1–15.9)
MCH: 28.9 pg (ref 26.6–33.0)
MCHC: 31.3 g/dL — ABNORMAL LOW (ref 31.5–35.7)
MCV: 92 fL (ref 79–97)
Platelets: 302 10*3/uL (ref 150–450)
RBC: 4.18 x10E6/uL (ref 3.77–5.28)
RDW: 15.9 % — ABNORMAL HIGH (ref 11.7–15.4)
WBC: 9.5 10*3/uL (ref 3.4–10.8)

## 2019-04-24 ENCOUNTER — Other Ambulatory Visit: Payer: Self-pay | Admitting: Internal Medicine

## 2019-04-24 ENCOUNTER — Ambulatory Visit (INDEPENDENT_AMBULATORY_CARE_PROVIDER_SITE_OTHER): Payer: Medicare Other

## 2019-04-24 DIAGNOSIS — Z1159 Encounter for screening for other viral diseases: Secondary | ICD-10-CM

## 2019-04-25 ENCOUNTER — Ambulatory Visit (HOSPITAL_COMMUNITY): Payer: Medicare Other

## 2019-04-25 LAB — SARS CORONAVIRUS 2 (TAT 6-24 HRS): SARS Coronavirus 2: NEGATIVE

## 2019-04-28 ENCOUNTER — Encounter: Payer: Self-pay | Admitting: Internal Medicine

## 2019-04-28 ENCOUNTER — Ambulatory Visit (AMBULATORY_SURGERY_CENTER): Payer: Medicare Other | Admitting: Internal Medicine

## 2019-04-28 ENCOUNTER — Telehealth: Payer: Self-pay

## 2019-04-28 ENCOUNTER — Other Ambulatory Visit: Payer: Self-pay

## 2019-04-28 ENCOUNTER — Telehealth (HOSPITAL_COMMUNITY): Payer: Self-pay | Admitting: Emergency Medicine

## 2019-04-28 VITALS — BP 146/68 | HR 65 | Temp 98.2°F | Resp 20 | Ht 62.0 in | Wt 172.0 lb

## 2019-04-28 DIAGNOSIS — K259 Gastric ulcer, unspecified as acute or chronic, without hemorrhage or perforation: Secondary | ICD-10-CM

## 2019-04-28 DIAGNOSIS — K449 Diaphragmatic hernia without obstruction or gangrene: Secondary | ICD-10-CM | POA: Diagnosis not present

## 2019-04-28 MED ORDER — SODIUM CHLORIDE 0.9 % IV SOLN: 500.0000 mL | INTRAVENOUS | Status: DC

## 2019-04-28 NOTE — Op Note (Signed)
[place] Patient Name: Terri Edwards Procedure Date: 04/28/2019 10:25 AM MRN: [id number] Endoscopist: Jerene Bears , MD Age: 69 Referring MD:  Date of Birth: 1950-04-25 Gender: Female Account #: [id number] Procedure:                Upper GI endoscopy Indications:              Follow-up of acute gastric ulcer (seen at EGD in                            Jan 2021), history of IDA with heme + stools Medicines:                Monitored Anesthesia Care Procedure:                Pre-Anesthesia Assessment:                           - Prior to the procedure, a History and Physical                            was performed, and patient medications and                            allergies were reviewed. The patient's tolerance of                            previous anesthesia was also reviewed. The risks                            and benefits of the procedure and the sedation                            options and risks were discussed with the patient.                            All questions were answered, and informed consent                            was obtained. Prior Anticoagulants: The patient has                            taken no previous anticoagulant or antiplatelet                            agents. ASA Grade Assessment: III - A patient with                            severe systemic disease. After reviewing the risks                            and benefits, the patient was deemed in                            satisfactory condition to undergo the procedure.  After obtaining informed consent, the endoscope was                            passed under direct vision. Throughout the                            procedure, the patient's blood pressure, pulse, and                            oxygen saturations were monitored continuously. The                            Endoscope was introduced through the mouth, and                            advanced  to the second part of duodenum. The upper                            GI endoscopy was accomplished without difficulty.                            The patient tolerated the procedure well. Scope In: Scope Out: Findings:                 Normal mucosa was found in the entire esophagus.                           A 4 cm hiatal hernia was present.                           The entire examined stomach was normal. The                            previously seen gastric ulcer has healed.                           The examined duodenum was normal. Complications:            No immediate complications. Estimated Blood Loss:     Estimated blood loss: none. Impression:               - Normal mucosa was found in the entire esophagus.                           - 4 cm hiatal hernia.                           - Normal stomach.                           - Normal examined duodenum.                           - No specimens collected. Recommendation:           - Patient has a contact number available for  emergencies. The signs and symptoms of potential                            delayed complications were discussed with the                            patient. Return to normal activities tomorrow.                            Written discharge instructions were provided to the                            patient.                           - Resume previous diet.                           - Continue present medications. Can reduce                            pantoprazole to 40 mg once daily.                           - After aortic valve repair, patient should                            follow-up with me and we can discuss colonoscopy                            which remains recommended.                           - In the interim Hgb and iron studies should be                            monitored. IV iron can be repeated when needed. Jerene Bears, MD 04/28/2019 10:43:46 AM This report  has been signed electronically.

## 2019-04-28 NOTE — Telephone Encounter (Signed)
LMOM for Terri Edwards to take 2 tablets of Toprol XL 25 mg 2 hours prior to CT Scan scheduled for tomorrow. If she has questions please call our office.

## 2019-04-28 NOTE — Telephone Encounter (Signed)
Reaching out to patient to offer assistance regarding upcoming cardiac imaging study; pt verbalizes understanding of appt date/time, parking situation and where to check in, pre-test NPO status and medications ordered, and verified current allergies; name and call back number provided for further questions should they arise Terri Bond RN Navigator Cardiac Imaging [facility] [phone] office [phone] cell  Pt verbalized understanding to take 2 Toprol XL tabs (totalling 50mg ) 2 hr prior to scan for HR control

## 2019-04-28 NOTE — Progress Notes (Signed)
PT taken to PACU. Monitors in place. VSS. Report given to RN. 

## 2019-04-28 NOTE — Telephone Encounter (Signed)
-----   Message from Laury Deep, RN sent at 04/28/2019 10:00 AM EST ----- Can you please call this patient and tell her to double her ToprolXL prior to her CT scan ordered by Dr. Roxy Manns.   I would have her take 2 the morning of her test (2 hr prior to scan).  Her scan is tomorrow.\  Thanks, Ryan ----- Message ----- From: Rexene Alberts, MD Sent: 04/25/2019   4:16 PM EST To: Laury Deep, RN  Yes that's ok ----- Message ----- From: Laury Deep, RN Sent: 04/25/2019   2:54 PM EST To: Rexene Alberts, MD  Hey,   Here is another one:  Beta blocker for HR ?Marland Kitchen She already takes 25mg  toprol xl so idk if we want to double up the morning of test or add meto tartrate as one time dose?    Let me know!  Thanks, [employer]

## 2019-04-28 NOTE — Patient Instructions (Signed)
Please read handouts provided. Continue present medications. Reduce pantoprazole to 40 mg once daily. Follow-up with Dr. Hilarie Fredrickson after aortic valve repair.      YOU HAD AN ENDOSCOPIC PROCEDURE TODAY AT [facility]:   Refer to the procedure report that was given to you for any specific questions about what was found during the examination.  If the procedure report does not answer your questions, please call your gastroenterologist to clarify.  If you requested that your care partner not be given the details of your procedure findings, then the procedure report has been included in a sealed envelope for you to review at your convenience later.  YOU SHOULD EXPECT: Some feelings of bloating in the abdomen. Passage of more gas than usual.  Walking can help get rid of the air that was put into your GI tract during the procedure and reduce the bloating. If you had a lower endoscopy (such as a colonoscopy or flexible sigmoidoscopy) you may notice spotting of blood in your stool or on the toilet paper. If you underwent a bowel prep for your procedure, you may not have a normal bowel movement for a few days.  Please Note:  You might notice some irritation and congestion in your nose or some drainage.  This is from the oxygen used during your procedure.  There is no need for concern and it should clear up in a day or so.  SYMPTOMS TO REPORT IMMEDIATELY:    Following upper endoscopy (EGD)  Vomiting of blood or coffee ground material  New chest pain or pain under the shoulder blades  Painful or persistently difficult swallowing  New shortness of breath  Fever of 100F or higher  Black, tarry-looking stools  For urgent or emergent issues, a gastroenterologist can be reached at any hour by calling [phone].   DIET:  We do recommend a small meal at first, but then you may proceed to your regular diet.  Drink plenty of fluids but you should avoid alcoholic beverages for 24  hours.  ACTIVITY:  You should plan to take it easy for the rest of today and you should NOT DRIVE or use heavy machinery until tomorrow (because of the sedation medicines used during the test).    FOLLOW UP: Our staff will call the number listed on your records 48-72 hours following your procedure to check on you and address any questions or concerns that you may have regarding the information given to you following your procedure. If we do not reach you, we will leave a message.  We will attempt to reach you two times.  During this call, we will ask if you have developed any symptoms of COVID 19. If you develop any symptoms (ie: fever, flu-like symptoms, shortness of breath, cough etc.) before then, please call [phone].  If you test positive for Covid 19 in the 2 weeks post procedure, please call and report this information to [place].    If any biopsies were taken you will be contacted by phone or by letter within the next 1-3 weeks.  Please call us at [phone] if you have not heard about the biopsies in 3 weeks.    SIGNATURES/CONFIDENTIALITY: You and/or your care partner have signed paperwork which will be entered into your electronic medical record.  These signatures attest to the fact that that the information above on your After Visit Summary has been reviewed and is understood.  Full responsibility of the confidentiality of this discharge information lies  with you and/or your care-partner.

## 2019-04-28 NOTE — Progress Notes (Signed)
Addendum: Reviewed and agree with assessment and management plan. Colonoscopy deferred but needs to be considered after redo aortic valve Shaneece Stockburger, Lajuan Lines, MD

## 2019-04-28 NOTE — Progress Notes (Signed)
Pt arrived to [facility] with pulse ox of 90%.  Per Hebert Soho CRNA had her use her inhaler.  Also instructed her to cough and take several deep breaths.  Sats no reading 92-93%.  Reported to CRNA.

## 2019-04-29 ENCOUNTER — Ambulatory Visit (HOSPITAL_COMMUNITY)
Admission: RE | Admit: 2019-04-29 | Discharge: 2019-04-29 | Disposition: A | Discharge status: Home / Self Care | Payer: Medicare Other | Source: Ambulatory Visit | Attending: Thoracic Surgery (Cardiothoracic Vascular Surgery) | Admitting: Thoracic Surgery (Cardiothoracic Vascular Surgery)

## 2019-04-29 ENCOUNTER — Encounter (HOSPITAL_COMMUNITY): Payer: Self-pay

## 2019-04-29 DIAGNOSIS — I253 Aneurysm of heart: Secondary | ICD-10-CM

## 2019-04-29 DIAGNOSIS — Z01818 Encounter for other preprocedural examination: Secondary | ICD-10-CM | POA: Diagnosis present

## 2019-04-29 DIAGNOSIS — I71019 Dissection of thoracic aorta, unspecified: Secondary | ICD-10-CM

## 2019-04-29 DIAGNOSIS — Z953 Presence of xenogenic heart valve: Secondary | ICD-10-CM | POA: Insufficient documentation

## 2019-04-29 DIAGNOSIS — I350 Nonrheumatic aortic (valve) stenosis: Secondary | ICD-10-CM | POA: Diagnosis present

## 2019-04-29 DIAGNOSIS — I7101 Dissection of thoracic aorta: Secondary | ICD-10-CM | POA: Diagnosis present

## 2019-04-29 MED ORDER — IOHEXOL 350 MG/ML SOLN
100.0000 mL | Freq: Once | INTRAVENOUS | Status: AC | PRN
  Administered 2019-04-29: 100 mL via INTRAVENOUS

## 2019-04-30 ENCOUNTER — Telehealth: Payer: Self-pay

## 2019-04-30 NOTE — Telephone Encounter (Signed)
Attempted to reach patient for post-procedure f/u call. No answer. Left message for her to please not hesitate to call us if she has any questions/concerns regarding her care. 

## 2019-04-30 NOTE — Telephone Encounter (Signed)
NO ANSWER, MESSAGE LEFT FOR PATIENT. 

## 2019-05-01 ENCOUNTER — Ambulatory Visit: Payer: Medicare Other | Admitting: Physician Assistant

## 2019-05-01 NOTE — Progress Notes (Signed)
[id number] - [place], [place] - [address] [place] [address] Phone: [phone] Fax: [phone]      Your procedure is scheduled on 01-Jun-2019.  Report to [facility] Main Entrance "A" at 5:30 A.M., and check in at the Admitting office.  Call this number if you have problems the morning of surgery:  [phone]  Call [phone] if you have any questions prior to your surgery date Monday-Friday 8am-4pm    Remember:  Do not eat or drink after midnight the night before your surgery     Take these medicines the morning of surgery with A SIP OF WATER: metoprolol succinate (TOPROL-XL) pantoprazole (PROTONIX)  acetaminophen (TYLENOL) - if needed albuterol (PROVENTIL HFA;VENTOLIN HFA) 108 - if needed carboxymethylcellulose (LUBRICANT EYE DROPS) - if needed dextromethorphan (DELSYM) - if needed  Please bring all inhalers with you the day of surgery.    7 days prior to surgery STOP taking any Aspirin (unless otherwise instructed by your surgeon), Aleve, Naproxen, Ibuprofen, Motrin, Advil, Goody's, BC's, all herbal medications, fish oil, and all vitamins.    The Morning of Surgery  Do not wear jewelry, make-up or nail polish.  Do not wear lotions, powders, perfumes or deodorant  Do not shave 48 hours prior to surgery.  Do not bring valuables to the hospital.  [facility] is not responsible for any belongings or valuables.  If you are a smoker, DO NOT Smoke 24 hours prior to surgery  If you wear a CPAP at night please bring your mask the morning of surgery   Remember that you must have someone to transport you home after your surgery, and remain with you for 24 hours if you are discharged the same day.   Please bring cases for contacts, glasses, hearing aids, dentures or bridgework because it cannot be worn into surgery.    Leave your suitcase in the car.  After surgery it may be brought to your  room.  For patients admitted to the hospital, discharge time will be determined by your treatment team.  Patients discharged the day of surgery will not be allowed to drive home.    Special instructions:   [facility]- Preparing For Surgery  Before surgery, you can play an important role. Because skin is not sterile, your skin needs to be as free of germs as possible. You can reduce the number of germs on your skin by washing with CHG (chlorahexidine gluconate) Soap before surgery.  CHG is an antiseptic cleaner which kills germs and bonds with the skin to continue killing germs even after washing.    Oral Hygiene is also important to reduce your risk of infection.  Remember - BRUSH YOUR TEETH THE MORNING OF SURGERY WITH YOUR REGULAR TOOTHPASTE  Please do not use if you have an allergy to CHG or antibacterial soaps. If your skin becomes reddened/irritated stop using the CHG.  Do not shave (including legs and underarms) for at least 48 hours prior to first CHG shower. It is OK to shave your face.  Please follow these instructions carefully.   1. Shower the NIGHT BEFORE SURGERY and the MORNING OF SURGERY with CHG Soap.   2. If you chose to wash your hair, wash your hair first as usual with your normal shampoo.  3. After you shampoo, rinse your hair and body thoroughly to remove the shampoo.  4. Use CHG as you would any other liquid soap. You can apply  CHG directly to the skin and wash gently with a scrungie or a clean washcloth.   5. Apply the CHG Soap to your body ONLY FROM THE NECK DOWN.  Do not use on open wounds or open sores. Avoid contact with your eyes, ears, mouth and genitals (private parts). Wash Face and genitals (private parts)  with your normal soap.   6. Wash thoroughly, paying special attention to the area where your surgery will be performed.  7. Thoroughly rinse your body with warm water from the neck down.  8. DO NOT shower/wash with your normal soap after using and  rinsing off the CHG Soap.  9. Pat yourself dry with a CLEAN TOWEL.  10. Wear CLEAN PAJAMAS to bed the night before surgery, wear comfortable clothes the morning of surgery  11. Place CLEAN SHEETS on your bed the night of your first shower and DO NOT SLEEP WITH PETS.    Day of Surgery:  Please shower the morning of surgery with the CHG soap Do not apply any deodorants/lotions. Please wear clean clothes to the hospital/surgery center.   Remember to brush your teeth WITH YOUR REGULAR TOOTHPASTE.   Please read over the following fact sheets that you were given.

## 2019-05-02 ENCOUNTER — Other Ambulatory Visit (HOSPITAL_COMMUNITY)
Admission: RE | Admit: 2019-05-02 | Discharge: 2019-05-02 | Disposition: A | Discharge status: Home / Self Care | Payer: Medicare Other | Source: Ambulatory Visit | Attending: Thoracic Surgery (Cardiothoracic Vascular Surgery) | Admitting: Thoracic Surgery (Cardiothoracic Vascular Surgery)

## 2019-05-02 ENCOUNTER — Other Ambulatory Visit: Payer: Self-pay

## 2019-05-02 ENCOUNTER — Ambulatory Visit (HOSPITAL_COMMUNITY)
Admission: RE | Admit: 2019-05-02 | Discharge: 2019-05-02 | Disposition: A | Discharge status: Home / Self Care | Payer: Medicare Other | Source: Ambulatory Visit | Attending: Thoracic Surgery (Cardiothoracic Vascular Surgery) | Admitting: Thoracic Surgery (Cardiothoracic Vascular Surgery)

## 2019-05-02 ENCOUNTER — Encounter (HOSPITAL_COMMUNITY): Payer: Self-pay

## 2019-05-02 ENCOUNTER — Encounter (HOSPITAL_COMMUNITY)
Admission: RE | Admit: 2019-05-02 | Discharge: 2019-05-02 | Disposition: A | Discharge status: Home / Self Care | Payer: Medicare Other | Source: Ambulatory Visit | Attending: Thoracic Surgery (Cardiothoracic Vascular Surgery) | Admitting: Thoracic Surgery (Cardiothoracic Vascular Surgery)

## 2019-05-02 DIAGNOSIS — X58XXXA Exposure to other specified factors, initial encounter: Secondary | ICD-10-CM | POA: Diagnosis not present

## 2019-05-02 DIAGNOSIS — T8209XA Other mechanical complication of heart valve prosthesis, initial encounter: Secondary | ICD-10-CM | POA: Insufficient documentation

## 2019-05-02 DIAGNOSIS — R9431 Abnormal electrocardiogram [ECG] [EKG]: Secondary | ICD-10-CM | POA: Insufficient documentation

## 2019-05-02 DIAGNOSIS — Z20822 Contact with and (suspected) exposure to covid-19: Secondary | ICD-10-CM | POA: Diagnosis not present

## 2019-05-02 HISTORY — DX: Cardiac arrhythmia, unspecified: I49.9

## 2019-05-02 HISTORY — DX: Dyspnea, unspecified: R06.00

## 2019-05-02 LAB — APTT: aPTT: 29 seconds (ref 24–36)

## 2019-05-02 LAB — URINALYSIS, ROUTINE W REFLEX MICROSCOPIC
Bilirubin Urine: NEGATIVE
Glucose, UA: NEGATIVE mg/dL
Hgb urine dipstick: NEGATIVE
Ketones, ur: NEGATIVE mg/dL
Leukocytes,Ua: NEGATIVE
Nitrite: NEGATIVE
Protein, ur: NEGATIVE mg/dL
Specific Gravity, Urine: 1.012 (ref 1.005–1.030)
pH: 6 (ref 5.0–8.0)

## 2019-05-02 LAB — COMPREHENSIVE METABOLIC PANEL
ALT: 21 U/L (ref 0–44)
AST: 24 U/L (ref 15–41)
Albumin: 3.7 g/dL (ref 3.5–5.0)
Alkaline Phosphatase: 77 U/L (ref 38–126)
Anion gap: 11 (ref 5–15)
BUN: 6 mg/dL — ABNORMAL LOW (ref 8–23)
CO2: 25 mmol/L (ref 22–32)
Calcium: 9.2 mg/dL (ref 8.9–10.3)
Chloride: 104 mmol/L (ref 98–111)
Creatinine, Ser: 0.78 mg/dL (ref 0.44–1.00)
GFR calc Af Amer: >60 mL/min (ref >60)
GFR calc non Af Amer: >60 mL/min (ref >60)
Glucose, Bld: 96 mg/dL (ref 70–99)
Potassium: 4.2 mmol/L (ref 3.5–5.1)
Sodium: 140 mmol/L (ref 135–145)
Total Bilirubin: 0.8 mg/dL (ref 0.3–1.2)
Total Protein: 6.8 g/dL (ref 6.5–8.1)

## 2019-05-02 LAB — CBC
HCT: 44.8 % (ref 36.0–46.0)
Hemoglobin: 13.5 g/dL (ref 12.0–15.0)
MCH: 28.9 pg (ref 26.0–34.0)
MCHC: 30.1 g/dL (ref 30.0–36.0)
MCV: 95.9 fL (ref 80.0–100.0)
Platelets: 241 10*3/uL (ref 150–400)
RBC: 4.67 MIL/uL (ref 3.87–5.11)
RDW: 19 % — ABNORMAL HIGH (ref 11.5–15.5)
WBC: 8.9 10*3/uL (ref 4.0–10.5)
nRBC: 0 % (ref 0.0–0.2)

## 2019-05-02 LAB — BLOOD GAS, ARTERIAL
Acid-Base Excess: 5.2 mmol/L — ABNORMAL HIGH (ref 0.0–2.0)
Bicarbonate: 29.5 mmol/L — ABNORMAL HIGH (ref 20.0–28.0)
Drawn by: 421801
FIO2: 21
O2 Saturation: 96.2 %
Patient temperature: 37
pCO2 arterial: 45.8 mmHg (ref 32.0–48.0)
pH, Arterial: 7.425 (ref 7.350–7.450)
pO2, Arterial: 77.3 mmHg — ABNORMAL LOW (ref 83.0–108.0)

## 2019-05-02 LAB — HEMOGLOBIN A1C
Hgb A1c MFr Bld: 4.9 % (ref 4.8–5.6)
Mean Plasma Glucose: 93.93 mg/dL

## 2019-05-02 LAB — SURGICAL PCR SCREEN
MRSA, PCR: NEGATIVE
Staphylococcus aureus: NEGATIVE

## 2019-05-02 LAB — SARS CORONAVIRUS 2 (TAT 6-24 HRS): SARS Coronavirus 2: NEGATIVE

## 2019-05-02 LAB — PROTIME-INR
INR: 1 (ref 0.8–1.2)
Prothrombin Time: 13.4 seconds (ref 11.4–15.2)

## 2019-05-02 NOTE — Progress Notes (Signed)
PCP - Dr. Asencion Noble Cardiologist - Dr. Kirk Ruths GI: Dr. Zenovia Jarred  PPM/ICD - Denies  Chest x-ray - 05/02/2019 EKG - 05/02/2019 Stress Test - Denies ECHO - 03/30/2019 Cardiac Cath - 04/01/2019  Sleep Study - Denies  Pt denies being diabetic.   Blood Thinner Instructions: N/A Aspirin Instructions: N/A  ERAS Protcol - No  COVID TEST- 05/02/2019   Coronavirus Screening  Have you experienced the following symptoms:  Cough yes/no: No Fever (>100.86F)  yes/no: No Runny nose yes/no: No Sore throat yes/no: No Difficulty breathing/shortness of breath  yes/no: No  Have you or a family member traveled in the last 14 days and where? yes/no: No   If the patient indicates "YES" to the above questions, their PAT will be rescheduled to limit the exposure to others and, the surgeon will be notified. THE PATIENT WILL NEED TO BE ASYMPTOMATIC FOR 14 DAYS.   If the patient is not experiencing any of these symptoms, the PAT nurse will instruct them to NOT bring anyone with them to their appointment since they may have these symptoms or traveled as well.   Please remind your patients and families that hospital visitation restrictions are in effect and the importance of the restrictions.     Anesthesia review: Yes, cardiac hx; abnormal EKG  Patient denies shortness of breath, fever, cough and chest pain at PAT appointment   All instructions explained to the patient, with a verbal understanding of the material. Patient agrees to go over the instructions while at home for a better understanding. Patient also instructed to self quarantine after being tested for COVID-19. The opportunity to ask questions was provided.

## 2019-05-05 ENCOUNTER — Other Ambulatory Visit: Payer: Self-pay

## 2019-05-05 ENCOUNTER — Encounter: Payer: Self-pay | Admitting: Thoracic Surgery (Cardiothoracic Vascular Surgery)

## 2019-05-05 ENCOUNTER — Ambulatory Visit (INDEPENDENT_AMBULATORY_CARE_PROVIDER_SITE_OTHER): Payer: Medicare Other | Admitting: Thoracic Surgery (Cardiothoracic Vascular Surgery)

## 2019-05-05 VITALS — BP 133/73 | HR 82 | Temp 97.7°F | Resp 16 | Ht 62.0 in | Wt 172.0 lb

## 2019-05-05 DIAGNOSIS — I350 Nonrheumatic aortic (valve) stenosis: Secondary | ICD-10-CM

## 2019-05-05 DIAGNOSIS — Z953 Presence of xenogenic heart valve: Secondary | ICD-10-CM | POA: Diagnosis not present

## 2019-05-05 DIAGNOSIS — I729 Aneurysm of unspecified site: Secondary | ICD-10-CM | POA: Diagnosis not present

## 2019-05-05 DIAGNOSIS — T8209XA Other mechanical complication of heart valve prosthesis, initial encounter: Secondary | ICD-10-CM | POA: Insufficient documentation

## 2019-05-05 DIAGNOSIS — T8209XD Other mechanical complication of heart valve prosthesis, subsequent encounter: Secondary | ICD-10-CM

## 2019-05-05 MED ORDER — NITROGLYCERIN IN D5W 200-5 MCG/ML-% IV SOLN
2.0000 ug/min | INTRAVENOUS | Status: DC
  Filled 2019-05-05: qty 250

## 2019-05-05 MED ORDER — INSULIN REGULAR(HUMAN) IN NACL 100-0.9 UT/100ML-% IV SOLN
INTRAVENOUS | Status: AC
  Administered 2019-05-06: .9 [IU]/h via INTRAVENOUS
  Filled 2019-05-05: qty 100

## 2019-05-05 MED ORDER — POTASSIUM CHLORIDE 2 MEQ/ML IV SOLN
80.0000 meq | INTRAVENOUS | Status: DC
  Filled 2019-05-05: qty 40

## 2019-05-05 MED ORDER — TRANEXAMIC ACID (OHS) PUMP PRIME SOLUTION
2.0000 mg/kg | INTRAVENOUS | Status: DC
  Filled 2019-05-05: qty 1.54

## 2019-05-05 MED ORDER — TRANEXAMIC ACID (OHS) BOLUS VIA INFUSION
15.0000 mg/kg | INTRAVENOUS | Status: AC
  Administered 2019-05-06: 1156.5 mg via INTRAVENOUS
  Filled 2019-05-05: qty 1157

## 2019-05-05 MED ORDER — PHENYLEPHRINE HCL-NACL 20-0.9 MG/250ML-% IV SOLN
30.0000 ug/min | INTRAVENOUS | Status: AC
  Administered 2019-05-06: 50 ug/min via INTRAVENOUS
  Filled 2019-05-05: qty 250

## 2019-05-05 MED ORDER — PLASMA-LYTE 148 IV SOLN
INTRAVENOUS | Status: AC
  Administered 2019-05-06: 500 mL
  Filled 2019-05-05: qty 2.5

## 2019-05-05 MED ORDER — MILRINONE LACTATE IN DEXTROSE 20-5 MG/100ML-% IV SOLN
0.3000 ug/kg/min | INTRAVENOUS | Status: AC
  Administered 2019-05-06: .2 ug/kg/min via INTRAVENOUS
  Filled 2019-05-05: qty 100

## 2019-05-05 MED ORDER — DEXMEDETOMIDINE HCL IN NACL 400 MCG/100ML IV SOLN
0.1000 ug/kg/h | INTRAVENOUS | Status: AC
  Administered 2019-05-06: .3 ug/kg/h via INTRAVENOUS
  Filled 2019-05-05: qty 100

## 2019-05-05 MED ORDER — VANCOMYCIN HCL 1000 MG IV SOLR
INTRAVENOUS | Status: AC
  Administered 2019-05-06: 13:00:00 1000 mL
  Filled 2019-05-05: qty 1000

## 2019-05-05 MED ORDER — TRANEXAMIC ACID 1000 MG/10ML IV SOLN
1.5000 mg/kg/h | INTRAVENOUS | Status: AC
  Administered 2019-05-06: 1.5 mg/kg/h via INTRAVENOUS
  Filled 2019-05-05: qty 25

## 2019-05-05 MED ORDER — SODIUM CHLORIDE 0.9 % IV SOLN
750.0000 mg | INTRAVENOUS | Status: DC
  Filled 2019-05-05: qty 750

## 2019-05-05 MED ORDER — SODIUM CHLORIDE 0.9 % IV SOLN
INTRAVENOUS | Status: DC
  Filled 2019-05-05: qty 30

## 2019-05-05 MED ORDER — SODIUM CHLORIDE 0.9 % IV SOLN
1.5000 g | INTRAVENOUS | Status: AC
  Administered 2019-05-06: 1.5 g via INTRAVENOUS
  Filled 2019-05-05: qty 1.5

## 2019-05-05 MED ORDER — EPINEPHRINE HCL 5 MG/250ML IV SOLN IN NS
0.0000 ug/min | INTRAVENOUS | Status: DC
  Filled 2019-05-05: qty 250

## 2019-05-05 MED ORDER — MANNITOL 20 % IV SOLN
Freq: Once | INTRAVENOUS | Status: DC
  Filled 2019-05-05: qty 13

## 2019-05-05 MED ORDER — NOREPINEPHRINE 4 MG/250ML-% IV SOLN
0.0000 ug/min | INTRAVENOUS | Status: DC
  Filled 2019-05-05: qty 250

## 2019-05-05 MED ORDER — VANCOMYCIN HCL 1250 MG/250ML IV SOLN
1250.0000 mg | INTRAVENOUS | Status: AC
  Administered 2019-05-06: 1250 mg via INTRAVENOUS
  Filled 2019-05-05: qty 250

## 2019-05-05 NOTE — Progress Notes (Signed)
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  The patient was last seen here in our office on April 21, 2019.  She reports no new problems or complaints over the past 2 weeks.   Current Outpatient Medications  Medication Sig Dispense Refill  . acetaminophen (TYLENOL) 500 MG tablet Take 500 mg by mouth every 6 (six) hours as needed for mild pain.    Marland Kitchen albuterol (PROVENTIL HFA;VENTOLIN HFA) 108 (90 Base) MCG/ACT inhaler Inhale 2 puffs into the lungs every 4 (four) hours as needed for wheezing or shortness of breath.    Marland Kitchen amoxicillin (AMOXIL) 500 MG capsule Take 4 tablets = 2 grams 30-60 minutes before dental procedures (Patient taking differently: Take 2,000 mg by mouth See admin instructions. Take 4 capsules (2000 mg) by mouth 1 hour prior to dental procedures) 4 capsule 6  . carboxymethylcellulose (LUBRICANT EYE DROPS) 0.5 % SOLN Place 1 drop into both eyes 3 (three) times daily as needed (dry/irritated eyes.).    Marland Kitchen dextromethorphan (DELSYM) 30 MG/5ML liquid Take 30 mg by mouth 2 (two) times daily as needed for cough.     . ferrous sulfate 325 (65 FE) MG tablet Take 1 tablet (325 mg total) by mouth 2 (two) times daily with a meal. 60 tablet 1  . metoprolol succinate (TOPROL-XL) 25 MG 24 hr tablet Take 1 tablet (25 mg total) by mouth every 12  (twelve) hours. 180 tablet 3  . pantoprazole (PROTONIX) 40 MG tablet Take one tablet twice daily for 8 weeks. (Patient taking differently: Take 40 mg by mouth daily. ) 60 tablet 1  . SODIUM FLUORIDE 5000 PPM 1.1 % PSTE Place 1 application onto teeth 2 (two) times daily.      No current facility-administered medications for this visit.   Facility-Administered Medications Ordered in Other Visits  Medication Dose Route Frequency Provider Last Rate Last Admin  . [START ON May 26, 2019] cefUROXime (ZINACEF) 1.5 g in sodium chloride 0.9 % 100 mL IVPB  1.5 g Intravenous To OR Rexene Alberts, MD      . Derrill Memo ON 2019/05/26] cefUROXime (ZINACEF) 750 mg in sodium chloride 0.9 % 100 mL IVPB  750 mg Intravenous To OR Rexene Alberts, MD      . Derrill Memo ON 26-May-2019] dexmedetomidine (PRECEDEX) 400 MCG/100ML (4 mcg/mL) infusion  0.1-0.7 mcg/kg/hr Intravenous To OR Rexene Alberts, MD      . Derrill Memo ON 05/26/19] EPINEPHrine (ADRENALIN) 4 mg in NS 250 mL (0.016 mg/mL) premix infusion  0-10 mcg/min Intravenous To OR Rexene Alberts, MD      . Derrill Memo ON 26-May-2019] heparin 2,500 Units, papaverine 30 mg in electrolyte-148 (PLASMALYTE-148) 500 mL irrigation   Irrigation To OR Rexene Alberts, MD      . Derrill Memo ON 05-26-19] heparin 30,000 units/NS 1000 mL solution for  CELLSAVER   Other To OR Rexene Alberts, MD      . Derrill Memo ON 15-May-2019] insulin regular, human (MYXREDLIN) 100 units/ 100 mL infusion   Intravenous To OR Rexene Alberts, MD      . Derrill Memo ON 15-May-2019] [facility] Blood Cardioplegia vial (lidocaine/magnesium/mannitol 0.26g-4g-6.4g)   Intracoronary Once Rexene Alberts, MD      . Derrill Memo ON 05/15/2019] milrinone (PRIMACOR) 20 MG/100 ML (0.2 mg/mL) infusion  0.3 mcg/kg/min Intravenous To OR Rexene Alberts, MD      . Derrill Memo ON 05/15/19] nitroGLYCERIN 50 mg in dextrose 5 % 250 mL (0.2 mg/mL) infusion  2-200 mcg/min Intravenous To OR Rexene Alberts, MD      . Derrill Memo ON 2019-05-15] norepinephrine (LEVOPHED)  4mg  in 241mL premix infusion  0-40 mcg/min Intravenous To OR Rexene Alberts, MD      . Derrill Memo ON 05/15/2019] phenylephrine (NEOSYNEPHRINE) 20-0.9 MG/250ML-% infusion  30-200 mcg/min Intravenous To OR Rexene Alberts, MD      . Derrill Memo ON May 15, 2019] potassium chloride injection 80 mEq  80 mEq Other To OR Rexene Alberts, MD      . Derrill Memo ON 05-15-2019] tranexamic acid (CYKLOKAPRON) 2,500 mg in sodium chloride 0.9 % 250 mL (10 mg/mL) infusion  1.5 mg/kg/hr Intravenous To OR Rexene Alberts, MD      . Derrill Memo ON 05-15-2019] tranexamic acid (CYKLOKAPRON) bolus via infusion - over 30 minutes 1,156.5 mg  15 mg/kg Intravenous To OR Rexene Alberts, MD      . Derrill Memo ON 15-May-2019] tranexamic acid (CYKLOKAPRON) pump prime solution 154 mg  2 mg/kg Intracatheter To OR Rexene Alberts, MD      . Derrill Memo ON May 15, 2019] vancomycin (VANCOCIN) 1,000 mg in sodium chloride 0.9 % 1,000 mL irrigation   Irrigation To OR Rexene Alberts, MD      . Derrill Memo ON May 15, 2019] vancomycin (VANCOREADY) IVPB 1250 mg/250 mL  1,250 mg Intravenous To OR Rexene Alberts, MD          Physical Exam:   BP 133/73 (BP Location: Left Arm, Patient Position: Sitting, Cuff Size: Normal)   Pulse 82   Temp 97.7 F (36.5 C)   Resp 16   Ht 5\' 2"  (1.575 m)   Wt 172 lb (78 kg)   LMP 04/29/1991   SpO2 94% Comment: RA  BMI 31.46 kg/m   General:  Well-appearing  Chest:   Clear to auscultation  CV:   Regular rate and rhythm with systolic murmur  Incisions:  n/a  Abdomen:  Soft nontender  Extremities:  Warm and well-perfused  Diagnostic Tests:  Gated Cardiac CTA  TECHNIQUE: The patient was scanned on a Siemens Force [id number] slice scanner. A 120 kV retrospective scan was triggered in the descending thoracic aorta at 111 HU's. Gantry rotation speed was 270 msecs and collimation was .9 mm. No beta blockade or nitro were given. The 3D data set was reconstructed in 5% intervals of the R-R cycle. Systolic and diastolic phases were analyzed  on a dedicated work station using MPR, MIP and VRT modes. The patient received 80 cc of contrast.  FINDINGS: Aortic Valve: A 21 mm Edwards Magna Ease stented bovine bioprosthetic valve is present. Leaflets are thickened and mildly calcified at base. Planimetry shows AVA 1.5 cm2. 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 There is a 12 mm communication from the LVOT to the pseudo aneurysm beginning below the non coronary cusp. The pseudo aneurysm has increased in size since previous CT 03/14/18 In coronal plane measures 3.7 cm x 2.3 cm and over 4 cm in axial plane. There is a large thrombus burden and non circumferential calcified rim. The pseudo aneurysm extends into the RA and involves both the non and right coronary sinuses. It appears to be pushing the proximal RCA outward. The pseudo aneurysm extends superiorly along the anterior greater curvature of the ascending aortic root approximately 3.5 cm There is clear persistent systolic communication and expansion of the sac.  IMPRESSION: 1. 21 mm Edwards Magna Ease stented bovine bioprosthetic valve with intact sewing ring, thickened leaflets and AVA by planimetry 1.5 cm2  2. 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 Size has increased since CT done 03/14/18 Measures 3.7 cm x 2.3 cm in coronal plane and extends 3.5 cm above annulus  3. Surgical considerations should consider aortic root replacement and protection of the RCA with possible  grafting or re-implantation as the RCA is intimately involved with the aneurysmal sac  Jenkins Rouge   Electronically Signed   By: Jenkins Rouge M.D.   On: 04/29/2019 12:51    CT ANGIOGRAPHY CHEST, ABDOMEN AND PELVIS  TECHNIQUE: Multidetector CT imaging through the chest, abdomen and pelvis was performed using the standard protocol during bolus administration of intravenous contrast. Multiplanar reconstructed images and MIPs were obtained and reviewed to evaluate the vascular anatomy.  CONTRAST:  169mL OMNIPAQUE IOHEXOL 350 MG/ML SOLN  COMPARISON:  03/08/2018 chest CT angiogram. 03/11/2018 MRI abdomen.  FINDINGS: CTA CHEST FINDINGS  Cardiovascular: Mild-to-moderate cardiomegaly. No significant pericardial effusion/thickening. Aortic valve prosthesis is in place. 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 Left anterior descending coronary atherosclerosis. Atherosclerotic nonaneurysmal thoracic aorta. Normal caliber pulmonary arteries. No central pulmonary emboli.  Mediastinum/Nodes: No discrete thyroid nodules. Unremarkable esophagus. No axillary adenopathy. Enlarged 1.4 cm right paratracheal node (series 5/image 32), unchanged. No new pathologically enlarged mediastinal nodes. No pathologically enlarged hilar nodes.  Lungs/Pleura: No pneumothorax. No pleural effusion. No acute consolidative airspace disease, lung masses or significant pulmonary nodules. Stable diffuse bronchial wall thickening. No proximal airway stenoses. Mild osteophyte related fibrosis in the medial right lower lobe. Small parenchymal bands in the mid to lower lungs, compatible with mild scarring or atelectasis.  Musculoskeletal: No aggressive appearing focal osseous lesions. Intact sternotomy wires. Moderate thoracic spondylosis.  There is a 3.2 x 2.2 cm mass in lateral left back posterior to the body of the scapula with peripheral hyperdensity, appearing intramuscular (series 5/image 23), not appreciably changed since 03/08/2018 chest CT.  CTA ABDOMEN AND PELVIS FINDINGS  Hepatobiliary: Normal liver with no liver mass. Normal gallbladder with no radiopaque cholelithiasis. No biliary ductal dilatation.  Pancreas: Normal, with no mass or duct dilation.  Spleen: Normal size. No mass.  Adrenals/Urinary Tract: Heterogeneously hyperdense 2.5 cm left adrenal nodule, stable in size since 03/08/2018 CT. Right adrenal 2.0 cm nodule with density 66 HU, stable. No hydronephrosis. Subcentimeter  hypodense posterior lower left renal cortical lesion is too small to characterize and requires no follow-up. Otherwise no contour deforming renal lesions. Normal bladder.  Stomach/Bowel: Small hiatal hernia. Otherwise normal nondistended stomach. Normal caliber small bowel with no small bowel wall thickening. Normal appendix. Normal large bowel with no diverticulosis, large bowel wall thickening or pericolonic fat stranding.  Vascular/Lymphatic: Atherosclerotic nonaneurysmal abdominal aorta. No pathologically enlarged lymph nodes in the abdomen or pelvis.  Reproductive: Grossly normal uterus. Minimally complex 2.3 cm left adnexal cyst with focus of coarse mural calcification (series 5/image 165). No right adnexal mass.  Other: No pneumoperitoneum, ascites or focal fluid collection.  Musculoskeletal: No aggressive appearing focal osseous lesions. Mild lumbar spondylosis.  VASCULAR MEASUREMENTS PERTINENT TO TAVR:  AORTA:  Minimal Aortic Diameter-14.6 x 13.1 mm  Severity of Aortic Calcification-severe  RIGHT PELVIS:  Right Common Iliac Artery -  Minimal Diameter-9.4 x 4.4 mm  Tortuosity-mild  Calcification-severe  Right External Iliac Artery -  Minimal Diameter-7.3 x 7.1  mm  Tortuosity-mild  Calcification-mild  Right Common Femoral Artery -  Minimal Diameter-7.2 x 5.7 mm  Tortuosity-mild  Calcification-moderate  LEFT PELVIS:  Left Common Iliac Artery -  Minimal Diameter-7.1 x 4.9 mm  Tortuosity-mild  Calcification-severe  Left External Iliac Artery -  Minimal Diameter-7.5 x 6.4 mm  Tortuosity-mild  Calcification-mild  Left Common Femoral Artery -  Minimal Diameter-6.7 x 5.7 mm  Tortuosity-mild  Calcification-moderate  Review of the MIP images confirms the above findings.  IMPRESSION: 1. Vascular findings and measurements pertinent to potential TAVR procedure, as detailed. 2. Aortic valve prosthesis in place. 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 3. Mild-to-moderate cardiomegaly. Coronary atherosclerosis. 4. Stable mild mediastinal lymphadenopathy, nonspecific, probably reactive. 5. Stable indeterminate bilateral adrenal nodules, presumably benign such as due to adenomas. 6. Minimally complex 2.3 cm left adnexal cyst with focus of coarse mural calcification. Suggest initial follow-up pelvic ultrasound in 3 months. 7. Indeterminate 3.2 cm soft tissue mass in the lateral left back posterior to the body of the scapula, stable in size since 03/08/2018 chest CT, presumably benign. 8. Small hiatal hernia. 9.  Aortic Atherosclerosis (ICD10-I70.0).   Electronically Signed   By: Ilona Sorrel M.D.   On: 04/29/2019 11:15   Impression:  Patient has prosthetic valve dysfunction with severe symptomatic aortic stenosis status post aortic valve replacement using a stented bioprosthetic tissue valve in 2013.  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  She recently was hospitalized with acute exacerbation of symptoms of shortness of breath that occurred in the setting of acute blood loss anemia.  Symptoms have stabilized over the past several weeks and the patient's hemoglobin has reportedly increased.  Currently the patient reports stable symptoms of exertional shortness of breath and fatigue consistent with chronic diastolic congestive heart failure, New York Heart Association functional class IIb.  Follow-up EGD revealed that the small gastric ulcer seen on previous EGD has resolved.  I have personally reviewed the patient's recent transthoracic echocardiogram, diagnostic cardiac catheterization and CT angiogram.  Echocardiogram demonstrates significant thickening and restricted leaflet mobility involving all 3 leaflets of the bioprosthetic tissue valve in the aortic position.  Transvalvular gradient across the aortic valve was elevated greater than 4.6 m/s corresponding to mean  transvalvular gradient estimated 50 mmHg.  Left ventricular systolic function remains normal.  I agree the patient needs redo aortic valve replacement.  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  Moreover, surgical intervention will come with added challenges and risks because of the false aneurysm, and I suspect the patient may need root replacement possibly using human allograft in order to definitively treat the enlarging false aneurysm.   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  The natural history of these problems were reviewed, as was her long term prognosis with medical therapy alone.  Surgical options were discussed at length including possible need for human allograft aortic root replacement were discussed.  The patient understands and accepts all potential associated risks of surgery including but not limited to risk of death, stroke, myocardial infarction, congestive heart failure, respiratory failure, renal failure, pneumonia, bleeding requiring blood transfusion and or reexploration, arrhythmia, heart block or bradycardia requiring permanent pacemaker, aortic dissection or other major vascular complication, pleural effusions or other delayed complications related to continued congestive heart failure, and other late complications related to valve replacement including structural valve deterioration and failure, thrombosis, endocarditis, or paravalvular leak.  For OR tomorrow.   I spent in excess of 15 minutes during the conduct of this office consultation and >50% of this time involved direct face-to-face encounter with the patient for counseling and/or coordination of their care.    Valentina Gu. Roxy Manns, MD 05/05/2019 11:57 AM

## 2019-05-05 NOTE — H&P (Signed)
[address]       [name] [address]             [phone]          CARDIOTHORACIC SURGERY HISTORY AND PHYSICAL EXAM  Primary Cardiologist is Kirk Ruths, MD PCP is Asencion Noble, MD  Chief Complaint  Patient presents with  . 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  Patient's cardiac history dates back to 2013 when she underwent elective aortic valve replacement using a 21 mm Edwards magna ease stented bovine pericardial tissue valve for bicuspid aortic valve with severe symptomatic aortic stenosis. The patient's surgical procedure and postoperative recovery was initially uneventful.She was briefly readmitted approximately 1 month postoperatively with atrial fibrillation and a small pulmonary embolism. She was anticoagulated using warfarin and has remained on warfarin ever since. She has been seen regularly on an annual basis by Dr. Stanford Breed, and most recent transthoracic echocardiogram performed February 05, 2017 revealed normal left ventricular systolic function with normal functioning bioprosthetic tissue valve in the aortic position. Peak velocity across the aortic valve and mean transvalvular gradient reported 3.3 m/s and 20 mmHg, respectively, unchanged from echocardiograms performed immediately following her surgery in 2013. 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 This revealed a 2.3 x 3.1 x 2.1 cm "extension of the left ventricular cavity" that in retrospect was felt to be present but smaller on the CT angiogram of the chest performed July 17, 2011 when the patient was diagnosed with acute pulmonary embolism. In follow-up a cardiac gated CT angiogram of the heart was performed March 14, 2018, confirming the presence of what appeared to be a pseudoaneurysm involving the left ventricular outflow tract just beneath the prosthetic valve along the noncoronary sinus of Valsalva. Cardiothoracic surgical consultation was requested and I saw the patient in consultation on April 15, 2018.  At that time the patient was essentially asymptomatic and did not wish to proceed with elective surgical intervention.  Over the past year the patient has developed gradual worsening of exertional shortness of breath.  She was hospitalized from March 30, 2019 to April 01, 2019 with acute exacerbation of shortness of breath.  She was noticed to be anemic with hemoglobin 9.6, down 3 g from her previous baseline.  She was also noted to have Hemoccult positive stool.  Follow-up transthoracic echocardiogram revealed increased gradient across the bioprosthetic tissue valve in the aortic position with severe aortic stenosis.  Peak velocity across aortic valve measured close to 4.5 m/s corresponding to mean transvalvular gradient estimated 50 mmHg and aortic valve area calculated 0.68 cm.  The DVI was reported 0.37 and left ventricular systolic function remain normal.  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  Diagnostic cardiac catheterization was performed and notable for the absence of significant coronary artery disease.  Patient had hyperdynamic cardiac output consistent with  anemia.  Upper GI endoscopy was performed and notable for the presence of a nonbleeding ulcer in the prepyloric region that measured 5 mm in its largest  dimension.  H. pylori testing was subsequently negative.  The patient was placed on twice daily Protonix and has been seen in follow-up in the GI clinic.  Hemoglobin has come up slightly to 10.0 and the patient has been scheduled for follow-up EGD early next week to make sure that her ulcer is healed.  She returns to our office today to further discuss elective redo aortic valve replacement with repair of false aneurysm involving the left ventricular outflow tract.    The patient is retired and lives locally with her husband who is also a previous patient of mine.   Since hospital discharge the patient reports remaining clinically stable.  She still gets short of breath with moderate level activity and this limits her considerably.  She denies resting shortness of breath or orthopnea.  Stools remain dark but she is on oral iron supplementation.She has no exertional chest pain or chest tightness.  She has not had any dizzy spells or syncope.  She denies productive cough, hemoptysis, or wheezing.  She has been practicing social distancing and she has not been exposed to any persons with known or suspected COVID-19 infection.  She has not been taking any oral anticoagulation medication since hospital discharge.  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  The patient was last seen here in our office on April 21, 2019.  She reports no new problems or complaints over the past 2 weeks.   Past Medical History:  Diagnosis Date  . Anemia   . Aortic stenosis     a. echo 1/13: normal EF, severe AS, mean gradient 50 mmHg => s/p AVR 4/13;   b.Echo 07/14/11: Mod LVH, EF 55-60%, grade 2 diast dysfxn, AVR mean gradient 21 mmHg, mod to severe LAE, mild RVE, mod RAE, PASP 32   . Arthritis   . Atrial fibrillation ([facility])    Post operative following AVR  . Bell's palsy 2005  . Cataract   . Dyspnea   . Dysrhythmia    Afib  . 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 GERD (gastroesophageal reflux disease)   . HTN (hypertension), benign   . Hx of cardiac cath    [facility] 4/13:  no CAD, severe AS  . Hypercholesteremia   . Obesity (BMI 30.0-34.9) 06/29/2011  . Prosthetic valve dysfunction   . Pseudoaneurysm of left ventricle of heart   . Pulmonary embolus ([facility])    following AVR  . S/P aortic valve replacement with bioprosthetic valve 07/05/2011   85mm Edwards Magna Ease pericardial tissue valve    Past Surgical History:  Procedure Laterality Date  . AORTIC VALVE REPLACEMENT  07/05/2011   Procedure: AORTIC VALVE REPLACEMENT (AVR);  Surgeon: Rexene Alberts, MD;  Location: [facility];  Service: Open Heart Surgery;  Laterality: N/A;  . CATARACT EXTRACTION     bil  . ESOPHAGOGASTRODUODENOSCOPY (EGD) WITH PROPOFOL N/A 03/31/2019   Procedure: ESOPHAGOGASTRODUODENOSCOPY (EGD) WITH PROPOFOL;  Surgeon: Jerene Bears,  MD;  Location: [facility];  Service: Gastroenterology;  Laterality: N/A;  . EYE SURGERY    . KNEE ARTHROSCOPY  lft  . PARS PLANA VITRECTOMY W/ REPAIR OF MACULAR HOLE    . RIGHT HEART CATH AND CORONARY ANGIOGRAPHY N/A 04/01/2019   Procedure: RIGHT HEART CATH AND CORONARY ANGIOGRAPHY;  Surgeon: Burnell Blanks, MD;  Location: [facility];  Service: Cardiovascular;  Laterality: N/A;    Family History  Problem Relation Age of Onset  . Stroke Father   . Heart attack Mother     Social History Social History   Tobacco Use  . Smoking status: Current Some Day Smoker    Packs/day: 1.00    Years: 45.00    Pack years: 45.00    Types:  Cigarettes    Last attempt to quit: 07/04/2011    Years since quitting: 7.8  . Smokeless tobacco: Never Used  Substance Use Topics  . Alcohol use: Yes    Alcohol/week: 1.0 standard drinks    Types: 1 Standard drinks or equivalent per week    Comment: rare  . Drug use: Never    Prior to Admission medications   Medication Sig Start Date End Date Taking? Authorizing Provider  acetaminophen (TYLENOL) 500 MG tablet Take 500 mg by mouth every 6 (six) hours as needed for mild pain.   Yes [provider]  albuterol (PROVENTIL HFA;VENTOLIN HFA) 108 (90 Base) MCG/ACT inhaler Inhale 2 puffs into the lungs every 4 (four) hours as needed for wheezing or shortness of breath.   Yes [provider]  amoxicillin (AMOXIL) 500 MG capsule Take 4 tablets = 2 grams 30-60 minutes before dental procedures Patient taking differently: Take 2,000 mg by mouth See admin instructions. Take 4 capsules (2000 mg) by mouth 1 hour prior to dental procedures 04/10/19  Yes Crenshaw, Denice Bors, MD  carboxymethylcellulose (LUBRICANT EYE DROPS) 0.5 % SOLN Place 1 drop into both eyes 3 (three) times daily as needed (dry/irritated eyes.).   Yes [provider]  dextromethorphan (DELSYM) 30 MG/5ML liquid Take 30 mg by mouth 2 (two) times daily as needed for cough.    Yes [provider]  ferrous sulfate 325 (65 FE) MG tablet Take 1 tablet (325 mg total) by mouth 2 (two) times daily with a meal. 04/07/19 06/06/19 Yes Domenic Polite, MD  metoprolol succinate (TOPROL-XL) 25 MG 24 hr tablet Take 1 tablet (25 mg total) by mouth every 12 (twelve) hours. 04/10/19  Yes Lelon Perla, MD  pantoprazole (PROTONIX) 40 MG tablet Take one tablet twice daily for 8 weeks. Patient taking differently: Take 40 mg by mouth daily.  04/17/19  Yes Esterwood, Amy S, PA-C  SODIUM FLUORIDE 5000 PPM 1.1 % PSTE Place 1 application onto teeth 2 (two) times daily.  03/10/19  Yes [provider]    Allergies  Allergen  Reactions  . Nickel Rash and Other (See Comments)    BLISTERS  . Sulfa Antibiotics Rash and Other (See Comments)    BLISTERS   . Tape Other (See Comments)    Slight skin irritation     Review of Systems:              General:                      normal appetite, decreased energy, no weight gain, no weight loss, no fever             Cardiac:  no chest pain with exertion, no chest pain at rest, +SOB with exertion, no resting SOB, no PND, no orthopnea, no palpitations, no arrhythmia, no atrial fibrillation, no LE edema, no dizzy spells, no syncope             Respiratory:                 + shortness of breath, no home oxygen, no productive cough, no dry cough, no bronchitis, no wheezing, no hemoptysis, no asthma, no pain with inspiration or cough, no sleep apnea, no CPAP at night             GI:                               no difficulty swallowing, + reflux, no frequent heartburn, no hiatal hernia, no abdominal pain, no constipation, no diarrhea, no hematochezia, no hematemesis, no melena             GU:                              no dysuria,  no frequency, no urinary tract infection, no hematuria, no kidney stones, no kidney disease             Vascular:                     no pain suggestive of claudication, no pain in feet, no leg cramps, no varicose veins, no DVT, no non-healing foot ulcer             Neuro:                         no stroke, no TIA's, no seizures, no headaches, no temporary blindness one eye,  no slurred speech, no peripheral neuropathy, no chronic pain, no instability of gait, no memory/cognitive dysfunction             Musculoskeletal:         + arthritis, no joint swelling, no myalgias, no difficulty walking, normal mobility              Skin:                            no rash, no itching, no skin infections, no pressure sores or ulcerations             Psych:                         no anxiety, no depression, no nervousness, no unusual recent  stress             Eyes:                           no blurry vision, no floaters, no recent vision changes, does not wears glasses or contacts             ENT:                            no hearing loss, no loose or painful teeth, no dentures             Hematologic:               +  easy bruising, no abnormal bleeding, no clotting disorder, no frequent epistaxis             Endocrine:                   no diabetes, does not check CBG's at home                           Physical Exam:              BP (!) 148/67 (BP Location: Left Arm, Patient Position: Sitting, Cuff Size: Normal)   Pulse 85   Temp 97.7 F (36.5 C)   Resp 16   Ht 5\' 2"  (1.575 m)   Wt 172 lb (78 kg)   LMP 04/29/1991   SpO2 92% Comment: RA  BMI 31.46 kg/m              General:                      Mildly obese,  well-appearing             HEENT:                       Unremarkable              Neck:                           no JVD, no bruits, no adenopathy              Chest:                          clear to auscultation, symmetrical breath sounds, no wheezes, no rhonchi              CV:                              RRR, grade III/VI systolic murmur              Abdomen:                    soft, non-tender, no masses              Extremities:                 warm, well-perfused, pulses diminished but palpable, no LE edema             Rectal/GU                   Deferred             Neuro:                         Grossly non-focal and symmetrical throughout             Skin:                            Clean and dry, no rashes, no breakdown   Diagnostic Tests:  ECHOCARDIOGRAM REPORT       Patient Name:  Terri Edwards Date of Exam: 03/30/2019  Medical Rec #: [id number]     Height:    62.0 in  Accession #:  [id number]     Weight:    159.0 lb  Date of Birth: November 16, 1950     BSA:     1.73 m  Patient Age:  65 years      BP:      114/52 mmHg  Patient Gender: F          HR:      74 bpm.  Exam Location: Inpatient   Procedure: 2D Echo   Indications:  cardiomegaly 429.3    History:    Patient has prior history of Echocardiogram examinations,  most         recent 02/05/2017. Signs/Symptoms:Dyspnea; Risk         Factors:Hypertension, Current Smoker and Sleep Apnea.  Aortic         Valve: A    Sonographer:  Johny Chess  Referring Phys: [id number] RONDELL A SMITH   IMPRESSIONS    1. Left ventricular ejection fraction, by visual estimation, is 60 to  65%. The left ventricle has normal function. There is mildly increased  left ventricular hypertrophy.  2. Left ventricular diastolic parameters are consistent with Grade II  diastolic dysfunction (pseudonormalization).  3. Elevated left atrial pressure.  4. Global right ventricle has normal systolic function.The right  ventricular size is normal.  5. Left atrial size was severely dilated.  6. Right atrial size was mildly dilated.  7. Moderate mitral annular calcification.  8. The mitral valve is abnormal. Mild mitral valve regurgitation.  9. The tricuspid valve is normal in structure. Tricuspid valve  regurgitation is mild.  10. The pulmonic valve was not well visualized. Pulmonic valve  regurgitation is not visualized.  11. The tricuspid regurgitant velocity is 3.07 m/s, and with an assumed  right atrial pressure of 15 mmHg, the estimated right ventricular systolic  pressure is moderately elevated at 52.7 mmHg.  12. The inferior vena cava is dilated in size with <50% respiratory  variability, suggesting right atrial pressure of 15 mmHg.  13. 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  Consider gated CT to evaluate for stability  14. S/p Bioprosthetic aortic valve replacement. No regurgitation seen.  Severe bioprosthetic stenosis (Vmax 4.9 m/s, MG 60 mmHg, EOA 0.5 cm^2, DVI  0.27, AT 148 ms). Compared to prior  TTE on 02/05/17, there is a marked  increase in mean gradients (12mmHg to  68mmHg).   FINDINGS  Left Ventricle: Left ventricular ejection fraction, by visual estimation,  is 60 to 65%. The left ventricle has normal function. The left ventricle  has no regional wall motion abnormalities. There is mildly increased left  ventricular hypertrophy. Left  ventricular diastolic parameters are consistent with Grade II diastolic  dysfunction (pseudonormalization). Elevated left atrial pressure.   Right Ventricle: The right ventricular size is normal. No increase in  right ventricular wall thickness. Global RV systolic function is has  normal systolic function. The tricuspid regurgitant velocity is 3.07 m/s,  and with an assumed right atrial pressure  of 15 mmHg, the estimated right ventricular systolic pressure is  moderately elevated at 52.7 mmHg.   Left Atrium: Left atrial size was severely dilated.   Right Atrium: Right atrial size was mildly dilated   Pericardium: Trivial pericardial effusion is present.   Mitral Valve: The mitral valve is abnormal. There is mild thickening of  the mitral valve leaflet(s). Moderate mitral annular calcification. Mild  mitral valve regurgitation.   Tricuspid Valve: The tricuspid valve is normal in structure. Tricuspid  valve  regurgitation is mild.   Aortic Valve: The aortic valve has been repaired/replaced. Aortic valve  regurgitation is not visualized. Aortic valve mean gradient measures 50.0  mmHg. Aortic valve peak gradient measures 79.9 mmHg. Aortic valve area, by  VTI measures 0.65 cm.  Bioprosthetic aortic valve replacement. No regurgitation seen. Severe  bioprosthetic stenosis (Vmax 4.9 m/s, MG 60 mmHg, EOA 0.7 cm^2, DVI 0.27).   Pulmonic Valve: The pulmonic valve was not well visualized. Pulmonic valve  regurgitation is not visualized. Pulmonic regurgitation is not visualized.   Aorta: The aortic root is normal in size and structure.    Venous: The inferior vena cava is dilated in size with less than 50%  respiratory variability, suggesting right atrial pressure of 15 mmHg.   IAS/Shunts: The atrial septum is grossly normal.     LEFT VENTRICLE  PLAX 2D  LVIDd:     5.10 cm Diastology  LVIDs:     3.60 cm LV e' lateral:  8.81 cm/s  LV PW:     1.00 cm LV E/e' lateral: 15.3  LV IVS:    0.90 cm LV e' medial:  7.94 cm/s  LVOT diam:   1.50 cm LV E/e' medial: 17.0  LV SV:     69 ml  LV SV Index:  38.54  LVOT Area:   1.77 cm     RIGHT VENTRICLE  RV S prime:   10.30 cm/s  TAPSE (M-mode): 1.7 cm   LEFT ATRIUM       Index    RIGHT ATRIUM      Index  LA diam:    5.10 cm 2.94 cm/m RA Area:   19.40 cm  LA Vol (A2C):  97.1 ml 55.99 ml/m RA Volume:  52.10 ml 30.04 ml/m  LA Vol (A4C):  101.0 ml 58.24 ml/m  LA Biplane Vol: 101.0 ml 58.24 ml/m  AORTIC VALVE  AV Area (Vmax):  0.68 cm  AV Area (Vmean):  0.63 cm  AV Area (VTI):   0.65 cm  AV Vmax:      447.00 cm/s  AV Vmean:     334.000 cm/s  AV VTI:      1.150 m  AV Peak Grad:   79.9 mmHg  AV Mean Grad:   50.0 mmHg  LVOT Vmax:     172.00 cm/s  LVOT Vmean:    119.750 cm/s  LVOT VTI:     0.424 m  LVOT/AV VTI ratio: 0.37    AORTA  Ao Root diam: 2.70 cm   MITRAL VALVE             TRICUSPID VALVE  MV Area (PHT): 4.39 cm       TR Peak grad:  37.7 mmHg  MV PHT:    50.17 msec      TR Vmax:    307.00 cm/s  MV Decel Time: 173 msec  MV E velocity: 135.00 cm/s 103 cm/s SHUNTS  MV A velocity: 78.50 cm/s 70.3 cm/s Systemic VTI: 0.42 m  MV E/A ratio: 1.72    1.5    Systemic Diam: 1.50 cm     Oswaldo Milian MD  Electronically signed by Oswaldo Milian MD  Signature Date/Time: 03/30/2019/10:41:40 PM      RIGHT HEART CATH AND CORONARY ANGIOGRAPHY     Conclusion 1. No angiographic evidence of CAD   Surgical consultation for discussion regarding redo AVR and repair of LVOT false aneurysm.      Recommendations Antiplatelet/Anticoag Surgical consultation for discussion regarding redo AVR and  repair of LVOT false aneurysm.     Surgeon Notes   03/31/2019 2:44 PM GI Operative Note signed by Jerene Bears, MD     Indications Aortic valve disorder [I35.9 (ICD-10-CM)]     Procedural Details Technical Details Indication: 69 yo female with history of AS s/p surgical AVR in 2013 with bioprosthetic AV now with severe stenosis of the valve with known pseudoaneurysm of the LVOT.   Procedure: The risks, benefits, complications, treatment options, and expected outcomes were discussed with the patient. The patient and/or family concurred with the proposed plan, giving informed consent. The patient was brought to the cath lab after IV hydration was given. The patient was sedated with Versed and Fentanyl. The IV catheter present in the right antecubital vein was changed for a 5 [place] sheath. Right heart catheterization performed with a balloon tipped catheter. The right wrist was prepped and draped in a sterile fashion. 1% lidocaine was used for local anesthesia. Using the modified Seldinger access technique, a 5 French sheath was placed in the right radial artery. 3 mg Verapamil was given through the sheath. 3500 units IV heparin was given. Standard diagnostic catheters were used to perform selective coronary angiography. I did not attempt to cross the aortic valve. The sheath was removed from the right radial artery and a Terumo hemostasis band was applied at the arteriotomy site on the right wrist.    Estimated blood loss <50 mL.   During this procedure medications were administered to achieve and maintain moderate conscious sedation while the patient's heart rate, blood pressure, and oxygen saturation were continuously monitored and I was present face-to-face 100% of this time.         Contrast Medication Name Total Dose  iohexol (OMNIPAQUE) 350 MG/ML injection 80 mL     Radiation/Fluoro Fluoro time: 7.3 (min)  DAP: 12623 (mGycm2)  Cumulative Air Kerma: 123456 (mGy)     Complications    Complications documented before study signed (04/01/2019 9:09 AM)  RIGHT HEART CATH AND CORONARY ANGIOGRAPHY  None Documented by Burnell Blanks, MD 04/01/2019 8:52 AM  Date Found: 04/01/2019  Time Range: Intraprocedure       Coronary Findings Diagnostic Dominance: Right  Left Anterior Descending  Vessel is large.   Ramus Intermedius  Vessel is large.   Left Circumflex  Vessel is large.   Right Coronary Artery  Vessel is large.  Intervention No interventions have been documented.                            Coronary Diagrams Diagnostic Dominance: Right  &&&&&  Intervention      Implants  No implant documentation for this case.      Syng[employer] Images Link to Procedure Log  Show images for CARDIAC CATHETERIZATION Procedure Log     Images on Long Term Storage   Show images for Kendy, Pat[place]         Hemo Data   Most Recent Value  Fick Cardiac Output 8.15 L/min  Fick Cardiac Output Index 4.55 (L/min)/BSA  RA A Wave 5 mmHg  RA V Wave 4 mmHg  RA Mean 2 mmHg  RV Systolic Pressure 41 mmHg  RV Diastolic Pressure 0 mmHg  RV EDP 4 mmHg  PA Systolic Pressure 40 mmHg  PA Diastolic Pressure 10 mmHg  PA Mean 24 mmHg  PW A Wave 12 mmHg  PW V Wave 20 mmHg  PW Mean 11 mmHg  AO  Systolic Pressure A999333 mmHg  AO Diastolic Pressure 43 mmHg  AO Mean 67 mmHg  QP/QS 1  TPVR Index 5.28 HRUI  TSVR Index 14.74 HRUI  PVR SVR Ratio 0.2  TPVR/TSVR Ratio 0.36      Gated Cardiac CTA  TECHNIQUE: The patient was scanned on a Siemens Force [id number] slice scanner. A 120 kV retrospective scan was triggered in the descending thoracic aorta at 111 HU's. Gantry rotation speed was 270 msecs and collimation  was .9 mm. No beta blockade or nitro were given. The 3D data set was reconstructed in 5% intervals of the R-R cycle. Systolic and diastolic phases were analyzed on a dedicated work station using MPR, MIP and VRT modes. The patient received 80 cc of contrast.  FINDINGS: Aortic Valve: A 21 mm Edwards Magna Ease stented bovine bioprosthetic valve is present. Leaflets are thickened and mildly calcified at base. Planimetry shows AVA 1.5 cm2. 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 There is a 12 mm communication from the LVOT to the pseudo aneurysm beginning below the non coronary cusp. The pseudo aneurysm has increased in size since previous CT 03/14/18 In coronal plane measures 3.7 cm x 2.3 cm and over 4 cm in axial plane. There is a large thrombus burden and non circumferential calcified rim. The pseudo aneurysm extends into the RA and involves both the non and right coronary sinuses. It appears to be pushing the proximal RCA outward. The pseudo aneurysm extends superiorly along the anterior greater curvature of the ascending aortic root approximately 3.5 cm There is clear persistent systolic communication and expansion of the sac.  IMPRESSION: 1. 21 mm Edwards Magna Ease stented bovine bioprosthetic valve with intact sewing ring, thickened leaflets and AVA by planimetry 1.5 cm2  2. 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 Size has increased since CT done 03/14/18 Measures 3.7 cm x 2.3  cm in coronal plane and extends 3.5 cm above annulus  3. Surgical considerations should consider aortic root replacement and protection of the RCA with possible grafting or re-implantation as the RCA is intimately involved with the aneurysmal sac  Jenkins Rouge   Electronically Signed By: Jenkins Rouge M.D. On: 04/29/2019 12:51    CT ANGIOGRAPHY CHEST, ABDOMEN AND PELVIS  TECHNIQUE: Multidetector CT imaging through the chest, abdomen and pelvis was performed using the standard protocol during bolus administration of intravenous contrast. Multiplanar reconstructed images and MIPs were obtained and reviewed to evaluate the vascular anatomy.  CONTRAST: 161mL OMNIPAQUE IOHEXOL 350 MG/ML SOLN  COMPARISON: 03/08/2018 chest CT angiogram. 03/11/2018 MRI abdomen.  FINDINGS: CTA CHEST FINDINGS  Cardiovascular: Mild-to-moderate cardiomegaly. No significant pericardial effusion/thickening. Aortic valve prosthesis is in place. 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 Left anterior descending coronary atherosclerosis. Atherosclerotic nonaneurysmal thoracic aorta. Normal caliber pulmonary arteries. No central pulmonary emboli.  Mediastinum/Nodes: No discrete thyroid nodules. Unremarkable esophagus. No axillary adenopathy. Enlarged 1.4 cm right  paratracheal node (series 5/image 32), unchanged. No new pathologically enlarged mediastinal nodes. No pathologically enlarged hilar nodes.  Lungs/Pleura: No pneumothorax. No pleural effusion. No acute consolidative airspace disease, lung masses or significant pulmonary nodules. Stable diffuse bronchial wall thickening. No proximal airway stenoses. Mild osteophyte related fibrosis in the medial right lower lobe. Small parenchymal bands in the mid to lower  lungs, compatible with mild scarring or atelectasis.  Musculoskeletal: No aggressive appearing focal osseous lesions. Intact sternotomy wires. Moderate thoracic spondylosis. There is a 3.2 x 2.2 cm mass in lateral left back posterior to the body of the scapula with peripheral hyperdensity, appearing intramuscular (series 5/image 23), not appreciably changed since 03/08/2018 chest CT.  CTA ABDOMEN AND PELVIS FINDINGS  Hepatobiliary: Normal liver with no liver mass. Normal gallbladder with no radiopaque cholelithiasis. No biliary ductal dilatation.  Pancreas: Normal, with no mass or duct dilation.  Spleen: Normal size. No mass.  Adrenals/Urinary Tract: Heterogeneously hyperdense 2.5 cm left adrenal nodule, stable in size since 03/08/2018 CT. Right adrenal 2.0 cm nodule with density 66 HU, stable. No hydronephrosis. Subcentimeter hypodense posterior lower left renal cortical lesion is too small to characterize and requires no follow-up. Otherwise no contour deforming renal lesions. Normal bladder.  Stomach/Bowel: Small hiatal hernia. Otherwise normal nondistended stomach. Normal caliber small bowel with no small bowel wall thickening. Normal appendix. Normal large bowel with no diverticulosis, large bowel wall thickening or pericolonic fat stranding.  Vascular/Lymphatic: Atherosclerotic nonaneurysmal abdominal aorta. No pathologically enlarged lymph nodes in the abdomen or pelvis.  Reproductive: Grossly normal uterus. Minimally complex 2.3 cm left adnexal cyst with focus of coarse mural calcification (series 5/image 165). No right adnexal mass.  Other: No pneumoperitoneum, ascites or focal fluid collection.  Musculoskeletal: No aggressive appearing focal osseous lesions. Mild lumbar spondylosis.  VASCULAR MEASUREMENTS PERTINENT TO TAVR:  AORTA:  Minimal Aortic Diameter-14.6 x 13.1 mm  Severity of Aortic Calcification-severe  RIGHT PELVIS:  Right  Common Iliac Artery -  Minimal Diameter-9.4 x 4.4 mm  Tortuosity-mild  Calcification-severe  Right External Iliac Artery -  Minimal Diameter-7.3 x 7.1 mm  Tortuosity-mild  Calcification-mild  Right Common Femoral Artery -  Minimal Diameter-7.2 x 5.7 mm  Tortuosity-mild  Calcification-moderate  LEFT PELVIS:  Left Common Iliac Artery -  Minimal Diameter-7.1 x 4.9 mm  Tortuosity-mild  Calcification-severe  Left External Iliac Artery -  Minimal Diameter-7.5 x 6.4 mm  Tortuosity-mild  Calcification-mild  Left Common Femoral Artery -  Minimal Diameter-6.7 x 5.7 mm  Tortuosity-mild  Calcification-moderate  Review of the MIP images confirms the above findings.  IMPRESSION: 1. Vascular findings and measurements pertinent to potential TAVR procedure, as detailed. 2. Aortic valve prosthesis in place. 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 3. Mild-to-moderate cardiomegaly. Coronary atherosclerosis. 4. Stable mild mediastinal lymphadenopathy, nonspecific, probably reactive. 5. Stable indeterminate bilateral adrenal nodules, presumably benign such as due to adenomas. 6. Minimally complex 2.3 cm left adnexal cyst with focus of coarse mural calcification. Suggest initial follow-up pelvic ultrasound in 3 months. 7. Indeterminate 3.2 cm soft tissue mass in the lateral left back posterior to the body of the scapula, stable in size since 03/08/2018 chest CT, presumably benign. 8. Small hiatal hernia. 9. Aortic Atherosclerosis (ICD10-I70.0).   Electronically Signed By: Ilona Sorrel M.D. On: 04/29/2019 11:15   Impression:  Patient has prosthetic valve dysfunction with severe symptomatic aortic stenosis status post aortic valve  replacement using a stented  bioprosthetic tissue valve in 2013. 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 She recently was hospitalized with acute exacerbation of symptoms of shortness of breath that occurred in the setting of acute blood loss anemia. Symptoms have stabilized over the past several weeks and the patient's hemoglobin has reportedly increased. Currently the patient reports stable symptoms of exertional shortness of breath and fatigue consistent with chronic diastolic congestive heart failure, New York Heart Association functional class IIb. Follow-up EGD revealed that the small gastric ulcer seen on previous EGD has resolved.  I have personally reviewed the patient's recent transthoracic echocardiogram, diagnostic cardiac catheterization and CT angiogram. Echocardiogram demonstrates significant thickening and restricted leaflet mobility involving all 3 leaflets of the bioprosthetic tissue valve in the aortic position. Transvalvular gradient across the aortic valve was elevated greater than 4.6 m/s corresponding to mean transvalvular gradient estimated 50 mmHg. Left ventricular systolic function remains normal. I agree the patient needs redo aortic valve replacement. 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 Moreover, surgical intervention will come with added challenges and risks because of the false aneurysm, and I suspect the patient may need root replacement possibly using human allograft in order to definitively treat the enlarging false aneurysm.   Plan:  The patientand her husband wereagain counseled at length regarding treatment alternatives for management of  prosthetic valve dysfunction withsevere aortic stenosisand false aneurysm of the LV outflow tractincluding continued medical therapy versus proceeding with aortic valve replacement in the near future. The natural history of these problems werereviewed, as was herlong term prognosis with medical therapy alone. Surgical options were discussed at length including possible need for human allograft aortic root replacement were discussed. The patient understands and accepts all potential associated risks of surgery including but not limited to risk of death, stroke, myocardial infarction, congestive heart failure, respiratory failure, renal failure, pneumonia, bleeding requiring blood transfusion and or reexploration, arrhythmia, heart block or bradycardia requiring permanent pacemaker, aortic dissection or other major vascular complication, pleural effusions or other delayed complications related to continued congestive heart failure, and other late complications related to valve replacement including structural valve deterioration and failure, thrombosis, endocarditis, or paravalvular leak.  For OR tomorrow.      Valentina Gu. Roxy Manns, MD 05/05/2019 11:57 AM

## 2019-05-05 NOTE — Patient Instructions (Signed)
   Continue taking all current medications without change through the day before surgery.  Make sure to bring all of your medications with you when you come for your Pre-Admission Testing appointment at [facility] Short-Stay Department.  Have nothing to eat or drink after midnight the night before surgery.  On the morning of surgery take only Protonix with a sip of water.

## 2019-05-05 NOTE — Anesthesia Preprocedure Evaluation (Signed)
Anesthesia Evaluation    Reviewed: Allergy & Precautions, Patient's Chart, lab work & pertinent test results  History of Anesthesia Complications Negative for: history of anesthetic complications  Airway Mallampati: III  TM Distance: >3 FB Neck ROM: Full    Dental  (+) Dental Advisory Given, Teeth Intact   Pulmonary shortness of breath and with exertion, sleep apnea , Current Smoker, former smoker, PE   Pulmonary exam normal        Cardiovascular hypertension, Pt. on home beta blockers and Pt. on medications + dysrhythmias Atrial Fibrillation + Valvular Problems/Murmurs (s/p AVR, now with severe bioprosthetic stenosis) AS  Rhythm:Regular Rate:Normal + Systolic murmurs IMPRESSION: 1. 21 mm Edwards Magna Ease stented bovine bioprosthetic valve with intact sewing ring, thickened leaflets and AVA by planimetry 1.5 cm2  2. 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 Size has increased since CT done 03/14/18 Measures 3.7 cm x 2.3 cm in coronal plane and extends 3.5 cm above annulus  3. Surgical considerations should consider aortic root replacement and protection of the RCA with possible grafting or re-implantation as the RCA is intimately involved with the aneurysmal sac  Jenkins Rouge  '21 TTE - EF 60 to 65%. Mildly increased LVH. Grade II diastolic dysfunction (pseudonormalization). Elevated left atrial pressure. LA was severely dilated. RA was mildly dilated. Mild MR and TR. The tricuspid regurgitant velocity is 3.07 m/s, and with an assumed right atrial pressure of 15 mmHg, the estimated right ventricular systolic pressure is moderately elevated at 52.7 mmHg. 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 S/p Bioprosthetic aortic valve replacement. Severe bioprosthetic stenosis (Vmax 4.9 m/s, MG 60 mmHg, EOA 0.5 cm^2, DVI  0.27, AT 148 ms).     Neuro/Psych negative neurological ROS  negative psych ROS   GI/Hepatic Neg liver ROS, GERD  Medicated and Controlled,  Endo/Other   Obesity   Renal/GU negative Renal ROS     Musculoskeletal  (+) Arthritis ,   Abdominal   Peds  Hematology  (+) anemia ,   Anesthesia Other Findings   Reproductive/Obstetrics                             Anesthesia Physical  Anesthesia Plan  ASA: IV  Anesthesia Plan: General   Post-op Pain Management:    Induction: Intravenous  PONV Risk Score and Plan: 4 or greater and Treatment may vary due to age or medical condition, Promethazine, Ondansetron and Dexamethasone  Airway Management Planned: Oral ETT  Additional Equipment: Arterial line, PA Cath, 3D TEE and Ultrasound Guidance Line Placement  Intra-op Plan:   Post-operative Plan: Post-operative intubation/ventilation  Informed Consent:   Plan Discussed with: Anesthesiologist  Anesthesia Plan Comments:         Anesthesia Quick Evaluation

## 2019-05-06 ENCOUNTER — Inpatient Hospital Stay (HOSPITAL_COMMUNITY)
Admission: RE | Admit: 2019-05-06 | Discharge: 2019-05-19 | DRG: 220 | Disposition: E | Payer: Medicare Other | Attending: Thoracic Surgery (Cardiothoracic Vascular Surgery) | Admitting: Thoracic Surgery (Cardiothoracic Vascular Surgery)

## 2019-05-06 ENCOUNTER — Inpatient Hospital Stay (HOSPITAL_COMMUNITY)
Admission: RE | Disposition: E | Payer: Self-pay | Source: Home / Self Care | Attending: Thoracic Surgery (Cardiothoracic Vascular Surgery)

## 2019-05-06 ENCOUNTER — Inpatient Hospital Stay (HOSPITAL_COMMUNITY): Payer: Medicare Other

## 2019-05-06 ENCOUNTER — Encounter (HOSPITAL_COMMUNITY)
Admission: RE | Disposition: E | Payer: Self-pay | Source: Home / Self Care | Attending: Thoracic Surgery (Cardiothoracic Vascular Surgery)

## 2019-05-06 ENCOUNTER — Encounter (HOSPITAL_COMMUNITY): Payer: Self-pay | Admitting: Certified Registered"

## 2019-05-06 ENCOUNTER — Encounter (HOSPITAL_COMMUNITY): Payer: Self-pay | Admitting: Thoracic Surgery (Cardiothoracic Vascular Surgery)

## 2019-05-06 ENCOUNTER — Inpatient Hospital Stay (HOSPITAL_COMMUNITY): Payer: Medicare Other | Admitting: Vascular Surgery

## 2019-05-06 DIAGNOSIS — I083 Combined rheumatic disorders of mitral, aortic and tricuspid valves: Secondary | ICD-10-CM | POA: Diagnosis present

## 2019-05-06 DIAGNOSIS — G4730 Sleep apnea, unspecified: Secondary | ICD-10-CM | POA: Diagnosis present

## 2019-05-06 DIAGNOSIS — Y712 Prosthetic and other implants, materials and accessory cardiovascular devices associated with adverse incidents: Secondary | ICD-10-CM | POA: Diagnosis present

## 2019-05-06 DIAGNOSIS — Y838 Other surgical procedures as the cause of abnormal reaction of the patient, or of later complication, without mention of misadventure at the time of the procedure: Secondary | ICD-10-CM | POA: Diagnosis not present

## 2019-05-06 DIAGNOSIS — I472 Ventricular tachycardia: Secondary | ICD-10-CM | POA: Diagnosis not present

## 2019-05-06 DIAGNOSIS — E785 Hyperlipidemia, unspecified: Secondary | ICD-10-CM | POA: Diagnosis present

## 2019-05-06 DIAGNOSIS — I959 Hypotension, unspecified: Secondary | ICD-10-CM | POA: Diagnosis not present

## 2019-05-06 DIAGNOSIS — D689 Coagulation defect, unspecified: Secondary | ICD-10-CM | POA: Diagnosis present

## 2019-05-06 DIAGNOSIS — D62 Acute posthemorrhagic anemia: Secondary | ICD-10-CM | POA: Diagnosis not present

## 2019-05-06 DIAGNOSIS — F17210 Nicotine dependence, cigarettes, uncomplicated: Secondary | ICD-10-CM | POA: Diagnosis present

## 2019-05-06 DIAGNOSIS — E278 Other specified disorders of adrenal gland: Secondary | ICD-10-CM | POA: Diagnosis present

## 2019-05-06 DIAGNOSIS — I4901 Ventricular fibrillation: Secondary | ICD-10-CM | POA: Diagnosis present

## 2019-05-06 DIAGNOSIS — Z91048 Other nonmedicinal substance allergy status: Secondary | ICD-10-CM

## 2019-05-06 DIAGNOSIS — D696 Thrombocytopenia, unspecified: Secondary | ICD-10-CM | POA: Diagnosis present

## 2019-05-06 DIAGNOSIS — R0602 Shortness of breath: Secondary | ICD-10-CM | POA: Diagnosis present

## 2019-05-06 DIAGNOSIS — I253 Aneurysm of heart: Secondary | ICD-10-CM | POA: Diagnosis present

## 2019-05-06 DIAGNOSIS — Z954 Presence of other heart-valve replacement: Secondary | ICD-10-CM

## 2019-05-06 DIAGNOSIS — M47816 Spondylosis without myelopathy or radiculopathy, lumbar region: Secondary | ICD-10-CM | POA: Diagnosis present

## 2019-05-06 DIAGNOSIS — I5032 Chronic diastolic (congestive) heart failure: Secondary | ICD-10-CM | POA: Diagnosis present

## 2019-05-06 DIAGNOSIS — I350 Nonrheumatic aortic (valve) stenosis: Secondary | ICD-10-CM

## 2019-05-06 DIAGNOSIS — I9751 Accidental puncture and laceration of a circulatory system organ or structure during a circulatory system procedure: Secondary | ICD-10-CM | POA: Diagnosis not present

## 2019-05-06 DIAGNOSIS — Z6831 Body mass index (BMI) 31.0-31.9, adult: Secondary | ICD-10-CM

## 2019-05-06 DIAGNOSIS — T8209XA Other mechanical complication of heart valve prosthesis, initial encounter: Secondary | ICD-10-CM | POA: Diagnosis not present

## 2019-05-06 DIAGNOSIS — Z951 Presence of aortocoronary bypass graft: Secondary | ICD-10-CM

## 2019-05-06 DIAGNOSIS — Y92234 Operating room of hospital as the place of occurrence of the external cause: Secondary | ICD-10-CM | POA: Diagnosis not present

## 2019-05-06 DIAGNOSIS — I10 Essential (primary) hypertension: Secondary | ICD-10-CM | POA: Diagnosis present

## 2019-05-06 DIAGNOSIS — I700 Atherosclerosis of aorta: Secondary | ICD-10-CM | POA: Diagnosis present

## 2019-05-06 DIAGNOSIS — E7800 Pure hypercholesterolemia, unspecified: Secondary | ICD-10-CM | POA: Diagnosis present

## 2019-05-06 DIAGNOSIS — I729 Aneurysm of unspecified site: Secondary | ICD-10-CM | POA: Diagnosis present

## 2019-05-06 DIAGNOSIS — I493 Ventricular premature depolarization: Secondary | ICD-10-CM | POA: Diagnosis not present

## 2019-05-06 DIAGNOSIS — M1990 Unspecified osteoarthritis, unspecified site: Secondary | ICD-10-CM | POA: Diagnosis not present

## 2019-05-06 DIAGNOSIS — K219 Gastro-esophageal reflux disease without esophagitis: Secondary | ICD-10-CM | POA: Diagnosis present

## 2019-05-06 DIAGNOSIS — I2510 Atherosclerotic heart disease of native coronary artery without angina pectoris: Secondary | ICD-10-CM | POA: Diagnosis not present

## 2019-05-06 DIAGNOSIS — E669 Obesity, unspecified: Secondary | ICD-10-CM | POA: Diagnosis present

## 2019-05-06 DIAGNOSIS — K449 Diaphragmatic hernia without obstruction or gangrene: Secondary | ICD-10-CM | POA: Diagnosis present

## 2019-05-06 DIAGNOSIS — I359 Nonrheumatic aortic valve disorder, unspecified: Secondary | ICD-10-CM | POA: Diagnosis present

## 2019-05-06 DIAGNOSIS — I480 Paroxysmal atrial fibrillation: Secondary | ICD-10-CM | POA: Diagnosis present

## 2019-05-06 DIAGNOSIS — I358 Other nonrheumatic aortic valve disorders: Secondary | ICD-10-CM | POA: Diagnosis present

## 2019-05-06 DIAGNOSIS — Z952 Presence of prosthetic heart valve: Secondary | ICD-10-CM

## 2019-05-06 DIAGNOSIS — Z8249 Family history of ischemic heart disease and other diseases of the circulatory system: Secondary | ICD-10-CM

## 2019-05-06 DIAGNOSIS — I110 Hypertensive heart disease with heart failure: Secondary | ICD-10-CM | POA: Diagnosis present

## 2019-05-06 DIAGNOSIS — T82228A Other mechanical complication of biological heart valve graft, initial encounter: Secondary | ICD-10-CM

## 2019-05-06 DIAGNOSIS — Z953 Presence of xenogenic heart valve: Secondary | ICD-10-CM

## 2019-05-06 DIAGNOSIS — Z79899 Other long term (current) drug therapy: Secondary | ICD-10-CM

## 2019-05-06 DIAGNOSIS — I462 Cardiac arrest due to underlying cardiac condition: Secondary | ICD-10-CM | POA: Diagnosis not present

## 2019-05-06 DIAGNOSIS — Z882 Allergy status to sulfonamides status: Secondary | ICD-10-CM

## 2019-05-06 HISTORY — DX: Presence of aortocoronary bypass graft: Z95.1

## 2019-05-06 HISTORY — DX: Presence of other heart-valve replacement: Z95.4

## 2019-05-06 HISTORY — PX: EXPLORATION POST OPERATIVE OPEN HEART: SHX5061

## 2019-05-06 HISTORY — PX: TEE WITHOUT CARDIOVERSION: SHX5443

## 2019-05-06 HISTORY — PX: CORONARY ARTERY BYPASS GRAFT: SHX141

## 2019-05-06 HISTORY — PX: ASCENDING AORTIC ROOT REPLACEMENT: SHX5729

## 2019-05-06 HISTORY — PX: AORTIC VALVE REPLACEMENT: SHX41

## 2019-05-06 HISTORY — PX: THORACIC AORTIC ANEURYSM REPAIR: SHX799

## 2019-05-06 LAB — POCT I-STAT, CHEM 8
BUN: 10 mg/dL (ref 8–23)
BUN: 10 mg/dL (ref 8–23)
BUN: 9 mg/dL (ref 8–23)
BUN: 9 mg/dL (ref 8–23)
BUN: 8 mg/dL (ref 8–23)
BUN: 7 mg/dL — ABNORMAL LOW (ref 8–23)
BUN: 7 mg/dL — ABNORMAL LOW (ref 8–23)
BUN: 7 mg/dL — ABNORMAL LOW (ref 8–23)
Calcium, Ion: 1.19 mmol/L (ref 1.15–1.40)
Calcium, Ion: 1.04 mmol/L — ABNORMAL LOW (ref 1.15–1.40)
Calcium, Ion: 1.16 mmol/L (ref 1.15–1.40)
Calcium, Ion: 1.02 mmol/L — ABNORMAL LOW (ref 1.15–1.40)
Calcium, Ion: 0.96 mmol/L — ABNORMAL LOW (ref 1.15–1.40)
Calcium, Ion: 0.91 mmol/L — ABNORMAL LOW (ref 1.15–1.40)
Calcium, Ion: 0.86 mmol/L — CL (ref 1.15–1.40)
Calcium, Ion: 1.13 mmol/L — ABNORMAL LOW (ref 1.15–1.40)
Chloride: 99 mmol/L (ref 98–111)
Chloride: 101 mmol/L (ref 98–111)
Chloride: 101 mmol/L (ref 98–111)
Chloride: 97 mmol/L — ABNORMAL LOW (ref 98–111)
Chloride: 99 mmol/L (ref 98–111)
Chloride: 99 mmol/L (ref 98–111)
Chloride: 101 mmol/L (ref 98–111)
Chloride: 105 mmol/L (ref 98–111)
Creatinine, Ser: 0.7 mg/dL (ref 0.44–1.00)
Creatinine, Ser: 0.5 mg/dL (ref 0.44–1.00)
Creatinine, Ser: 0.7 mg/dL (ref 0.44–1.00)
Creatinine, Ser: 0.5 mg/dL (ref 0.44–1.00)
Creatinine, Ser: 0.5 mg/dL (ref 0.44–1.00)
Creatinine, Ser: 0.4 mg/dL — ABNORMAL LOW (ref 0.44–1.00)
Creatinine, Ser: 0.4 mg/dL — ABNORMAL LOW (ref 0.44–1.00)
Creatinine, Ser: 0.4 mg/dL — ABNORMAL LOW (ref 0.44–1.00)
Glucose, Bld: 96 mg/dL (ref 70–99)
Glucose, Bld: 107 mg/dL — ABNORMAL HIGH (ref 70–99)
Glucose, Bld: 115 mg/dL — ABNORMAL HIGH (ref 70–99)
Glucose, Bld: 133 mg/dL — ABNORMAL HIGH (ref 70–99)
Glucose, Bld: 120 mg/dL — ABNORMAL HIGH (ref 70–99)
Glucose, Bld: 114 mg/dL — ABNORMAL HIGH (ref 70–99)
Glucose, Bld: 105 mg/dL — ABNORMAL HIGH (ref 70–99)
Glucose, Bld: 113 mg/dL — ABNORMAL HIGH (ref 70–99)
HCT: 38 % (ref 36.0–46.0)
HCT: 31 % — ABNORMAL LOW (ref 36.0–46.0)
HCT: 38 % (ref 36.0–46.0)
HCT: 30 % — ABNORMAL LOW (ref 36.0–46.0)
HCT: 31 % — ABNORMAL LOW (ref 36.0–46.0)
HCT: 28 % — ABNORMAL LOW (ref 36.0–46.0)
HCT: 24 % — ABNORMAL LOW (ref 36.0–46.0)
HCT: 26 % — ABNORMAL LOW (ref 36.0–46.0)
Hemoglobin: 12.9 g/dL (ref 12.0–15.0)
Hemoglobin: 10.5 g/dL — ABNORMAL LOW (ref 12.0–15.0)
Hemoglobin: 12.9 g/dL (ref 12.0–15.0)
Hemoglobin: 10.2 g/dL — ABNORMAL LOW (ref 12.0–15.0)
Hemoglobin: 10.5 g/dL — ABNORMAL LOW (ref 12.0–15.0)
Hemoglobin: 9.5 g/dL — ABNORMAL LOW (ref 12.0–15.0)
Hemoglobin: 8.2 g/dL — ABNORMAL LOW (ref 12.0–15.0)
Hemoglobin: 8.8 g/dL — ABNORMAL LOW (ref 12.0–15.0)
Potassium: 4.2 mmol/L (ref 3.5–5.1)
Potassium: 4.2 mmol/L (ref 3.5–5.1)
Potassium: 5.3 mmol/L — ABNORMAL HIGH (ref 3.5–5.1)
Potassium: 3.9 mmol/L (ref 3.5–5.1)
Potassium: 4.1 mmol/L (ref 3.5–5.1)
Potassium: 3.9 mmol/L (ref 3.5–5.1)
Potassium: 4.7 mmol/L (ref 3.5–5.1)
Potassium: 4.5 mmol/L (ref 3.5–5.1)
Sodium: 139 mmol/L (ref 135–145)
Sodium: 137 mmol/L (ref 135–145)
Sodium: 138 mmol/L (ref 135–145)
Sodium: 136 mmol/L (ref 135–145)
Sodium: 138 mmol/L (ref 135–145)
Sodium: 136 mmol/L (ref 135–145)
Sodium: 139 mmol/L (ref 135–145)
Sodium: 140 mmol/L (ref 135–145)
TCO2: 30 mmol/L (ref 22–32)
TCO2: 29 mmol/L (ref 22–32)
TCO2: 30 mmol/L (ref 22–32)
TCO2: 30 mmol/L (ref 22–32)
TCO2: 30 mmol/L (ref 22–32)
TCO2: 29 mmol/L (ref 22–32)
TCO2: 30 mmol/L (ref 22–32)
TCO2: 26 mmol/L (ref 22–32)

## 2019-05-06 LAB — CBC
HCT: 30.7 % — ABNORMAL LOW (ref 36.0–46.0)
Hemoglobin: 9.6 g/dL — ABNORMAL LOW (ref 12.0–15.0)
MCH: 30.2 pg (ref 26.0–34.0)
MCHC: 31.3 g/dL (ref 30.0–36.0)
MCV: 96.5 fL (ref 80.0–100.0)
Platelets: 81 10*3/uL — ABNORMAL LOW (ref 150–400)
RBC: 3.18 MIL/uL — ABNORMAL LOW (ref 3.87–5.11)
RDW: 18 % — ABNORMAL HIGH (ref 11.5–15.5)
WBC: 13.6 10*3/uL — ABNORMAL HIGH (ref 4.0–10.5)
nRBC: 0 % (ref 0.0–0.2)

## 2019-05-06 LAB — POCT I-STAT 7, (LYTES, BLD GAS, ICA,H+H)
Acid-Base Excess: 4 mmol/L — ABNORMAL HIGH (ref 0.0–2.0)
Acid-Base Excess: 4 mmol/L — ABNORMAL HIGH (ref 0.0–2.0)
Acid-base deficit: 1 mmol/L (ref 0.0–2.0)
Bicarbonate: 28.3 mmol/L — ABNORMAL HIGH (ref 20.0–28.0)
Bicarbonate: 28 mmol/L (ref 20.0–28.0)
Bicarbonate: 24.3 mmol/L (ref 20.0–28.0)
Calcium, Ion: 0.99 mmol/L — ABNORMAL LOW (ref 1.15–1.40)
Calcium, Ion: 0.85 mmol/L — CL (ref 1.15–1.40)
Calcium, Ion: 1.12 mmol/L — ABNORMAL LOW (ref 1.15–1.40)
HCT: 32 % — ABNORMAL LOW (ref 36.0–46.0)
HCT: 23 % — ABNORMAL LOW (ref 36.0–46.0)
HCT: 24 % — ABNORMAL LOW (ref 36.0–46.0)
Hemoglobin: 10.9 g/dL — ABNORMAL LOW (ref 12.0–15.0)
Hemoglobin: 7.8 g/dL — ABNORMAL LOW (ref 12.0–15.0)
Hemoglobin: 8.2 g/dL — ABNORMAL LOW (ref 12.0–15.0)
O2 Saturation: 100 %
O2 Saturation: 100 %
O2 Saturation: 100 %
Potassium: 4.3 mmol/L (ref 3.5–5.1)
Potassium: 4.7 mmol/L (ref 3.5–5.1)
Potassium: 4.5 mmol/L (ref 3.5–5.1)
Sodium: 140 mmol/L (ref 135–145)
Sodium: 139 mmol/L (ref 135–145)
Sodium: 141 mmol/L (ref 135–145)
TCO2: 30 mmol/L (ref 22–32)
TCO2: 29 mmol/L (ref 22–32)
TCO2: 26 mmol/L (ref 22–32)
pCO2 arterial: 41 mmHg (ref 32.0–48.0)
pCO2 arterial: 36.8 mmHg (ref 32.0–48.0)
pCO2 arterial: 42.9 mmHg (ref 32.0–48.0)
pH, Arterial: 7.446 (ref 7.350–7.450)
pH, Arterial: 7.489 — ABNORMAL HIGH (ref 7.350–7.450)
pH, Arterial: 7.361 (ref 7.350–7.450)
pO2, Arterial: 388 mmHg — ABNORMAL HIGH (ref 83.0–108.0)
pO2, Arterial: 367 mmHg — ABNORMAL HIGH (ref 83.0–108.0)
pO2, Arterial: 462 mmHg — ABNORMAL HIGH (ref 83.0–108.0)

## 2019-05-06 LAB — HEMOGLOBIN AND HEMATOCRIT, BLOOD
HCT: 28.8 % — ABNORMAL LOW (ref 36.0–46.0)
Hemoglobin: 8.9 g/dL — ABNORMAL LOW (ref 12.0–15.0)

## 2019-05-06 LAB — FIBRINOGEN: Fibrinogen: 141 mg/dL — ABNORMAL LOW (ref 210–475)

## 2019-05-06 LAB — GLUCOSE, CAPILLARY: Glucose-Capillary: 122 mg/dL — ABNORMAL HIGH (ref 70–99)

## 2019-05-06 LAB — ECHO INTRAOPERATIVE TEE
Height: 62 in
Weight: 2752.01 oz

## 2019-05-06 LAB — PLATELET COUNT: Platelets: 91 10*3/uL — ABNORMAL LOW (ref 150–400)

## 2019-05-06 LAB — PREPARE RBC (CROSSMATCH)

## 2019-05-06 SURGERY — REDO AORTIC VALVE REPLACEMENT (AVR)
Anesthesia: General | Site: Chest

## 2019-05-06 SURGERY — EXPLORATION POST OPERATIVE OPEN HEART
Anesthesia: General | Site: Chest

## 2019-05-06 MED ORDER — ACETAMINOPHEN 160 MG/5ML PO SOLN: 650.0000 mg | Freq: Once | ORAL | Status: DC

## 2019-05-06 MED ORDER — ACETAMINOPHEN 160 MG/5ML PO SOLN: 1000.0000 mg | Freq: Four times a day (QID) | ORAL | Status: DC

## 2019-05-06 MED ORDER — SODIUM CHLORIDE 0.9 % IV SOLN: 250.0000 mL | INTRAVENOUS | Status: DC

## 2019-05-06 MED ORDER — PANTOPRAZOLE SODIUM 40 MG PO TBEC: 40.0000 mg | DELAYED_RELEASE_TABLET | Freq: Every day | ORAL | Status: DC

## 2019-05-06 MED ORDER — CHLORHEXIDINE GLUCONATE 0.12 % MT SOLN
15.0000 mL | Freq: Once | OROMUCOSAL | Status: AC
  Administered 2019-05-06: 15 mL via OROMUCOSAL
  Filled 2019-05-06: qty 15

## 2019-05-06 MED ORDER — SODIUM CHLORIDE 0.9% IV SOLUTION: Freq: Once | INTRAVENOUS | Status: DC

## 2019-05-06 MED ORDER — NITROGLYCERIN IN D5W 200-5 MCG/ML-% IV SOLN: 0.0000 ug/min | INTRAVENOUS | Status: DC

## 2019-05-06 MED ORDER — PROPOFOL 10 MG/ML IV BOLUS
INTRAVENOUS | Status: DC | PRN
  Administered 2019-05-06: 130 mg via INTRAVENOUS

## 2019-05-06 MED ORDER — SODIUM CHLORIDE 0.9 % IV SOLN
750.0000 mg | INTRAVENOUS | Status: DC
  Filled 2019-05-06: qty 750

## 2019-05-06 MED ORDER — TRAMADOL HCL 50 MG PO TABS: 50.0000 mg | ORAL_TABLET | ORAL | Status: DC | PRN

## 2019-05-06 MED ORDER — HEMOSTATIC AGENTS (NO CHARGE) OPTIME
TOPICAL | Status: DC | PRN
  Administered 2019-05-06 (×3): 1 via TOPICAL

## 2019-05-06 MED ORDER — LACTATED RINGERS IV SOLN: 500.0000 mL | Freq: Once | INTRAVENOUS | Status: DC | PRN

## 2019-05-06 MED ORDER — MORPHINE SULFATE (PF) 2 MG/ML IV SOLN: 1.0000 mg | INTRAVENOUS | Status: DC | PRN

## 2019-05-06 MED ORDER — SODIUM CHLORIDE 0.9% FLUSH: 3.0000 mL | INTRAVENOUS | Status: DC | PRN

## 2019-05-06 MED ORDER — FAMOTIDINE IN NACL 20-0.9 MG/50ML-% IV SOLN: 20.0000 mg | Freq: Two times a day (BID) | INTRAVENOUS | Status: DC

## 2019-05-06 MED ORDER — ASPIRIN 81 MG PO CHEW: 324.0000 mg | CHEWABLE_TABLET | Freq: Every day | ORAL | Status: DC

## 2019-05-06 MED ORDER — LIDOCAINE 2% (20 MG/ML) 5 ML SYRINGE
INTRAMUSCULAR | Status: AC
  Filled 2019-05-06: qty 5

## 2019-05-06 MED ORDER — NOREPINEPHRINE 4 MG/250ML-% IV SOLN
0.0000 ug/min | INTRAVENOUS | Status: DC
  Filled 2019-05-06: qty 250

## 2019-05-06 MED ORDER — DOCUSATE SODIUM 100 MG PO CAPS: 200.0000 mg | ORAL_CAPSULE | Freq: Every day | ORAL | Status: DC

## 2019-05-06 MED ORDER — PHENYLEPHRINE HCL-NACL 20-0.9 MG/250ML-% IV SOLN: 0.0000 ug/min | INTRAVENOUS | Status: DC

## 2019-05-06 MED ORDER — BISACODYL 10 MG RE SUPP: 10.0000 mg | Freq: Every day | RECTAL | Status: DC

## 2019-05-06 MED ORDER — SUCCINYLCHOLINE CHLORIDE 200 MG/10ML IV SOSY
PREFILLED_SYRINGE | INTRAVENOUS | Status: AC
  Filled 2019-05-06: qty 10

## 2019-05-06 MED ORDER — CALCIUM CHLORIDE 10 % IV SOLN
INTRAVENOUS | Status: AC
  Filled 2019-05-06: qty 10

## 2019-05-06 MED ORDER — VANCOMYCIN HCL 1000 MG IV SOLR
INTRAVENOUS | Status: DC
  Filled 2019-05-06: qty 1000

## 2019-05-06 MED ORDER — LACTATED RINGERS IV SOLN: INTRAVENOUS | Status: DC | PRN

## 2019-05-06 MED ORDER — OXYCODONE HCL 5 MG PO TABS: 5.0000 mg | ORAL_TABLET | ORAL | Status: DC | PRN

## 2019-05-06 MED ORDER — BISACODYL 5 MG PO TBEC: 10.0000 mg | DELAYED_RELEASE_TABLET | Freq: Every day | ORAL | Status: DC

## 2019-05-06 MED ORDER — DIPHENHYDRAMINE HCL 50 MG/ML IJ SOLN
INTRAMUSCULAR | Status: AC
  Filled 2019-05-06: qty 1

## 2019-05-06 MED ORDER — SODIUM CHLORIDE 0.9 % IV SOLN: INTRAVENOUS | Status: DC | PRN

## 2019-05-06 MED ORDER — MAGNESIUM SULFATE 4 GM/100ML IV SOLN: 4.0000 g | Freq: Once | INTRAVENOUS | Status: DC

## 2019-05-06 MED ORDER — SODIUM CHLORIDE 0.9 % IV SOLN
1.5000 g | Freq: Two times a day (BID) | INTRAVENOUS | Status: DC
  Filled 2019-05-06 (×2): qty 1.5

## 2019-05-06 MED ORDER — MAGNESIUM SULFATE 50 % IJ SOLN
40.0000 meq | INTRAMUSCULAR | Status: DC
  Filled 2019-05-06: qty 9.85

## 2019-05-06 MED ORDER — HEPARIN SODIUM (PORCINE) 1000 UNIT/ML IJ SOLN
INTRAMUSCULAR | Status: DC | PRN
  Administered 2019-05-06: 26000 [IU] via INTRAVENOUS

## 2019-05-06 MED ORDER — SODIUM CHLORIDE 0.9% FLUSH: 3.0000 mL | Freq: Two times a day (BID) | INTRAVENOUS | Status: DC

## 2019-05-06 MED ORDER — MIDAZOLAM HCL (PF) 10 MG/2ML IJ SOLN
INTRAMUSCULAR | Status: AC
  Filled 2019-05-06: qty 2

## 2019-05-06 MED ORDER — SODIUM CHLORIDE 0.9 % IR SOLN
Status: DC | PRN
  Administered 2019-05-06: 6000 mL

## 2019-05-06 MED ORDER — SUCCINYLCHOLINE CHLORIDE 20 MG/ML IJ SOLN
INTRAMUSCULAR | Status: DC | PRN
  Administered 2019-05-06: 180 mg via INTRAVENOUS

## 2019-05-06 MED ORDER — SODIUM CHLORIDE 0.45 % IV SOLN: INTRAVENOUS | Status: DC | PRN

## 2019-05-06 MED ORDER — DEXMEDETOMIDINE HCL IN NACL 200 MCG/50ML IV SOLN
INTRAVENOUS | Status: AC
  Filled 2019-05-06: qty 50

## 2019-05-06 MED ORDER — INSULIN REGULAR(HUMAN) IN NACL 100-0.9 UT/100ML-% IV SOLN
INTRAVENOUS | Status: DC
  Filled 2019-05-06: qty 100

## 2019-05-06 MED ORDER — SODIUM CHLORIDE 0.9 % IV SOLN
1.5000 g | INTRAVENOUS | Status: DC
  Filled 2019-05-06: qty 1.5

## 2019-05-06 MED ORDER — VANCOMYCIN HCL 1250 MG/250ML IV SOLN
1250.0000 mg | INTRAVENOUS | Status: DC
  Filled 2019-05-06 (×2): qty 250

## 2019-05-06 MED ORDER — MIDAZOLAM HCL 2 MG/2ML IJ SOLN: 2.0000 mg | INTRAMUSCULAR | Status: DC | PRN

## 2019-05-06 MED ORDER — ACETAMINOPHEN 650 MG RE SUPP: 650.0000 mg | Freq: Once | RECTAL | Status: DC

## 2019-05-06 MED ORDER — TRANEXAMIC ACID (OHS) BOLUS VIA INFUSION
15.0000 mg/kg | INTRAVENOUS | Status: DC
  Filled 2019-05-06: qty 1170

## 2019-05-06 MED ORDER — FENTANYL CITRATE (PF) 250 MCG/5ML IJ SOLN
INTRAMUSCULAR | Status: AC
  Filled 2019-05-06: qty 25

## 2019-05-06 MED ORDER — EPINEPHRINE HCL 5 MG/250ML IV SOLN IN NS
0.0000 ug/min | INTRAVENOUS | Status: DC
  Filled 2019-05-06 (×2): qty 250

## 2019-05-06 MED ORDER — CHLORHEXIDINE GLUCONATE 4 % EX LIQD: 30.0000 mL | CUTANEOUS | Status: DC

## 2019-05-06 MED ORDER — DEXMEDETOMIDINE HCL IN NACL 400 MCG/100ML IV SOLN: 0.0000 ug/kg/h | INTRAVENOUS | Status: DC

## 2019-05-06 MED ORDER — PROTAMINE SULFATE 10 MG/ML IV SOLN
INTRAVENOUS | Status: DC | PRN
  Administered 2019-05-06: 260 mg via INTRAVENOUS

## 2019-05-06 MED ORDER — ROCURONIUM BROMIDE 10 MG/ML (PF) SYRINGE
PREFILLED_SYRINGE | INTRAVENOUS | Status: DC | PRN
  Administered 2019-05-06: 70 mg via INTRAVENOUS
  Administered 2019-05-06: 50 mg via INTRAVENOUS
  Administered 2019-05-06: 20 mg via INTRAVENOUS
  Administered 2019-05-06: 50 mg via INTRAVENOUS

## 2019-05-06 MED ORDER — TRANEXAMIC ACID 1000 MG/10ML IV SOLN
1.5000 mg/kg/h | INTRAVENOUS | Status: DC
  Filled 2019-05-06: qty 25

## 2019-05-06 MED ORDER — METOPROLOL TARTRATE 12.5 MG HALF TABLET: 12.5000 mg | ORAL_TABLET | Freq: Two times a day (BID) | ORAL | Status: DC

## 2019-05-06 MED ORDER — CHLORHEXIDINE GLUCONATE 0.12 % MT SOLN: 15.0000 mL | OROMUCOSAL | Status: DC

## 2019-05-06 MED ORDER — POTASSIUM CHLORIDE 2 MEQ/ML IV SOLN
80.0000 meq | INTRAVENOUS | Status: DC
  Filled 2019-05-06: qty 40

## 2019-05-06 MED ORDER — NITROGLYCERIN IN D5W 200-5 MCG/ML-% IV SOLN
2.0000 ug/min | INTRAVENOUS | Status: DC
  Filled 2019-05-06: qty 250

## 2019-05-06 MED ORDER — ALBUMIN HUMAN 5 % IV SOLN: INTRAVENOUS | Status: DC | PRN

## 2019-05-06 MED ORDER — METOPROLOL TARTRATE 5 MG/5ML IV SOLN: 2.5000 mg | INTRAVENOUS | Status: DC | PRN

## 2019-05-06 MED ORDER — PROTAMINE SULFATE 10 MG/ML IV SOLN
INTRAVENOUS | Status: AC
  Filled 2019-05-06: qty 25

## 2019-05-06 MED ORDER — LACTATED RINGERS IV SOLN: INTRAVENOUS | Status: DC

## 2019-05-06 MED ORDER — ALBUMIN HUMAN 5 % IV SOLN
250.0000 mL | INTRAVENOUS | Status: DC | PRN
  Filled 2019-05-06 (×2): qty 1000

## 2019-05-06 MED ORDER — PROPOFOL 10 MG/ML IV BOLUS
INTRAVENOUS | Status: AC
  Filled 2019-05-06: qty 40

## 2019-05-06 MED ORDER — ARTIFICIAL TEARS OPHTHALMIC OINT
TOPICAL_OINTMENT | OPHTHALMIC | Status: DC | PRN
  Administered 2019-05-06: 1 via OPHTHALMIC

## 2019-05-06 MED ORDER — CALCIUM CHLORIDE 10 % IV SOLN
INTRAVENOUS | Status: DC | PRN
  Administered 2019-05-06: 250 mg via INTRAVENOUS

## 2019-05-06 MED ORDER — INSULIN REGULAR(HUMAN) IN NACL 100-0.9 UT/100ML-% IV SOLN: INTRAVENOUS | Status: DC

## 2019-05-06 MED ORDER — METOPROLOL TARTRATE 25 MG/10 ML ORAL SUSPENSION: 12.5000 mg | Freq: Two times a day (BID) | ORAL | Status: DC

## 2019-05-06 MED ORDER — TRANEXAMIC ACID (OHS) PUMP PRIME SOLUTION
2.0000 mg/kg | INTRAVENOUS | Status: DC
  Filled 2019-05-06: qty 1.56

## 2019-05-06 MED ORDER — SODIUM CHLORIDE 0.9 % IV SOLN: INTRAVENOUS | Status: DC

## 2019-05-06 MED ORDER — SODIUM CHLORIDE 0.9 % IV SOLN
INTRAVENOUS | Status: DC
  Filled 2019-05-06: qty 30

## 2019-05-06 MED ORDER — DEXTROSE 50 % IV SOLN: 0.0000 mL | INTRAVENOUS | Status: DC | PRN

## 2019-05-06 MED ORDER — DEXMEDETOMIDINE HCL IN NACL 400 MCG/100ML IV SOLN
0.1000 ug/kg/h | INTRAVENOUS | Status: DC
  Filled 2019-05-06: qty 100

## 2019-05-06 MED ORDER — POTASSIUM CHLORIDE 10 MEQ/50ML IV SOLN: 10.0000 meq | INTRAVENOUS | Status: DC

## 2019-05-06 MED ORDER — ASPIRIN EC 325 MG PO TBEC: 325.0000 mg | DELAYED_RELEASE_TABLET | Freq: Every day | ORAL | Status: DC

## 2019-05-06 MED ORDER — ONDANSETRON HCL 4 MG/2ML IJ SOLN: 4.0000 mg | Freq: Four times a day (QID) | INTRAMUSCULAR | Status: DC | PRN

## 2019-05-06 MED ORDER — ACETAMINOPHEN 500 MG PO TABS: 1000.0000 mg | ORAL_TABLET | Freq: Four times a day (QID) | ORAL | Status: DC

## 2019-05-06 MED ORDER — MIDAZOLAM HCL 5 MG/5ML IJ SOLN
INTRAMUSCULAR | Status: DC | PRN
  Administered 2019-05-06 (×5): 2 mg via INTRAVENOUS

## 2019-05-06 MED ORDER — HEPARIN SODIUM (PORCINE) 1000 UNIT/ML IJ SOLN
INTRAMUSCULAR | Status: AC
  Filled 2019-05-06: qty 1

## 2019-05-06 MED ORDER — VANCOMYCIN HCL IN DEXTROSE 1-5 GM/200ML-% IV SOLN: 1000.0000 mg | Freq: Once | INTRAVENOUS | Status: DC

## 2019-05-06 MED ORDER — HEMOSTATIC AGENTS (NO CHARGE) OPTIME
TOPICAL | Status: DC | PRN
  Administered 2019-05-06: 1 via TOPICAL

## 2019-05-06 MED ORDER — PHENYLEPHRINE 40 MCG/ML (10ML) SYRINGE FOR IV PUSH (FOR BLOOD PRESSURE SUPPORT)
PREFILLED_SYRINGE | INTRAVENOUS | Status: AC
  Filled 2019-05-06: qty 30

## 2019-05-06 MED ORDER — ROCURONIUM BROMIDE 10 MG/ML (PF) SYRINGE
PREFILLED_SYRINGE | INTRAVENOUS | Status: AC
  Filled 2019-05-06: qty 20

## 2019-05-06 MED ORDER — FENTANYL CITRATE (PF) 100 MCG/2ML IJ SOLN
INTRAMUSCULAR | Status: DC | PRN
  Administered 2019-05-06: 250 ug via INTRAVENOUS
  Administered 2019-05-06: 150 ug via INTRAVENOUS
  Administered 2019-05-06: 100 ug via INTRAVENOUS
  Administered 2019-05-06: 150 ug via INTRAVENOUS
  Administered 2019-05-06: 200 ug via INTRAVENOUS
  Administered 2019-05-06: 50 ug via INTRAVENOUS
  Administered 2019-05-06 (×2): 100 ug via INTRAVENOUS
  Administered 2019-05-06: 150 ug via INTRAVENOUS

## 2019-05-06 MED ORDER — SODIUM CHLORIDE (PF) 0.9 % IJ SOLN
OROMUCOSAL | Status: DC | PRN
  Administered 2019-05-06 (×3): 4 mL via TOPICAL

## 2019-05-06 MED ORDER — PHENYLEPHRINE HCL-NACL 20-0.9 MG/250ML-% IV SOLN
30.0000 ug/min | INTRAVENOUS | Status: DC
  Filled 2019-05-06: qty 250

## 2019-05-06 MED ORDER — MILRINONE LACTATE IN DEXTROSE 20-5 MG/100ML-% IV SOLN
0.3000 ug/kg/min | INTRAVENOUS | Status: DC
  Filled 2019-05-06: qty 100

## 2019-05-06 MED ORDER — DIPHENHYDRAMINE HCL 50 MG/ML IJ SOLN
INTRAMUSCULAR | Status: DC | PRN
  Administered 2019-05-06: 25 mg via INTRAVENOUS

## 2019-05-06 MED ORDER — PLASMA-LYTE 148 IV SOLN
INTRAVENOUS | Status: DC
  Filled 2019-05-06: qty 2.5

## 2019-05-06 MED ORDER — METOPROLOL TARTRATE 12.5 MG HALF TABLET: 12.5000 mg | ORAL_TABLET | Freq: Once | ORAL | Status: DC

## 2019-05-06 SURGICAL SUPPLY — 189 items
ADAPTER CARDIO PERF ANTE/RETRO (ADAPTER) ×7 IMPLANT
ADH SKN CLS APL DERMABOND .7 (GAUZE/BANDAGES/DRESSINGS) ×2
ADH SRG 12 PREFL SYR 3 SPRDR (MISCELLANEOUS)
ADPR PRFSN 84XANTGRD RTRGD (ADAPTER) ×6
APPLICATOR TIP STD SYR BGAT-SY (MISCELLANEOUS) IMPLANT
ATTRACTOMAT 16X20 MAGNETIC DRP (DRAPES) ×3 IMPLANT
BAG DECANTER FOR FLEXI CONT (MISCELLANEOUS) ×6 IMPLANT
BASKET HEART (ORDER IN 25'S) (MISCELLANEOUS) ×1
BASKET HEART (ORDER IN 25S) (MISCELLANEOUS) ×2 IMPLANT
BLADE CLIPPER SURG (BLADE) ×7 IMPLANT
BLADE CORE FAN STRYKER (BLADE) ×5 IMPLANT
BLADE MINI RND TIP GREEN BEAV (BLADE) ×1 IMPLANT
BLADE NDL 3 SS STRL (BLADE) IMPLANT
BLADE NEEDLE 3 SS STRL (BLADE) ×3 IMPLANT
BLADE OSCILLATING /SAGITTAL (BLADE) ×3 IMPLANT
BLADE SAW SAG 29X58X.64 (BLADE) IMPLANT
BLADE STERNUM SYSTEM 6 (BLADE) ×5 IMPLANT
BLADE SURG 11 STRL SS (BLADE) ×7 IMPLANT
BLADE SURG 15 STRL LF DISP TIS (BLADE) ×2 IMPLANT
BLADE SURG 15 STRL SS (BLADE) ×9
BNDG ELASTIC 4X5.8 VLCR STR LF (GAUZE/BANDAGES/DRESSINGS) ×3 IMPLANT
BNDG ELASTIC 6X5.8 VLCR STR LF (GAUZE/BANDAGES/DRESSINGS) ×3 IMPLANT
BNDG GAUZE ELAST 4 BULKY (GAUZE/BANDAGES/DRESSINGS) ×3 IMPLANT
CANISTER SUCT 3000ML PPV (MISCELLANEOUS) ×6 IMPLANT
CANN PRFSN .5XCNCT 15X34-48 (MISCELLANEOUS)
CANNULA AORTIC ROOT 9FR (CANNULA) ×1 IMPLANT
CANNULA EZ GLIDE AORTIC 21FR (CANNULA) ×2 IMPLANT
CANNULA FEM VENOUS REMOTE 22FR (CANNULA) ×1 IMPLANT
CANNULA GUNDRY RCSP 15FR (MISCELLANEOUS) ×7 IMPLANT
CANNULA OPTISITE PERFUSION 20F (CANNULA) ×1 IMPLANT
CANNULA PRFSN .5XCNCT 15X34-48 (MISCELLANEOUS) IMPLANT
CANNULA SUMP PERICARDIAL (CANNULA) ×3 IMPLANT
CANNULA VEN 2 STAGE (MISCELLANEOUS)
CANNULA VESSEL 3MM BLUNT TIP (CANNULA) ×1 IMPLANT
CARDIOBLATE CARDIAC ABLATION (MISCELLANEOUS)
CATH CPB KIT OWEN (MISCELLANEOUS) ×3 IMPLANT
CATH HEART VENT LEFT (CATHETERS) ×4 IMPLANT
CATH ROBINSON RED A/P 18FR (CATHETERS) ×9 IMPLANT
CATH THORACIC 28FR (CATHETERS) IMPLANT
CATH THORACIC 36FR (CATHETERS) ×3 IMPLANT
CATH THORACIC 36FR RT ANG (CATHETERS) ×3 IMPLANT
CLIP FOGARTY SPRING 6M (CLIP) IMPLANT
CLIP VESOCCLUDE MED 6/CT (CLIP) IMPLANT
CLIP VESOCCLUDE SM WIDE 24/CT (CLIP) IMPLANT
CLIP VESOCCLUDE SM WIDE 6/CT (CLIP) IMPLANT
CONN 1/2X1/2X1/2  BEN (MISCELLANEOUS) ×1
CONN 1/2X1/2X1/2 BEN (MISCELLANEOUS) ×2 IMPLANT
CONN 3/8X1/2 ST GISH (MISCELLANEOUS) ×6 IMPLANT
CONN 3/8X3/8 GISH STERILE (MISCELLANEOUS) IMPLANT
CONN ST 1/4X3/8  BEN (MISCELLANEOUS) ×3
CONN ST 1/4X3/8 BEN (MISCELLANEOUS) IMPLANT
CONN Y 3/8X3/8X3/8  BEN (MISCELLANEOUS)
CONN Y 3/8X3/8X3/8 BEN (MISCELLANEOUS) IMPLANT
CONNECTOR 1/2X3/8X1/2 3 WAY (MISCELLANEOUS) ×1
CONNECTOR 1/2X3/8X1/2 3WAY (MISCELLANEOUS) IMPLANT
CONT SPEC 4OZ CLIKSEAL STRL BL (MISCELLANEOUS) ×2 IMPLANT
COVER MAYO STAND STRL (DRAPES) ×1 IMPLANT
COVER PROBE W GEL 5X96 (DRAPES) ×3 IMPLANT
COVER SURGICAL LIGHT HANDLE (MISCELLANEOUS) ×6 IMPLANT
DEFOGGER ANTIFOG KIT (MISCELLANEOUS) ×2 IMPLANT
DERMABOND ADVANCED (GAUZE/BANDAGES/DRESSINGS) ×1
DERMABOND ADVANCED .7 DNX12 (GAUZE/BANDAGES/DRESSINGS) IMPLANT
DEVICE CARDIOBLATE CARDIAC ABL (MISCELLANEOUS) IMPLANT
DEVICE CLOSURE PERCLS PRGLD 6F (VASCULAR PRODUCTS) IMPLANT
DRAIN CHANNEL 32F RND 10.7 FF (WOUND CARE) ×3 IMPLANT
DRAPE CARDIOVASCULAR INCISE (DRAPES)
DRAPE CV SPLIT W-CLR ANES SCRN (DRAPES) ×1 IMPLANT
DRAPE INCISE IOBAN 66X45 STRL (DRAPES) ×4 IMPLANT
DRAPE PERI GROIN 82X75IN TIB (DRAPES) ×1 IMPLANT
DRAPE SLUSH/WARMER DISC (DRAPES) ×3 IMPLANT
DRAPE SRG 135X102X78XABS (DRAPES) ×2 IMPLANT
DRSG AQUACEL AG ADV 3.5X14 (GAUZE/BANDAGES/DRESSINGS) ×5 IMPLANT
ELECT BLADE 4.0 EZ CLEAN MEGAD (MISCELLANEOUS) ×3
ELECT REM PT RETURN 9FT ADLT (ELECTROSURGICAL) ×12
ELECTRODE BLDE 4.0 EZ CLN MEGD (MISCELLANEOUS) IMPLANT
ELECTRODE REM PT RTRN 9FT ADLT (ELECTROSURGICAL) ×8 IMPLANT
FELT TEFLON 1X6 (MISCELLANEOUS) ×6 IMPLANT
FELT TEFLON 6X6 (MISCELLANEOUS) IMPLANT
GAUZE SPONGE 4X4 12PLY STRL (GAUZE/BANDAGES/DRESSINGS) ×12 IMPLANT
GAUZE SPONGE 4X4 12PLY STRL LF (GAUZE/BANDAGES/DRESSINGS) ×2 IMPLANT
GLOVE BIO SURGEON STRL SZ 6 (GLOVE) ×1 IMPLANT
GLOVE BIO SURGEON STRL SZ 6.5 (GLOVE) ×9 IMPLANT
GLOVE BIO SURGEON STRL SZ7 (GLOVE) IMPLANT
GLOVE BIO SURGEON STRL SZ7.5 (GLOVE) ×2 IMPLANT
GLOVE BIO SURGEON STRL SZ8 (GLOVE) ×1 IMPLANT
GLOVE BIOGEL PI IND STRL 6.5 (GLOVE) IMPLANT
GLOVE BIOGEL PI IND STRL 7.0 (GLOVE) IMPLANT
GLOVE BIOGEL PI INDICATOR 6.5 (GLOVE) ×2
GLOVE BIOGEL PI INDICATOR 7.0 (GLOVE) ×2
GLOVE ORTHO TXT STRL SZ7.5 (GLOVE) ×10 IMPLANT
GLOVE SURG SS PI 6.0 STRL IVOR (GLOVE) ×2 IMPLANT
GOWN STRL REUS W/ TWL LRG LVL3 (GOWN DISPOSABLE) ×16 IMPLANT
GOWN STRL REUS W/TWL LRG LVL3 (GOWN DISPOSABLE) ×42
GRAFT VALVE AORTIC CRYO 23 (Valve) IMPLANT
HEMOSTAT POWDER SURGIFOAM 1G (HEMOSTASIS) ×15 IMPLANT
INSERT FOGARTY SM (MISCELLANEOUS) IMPLANT
INSERT FOGARTY XLG (MISCELLANEOUS) ×6 IMPLANT
IV NS IRRIG 3000ML ARTHROMATIC (IV SOLUTION) ×1 IMPLANT
KIT BASIN OR (CUSTOM PROCEDURE TRAY) ×6 IMPLANT
KIT DRAINAGE VACCUM ASSIST (KITS) ×1 IMPLANT
KIT SUCTION CATH 14FR (SUCTIONS) ×9 IMPLANT
KIT SUT CK MINI COMBO 4X17 (Prosthesis & Implant Heart) ×1 IMPLANT
KIT TURNOVER KIT B (KITS) ×6 IMPLANT
KIT VASOVIEW HEMOPRO 2 VH 4000 (KITS) ×1 IMPLANT
LEAD PACING MYOCARDI (MISCELLANEOUS) ×6 IMPLANT
LINE VENT (MISCELLANEOUS) ×1 IMPLANT
LOOP VESSEL SUPERMAXI WHITE (MISCELLANEOUS) ×6 IMPLANT
MARKER GRAFT CORONARY BYPASS (MISCELLANEOUS) ×10 IMPLANT
NDL AORTIC AIR ASPIRATING (NEEDLE) IMPLANT
NEEDLE AORTIC AIR ASPIRATING (NEEDLE) IMPLANT
NS IRRIG 1000ML POUR BTL (IV SOLUTION) ×33 IMPLANT
PACK E OPEN HEART (SUTURE) ×3 IMPLANT
PACK OPEN HEART (CUSTOM PROCEDURE TRAY) ×6 IMPLANT
PAD ARMBOARD 7.5X6 YLW CONV (MISCELLANEOUS) ×12 IMPLANT
PAD DEFIB R2 (MISCELLANEOUS) IMPLANT
PAD ELECT DEFIB RADIOL ZOLL (MISCELLANEOUS) ×6 IMPLANT
PENCIL BUTTON HOLSTER BLD 10FT (ELECTRODE) ×4 IMPLANT
PERCLOSE PROGLIDE 6F (VASCULAR PRODUCTS) ×6
POSITIONER HEAD DONUT 9IN (MISCELLANEOUS) ×3 IMPLANT
POWDER SURGICEL 3.0 GRAM (HEMOSTASIS) ×1 IMPLANT
PUNCH AORTIC ROT 5.0MM RCL 50 (MISCELLANEOUS) ×1 IMPLANT
SEALANT SURG COSEAL 8ML (VASCULAR PRODUCTS) ×1 IMPLANT
SET CARDIOPLEGIA MPS 5001102 (MISCELLANEOUS) ×1 IMPLANT
SET IRRIG TUBING LAPAROSCOPIC (IRRIGATION / IRRIGATOR) ×4 IMPLANT
SPONGE LAP 18X18 RF (DISPOSABLE) ×3 IMPLANT
SPONGE LAP 4X18 RFD (DISPOSABLE) ×1 IMPLANT
STOPCOCK 4 WAY LG BORE MALE ST (IV SETS) IMPLANT
SUT BONE WAX W31G (SUTURE) ×3 IMPLANT
SUT ETHIBON 2 0 V 52N 30 (SUTURE) IMPLANT
SUT ETHIBON EXCEL 2-0 V-5 (SUTURE) IMPLANT
SUT ETHIBOND 2 0 SH (SUTURE) ×9 IMPLANT
SUT ETHIBOND 2 0 SH 36X2 (SUTURE) ×4 IMPLANT
SUT ETHIBOND 2 0 V4 (SUTURE) IMPLANT
SUT ETHIBOND 2 0V4 GREEN (SUTURE) IMPLANT
SUT ETHIBOND 4 0 RB 1 (SUTURE) ×6 IMPLANT
SUT ETHIBOND V-5 VALVE (SUTURE) IMPLANT
SUT ETHIBOND X763 2 0 SH 1 (SUTURE) ×9 IMPLANT
SUT MNCRL AB 3-0 PS2 18 (SUTURE) ×12 IMPLANT
SUT MNCRL AB 4-0 PS2 18 (SUTURE) ×1 IMPLANT
SUT PDS AB 1 CTX 36 (SUTURE) ×12 IMPLANT
SUT PROLENE 3 0 RB 1 (SUTURE) IMPLANT
SUT PROLENE 3 0 SH DA (SUTURE) ×5 IMPLANT
SUT PROLENE 3 0 SH1 36 (SUTURE) ×17 IMPLANT
SUT PROLENE 4 0 RB 1 (SUTURE) ×27
SUT PROLENE 4 0 SH DA (SUTURE) IMPLANT
SUT PROLENE 4-0 RB1 .5 CRCL 36 (SUTURE) ×4 IMPLANT
SUT PROLENE 5 0 C 1 36 (SUTURE) ×8 IMPLANT
SUT PROLENE 6 0 C 1 30 (SUTURE) ×2 IMPLANT
SUT PROLENE 7.0 RB 3 (SUTURE) ×11 IMPLANT
SUT PROLENE BLUE 7 0 (SUTURE) ×3 IMPLANT
SUT PROLENE POLY MONO (SUTURE) ×2 IMPLANT
SUT SILK  1 MH (SUTURE) ×4
SUT SILK 1 MH (SUTURE) ×6 IMPLANT
SUT SILK 2 0 SH CR/8 (SUTURE) ×1 IMPLANT
SUT SILK 3 0 SH CR/8 (SUTURE) IMPLANT
SUT STEEL 6MS V (SUTURE) IMPLANT
SUT STEEL STERNAL CCS#1 18IN (SUTURE) IMPLANT
SUT STEEL SZ 6 DBL 3X14 BALL (SUTURE) IMPLANT
SUT VIC AB 1 CT1 18XCR BRD 8 (SUTURE) IMPLANT
SUT VIC AB 1 CT1 8-18 (SUTURE)
SUT VIC AB 1 CTX 27 (SUTURE) IMPLANT
SUT VIC AB 1 CTX 36 (SUTURE) ×3
SUT VIC AB 1 CTX36XBRD ANBCTR (SUTURE) IMPLANT
SUT VIC AB 2-0 CT1 27 (SUTURE) ×3
SUT VIC AB 2-0 CT1 TAPERPNT 27 (SUTURE) IMPLANT
SUT VIC AB 2-0 CTX 27 (SUTURE) ×3 IMPLANT
SUT VIC AB 2-0 CTX 36 (SUTURE) ×6 IMPLANT
SUT VIC AB 3-0 SH 27 (SUTURE)
SUT VIC AB 3-0 SH 27X BRD (SUTURE) IMPLANT
SUT VIC AB 3-0 SH 8-18 (SUTURE) ×1 IMPLANT
SUT VIC AB 3-0 X1 27 (SUTURE) IMPLANT
SUT VICRYL 4-0 PS2 18IN ABS (SUTURE) IMPLANT
SYR 10ML KIT SKIN ADHESIVE (MISCELLANEOUS) ×2 IMPLANT
SYSTEM SAHARA CHEST DRAIN ATS (WOUND CARE) ×6 IMPLANT
TAPE CLOTH SURG 4X10 WHT LF (GAUZE/BANDAGES/DRESSINGS) ×1 IMPLANT
TAPE PAPER 3X10 WHT MICROPORE (GAUZE/BANDAGES/DRESSINGS) ×1 IMPLANT
TOWEL GREEN STERILE (TOWEL DISPOSABLE) ×6 IMPLANT
TOWEL GREEN STERILE FF (TOWEL DISPOSABLE) ×6 IMPLANT
TRAY CATH LUMEN 1 20CM STRL (SET/KITS/TRAYS/PACK) IMPLANT
TRAY FOLEY SLVR 14FR TEMP STAT (SET/KITS/TRAYS/PACK) ×3 IMPLANT
TRAY FOLEY SLVR 16FR TEMP STAT (SET/KITS/TRAYS/PACK) ×3 IMPLANT
TUBE CONNECTING 20X1/4 (TUBING) ×1 IMPLANT
TUBE SUCTION CARDIAC 10FR (CANNULA) ×1 IMPLANT
TUBING LAP HI FLOW INSUFFLATIO (TUBING) ×1 IMPLANT
UNDERPAD 30X30 (UNDERPADS AND DIAPERS) ×3 IMPLANT
VALVE AORTIC CRYO 23MM (Valve) ×3 IMPLANT
VENT LEFT HEART 12002 (CATHETERS) ×9
WATER STERILE IRR 1000ML POUR (IV SOLUTION) ×12 IMPLANT
YANKAUER SUCT BULB TIP NO VENT (SUCTIONS) ×1 IMPLANT

## 2019-05-06 SURGICAL SUPPLY — 63 items
BAG DECANTER FOR FLEXI CONT (MISCELLANEOUS) ×1 IMPLANT
BLADE CLIPPER SURG (BLADE) ×1 IMPLANT
CANISTER SUCT 3000ML PPV (MISCELLANEOUS) ×1 IMPLANT
CLIP FOGARTY SPRING 6M (CLIP) IMPLANT
CLIP VESOCCLUDE MED 24/CT (CLIP) IMPLANT
CLIP VESOCCLUDE SM WIDE 24/CT (CLIP) IMPLANT
DRAPE CARDIOVASCULAR INCISE (DRAPES)
DRAPE CHEST BREAST 15X10 FENES (DRAPES) ×1 IMPLANT
DRAPE SRG 135X102X78XABS (DRAPES) ×1 IMPLANT
DRAPE TABLE BACK 80X90 (DRAPES) ×1 IMPLANT
DRSG AQUACEL AG ADV 3.5X14 (GAUZE/BANDAGES/DRESSINGS) ×1 IMPLANT
ELECT REM PT RETURN 9FT ADLT (ELECTROSURGICAL)
ELECTRODE REM PT RTRN 9FT ADLT (ELECTROSURGICAL) ×2 IMPLANT
FELT TEFLON 1X6 (MISCELLANEOUS) ×1 IMPLANT
GAUZE SPONGE 4X4 12PLY STRL (GAUZE/BANDAGES/DRESSINGS) ×2 IMPLANT
GLOVE BIO SURGEON STRL SZ7 (GLOVE) ×2 IMPLANT
GLOVE ORTHO TXT STRL SZ7.5 (GLOVE) ×2 IMPLANT
GLOVE SURG SIGNA 7.5 PF LTX (GLOVE) ×1 IMPLANT
GOWN STRL REUS W/ TWL LRG LVL3 (GOWN DISPOSABLE) ×4 IMPLANT
GOWN STRL REUS W/TWL LRG LVL3 (GOWN DISPOSABLE) ×8
HEMOSTAT POWDER SURGIFOAM 1G (HEMOSTASIS) ×3 IMPLANT
HEMOSTAT SURGICEL 2X14 (HEMOSTASIS) ×1 IMPLANT
INSERT FOGARTY XLG (MISCELLANEOUS) ×1 IMPLANT
KIT BASIN OR (CUSTOM PROCEDURE TRAY) ×1 IMPLANT
KIT SUCTION CATH 14FR (SUCTIONS) ×1 IMPLANT
KIT TURNOVER KIT B (KITS) ×1 IMPLANT
NS IRRIG 1000ML POUR BTL (IV SOLUTION) ×5 IMPLANT
PACK CHEST (CUSTOM PROCEDURE TRAY) ×1 IMPLANT
PACK E OPEN HEART (SUTURE) ×1 IMPLANT
PACK OPEN HEART (CUSTOM PROCEDURE TRAY) IMPLANT
PAD ARMBOARD 7.5X6 YLW CONV (MISCELLANEOUS) ×3 IMPLANT
PAD ELECT DEFIB RADIOL ZOLL (MISCELLANEOUS) ×1 IMPLANT
POSITIONER HEAD DONUT 9IN (MISCELLANEOUS) ×1 IMPLANT
SPONGE LAP 18X18 RF (DISPOSABLE) ×2 IMPLANT
SPONGE LAP 4X18 RFD (DISPOSABLE) IMPLANT
STOPCOCK 4 WAY LG BORE MALE ST (IV SETS) IMPLANT
SUT ETHIBOND 2 0 SH (SUTURE)
SUT ETHIBOND 2 0 SH 36X2 (SUTURE) IMPLANT
SUT MNCRL AB 4-0 PS2 18 (SUTURE) IMPLANT
SUT PDS AB 1 CTX 36 (SUTURE) ×4 IMPLANT
SUT PROLENE 3 0 SH 1 (SUTURE) IMPLANT
SUT PROLENE 3 0 SH DA (SUTURE) ×1 IMPLANT
SUT PROLENE 4 0 RB 1 (SUTURE) ×2
SUT PROLENE 4-0 RB1 .5 CRCL 36 (SUTURE) IMPLANT
SUT PROLENE 5 0 C 1 36 (SUTURE) IMPLANT
SUT PROLENE 6 0 C 1 30 (SUTURE) IMPLANT
SUT PROLENE 7 0 BV 1 (SUTURE) IMPLANT
SUT PROLENE 7 0 BV1 MDA (SUTURE) IMPLANT
SUT PROLENE 8 0 BV175 6 (SUTURE) IMPLANT
SUT STEEL 6MS V (SUTURE) IMPLANT
SUT STEEL STERNAL CCS#1 18IN (SUTURE) IMPLANT
SUT STEEL SZ 6 DBL 3X14 BALL (SUTURE) IMPLANT
SUT VIC AB 2-0 CT1 36 (SUTURE) IMPLANT
SUT VIC AB 2-0 CTX 27 (SUTURE) IMPLANT
SUT VIC AB 3-0 SH 27 (SUTURE)
SUT VIC AB 3-0 SH 27X BRD (SUTURE) IMPLANT
SUT VICRYL 4-0 PS2 18IN ABS (SUTURE) IMPLANT
SYSTEM SAHARA CHEST DRAIN ATS (WOUND CARE) IMPLANT
TOWEL GREEN STERILE (TOWEL DISPOSABLE) ×1 IMPLANT
TOWEL GREEN STERILE FF (TOWEL DISPOSABLE) ×1 IMPLANT
TRAY CATH LUMEN 1 20CM STRL (SET/KITS/TRAYS/PACK) IMPLANT
UNDERPAD 30X30 (UNDERPADS AND DIAPERS) ×1 IMPLANT
WATER STERILE IRR 1000ML POUR (IV SOLUTION) ×2 IMPLANT

## 2019-05-07 LAB — BPAM FFP
Blood Product Expiration Date: 202102172359
Blood Product Expiration Date: 202102182359
Blood Product Expiration Date: 202102192359
ISSUE DATE / TIME: 202102161359
ISSUE DATE / TIME: 202102161359
ISSUE DATE / TIME: 202102161608
Unit Type and Rh: 6200
Unit Type and Rh: 6200
Unit Type and Rh: 6200

## 2019-05-07 LAB — PREPARE FRESH FROZEN PLASMA
Unit division: 0
Unit division: 0
Unit division: 0

## 2019-05-07 LAB — PREPARE PLATELET PHERESIS
Unit division: 0
Unit division: 0

## 2019-05-07 LAB — POCT I-STAT 7, (LYTES, BLD GAS, ICA,H+H)
Acid-base deficit: 3 mmol/L — ABNORMAL HIGH (ref 0.0–2.0)
Bicarbonate: 25.3 mmol/L (ref 20.0–28.0)
Calcium, Ion: 1.04 mmol/L — ABNORMAL LOW (ref 1.15–1.40)
HCT: 29 % — ABNORMAL LOW (ref 36.0–46.0)
Hemoglobin: 9.9 g/dL — ABNORMAL LOW (ref 12.0–15.0)
O2 Saturation: 92 %
Patient temperature: 35.4
Potassium: 4.4 mmol/L (ref 3.5–5.1)
Sodium: 142 mmol/L (ref 135–145)
TCO2: 27 mmol/L (ref 22–32)
pCO2 arterial: 60.7 mmHg — ABNORMAL HIGH (ref 32.0–48.0)
pH, Arterial: 7.219 — ABNORMAL LOW (ref 7.350–7.450)
pO2, Arterial: 71 mmHg — ABNORMAL LOW (ref 83.0–108.0)

## 2019-05-07 LAB — BPAM PLATELET PHERESIS
Blood Product Expiration Date: 202102182359
Blood Product Expiration Date: 202102171552
ISSUE DATE / TIME: 202102161359
ISSUE DATE / TIME: 202102161608
Unit Type and Rh: 6200
Unit Type and Rh: 6200

## 2019-05-07 LAB — SURGICAL PATHOLOGY

## 2019-05-07 MED FILL — Medication: Qty: 1 | Status: AC

## 2019-05-08 LAB — BPAM RBC
Blood Product Expiration Date: 202103162359
Blood Product Expiration Date: 202103162359
Blood Product Expiration Date: 202103172359
Blood Product Expiration Date: 202103172359
ISSUE DATE / TIME: 202102160743
ISSUE DATE / TIME: 202102160743
ISSUE DATE / TIME: 202102160743
ISSUE DATE / TIME: 202102160743
Unit Type and Rh: 6200
Unit Type and Rh: 6200
Unit Type and Rh: 6200
Unit Type and Rh: 6200

## 2019-05-08 LAB — TYPE AND SCREEN
ABO/RH(D): A POS
Antibody Screen: NEGATIVE
Unit division: 0
Unit division: 0
Unit division: 0
Unit division: 0

## 2019-05-09 MED FILL — Cefuroxime Sodium For Inj 750 MG: INTRAMUSCULAR | Qty: 750 | Status: AC

## 2019-05-09 MED FILL — Lidocaine HCl Local Preservative Free (PF) Inj 2%: INTRAMUSCULAR | Qty: 15 | Status: AC

## 2019-05-09 MED FILL — Potassium Chloride Inj 2 mEq/ML: INTRAVENOUS | Qty: 40 | Status: AC

## 2019-05-09 MED FILL — Heparin Sodium (Porcine) Inj 1000 Unit/ML: INTRAMUSCULAR | Qty: 30 | Status: AC

## 2019-05-12 MED FILL — Lidocaine HCl Local Preservative Free (PF) Inj 2%: INTRAMUSCULAR | Qty: 15 | Status: AC

## 2019-05-12 MED FILL — Cefuroxime Sodium For Inj 750 MG: INTRAMUSCULAR | Qty: 750 | Status: AC

## 2019-05-12 MED FILL — Sodium Chloride IV Soln 0.9%: INTRAVENOUS | Qty: 4000 | Status: AC

## 2019-05-12 MED FILL — Potassium Chloride Inj 2 mEq/ML: INTRAVENOUS | Qty: 40 | Status: AC

## 2019-05-12 MED FILL — Sodium Bicarbonate IV Soln 8.4%: INTRAVENOUS | Qty: 50 | Status: AC

## 2019-05-12 MED FILL — Magnesium Sulfate Inj 50%: INTRAMUSCULAR | Qty: 10 | Status: AC

## 2019-05-12 MED FILL — Electrolyte-R (PH 7.4) Solution: INTRAVENOUS | Qty: 9000 | Status: AC

## 2019-05-12 MED FILL — Calcium Chloride Inj 10%: INTRAVENOUS | Qty: 10 | Status: AC

## 2019-05-12 MED FILL — Heparin Sodium (Porcine) Inj 1000 Unit/ML: INTRAMUSCULAR | Qty: 30 | Status: AC

## 2019-05-12 MED FILL — Mannitol IV Soln 20%: INTRAVENOUS | Qty: 500 | Status: AC

## 2019-05-19 NOTE — Anesthesia Procedure Notes (Signed)
Arterial Line Insertion Start/End02/19/2021 7:05 AM, 2019/05/09 7:10 AM Performed by: Inda Coke, CRNA, CRNA  Preanesthetic checklist: patient identified, IV checked, site marked, risks and benefits discussed, surgical consent, monitors and equipment checked, pre-op evaluation, timeout performed and anesthesia consent Patient sedated Left, radial was placed Catheter size: 20 G Hand hygiene performed  and maximum sterile barriers used  Allen's test indicative of satisfactory collateral circulation Attempts: 1 Procedure performed without using ultrasound guided technique. Ultrasound Notes:anatomy identified Following insertion, dressing applied and Biopatch. Post procedure assessment: normal  Patient tolerated the procedure well with no immediate complications.

## 2019-05-19 NOTE — Progress Notes (Signed)
Chaplain provided support to Mr. Terri Edwards and his son Terri Edwards.  Chaplain was able to provide guidance to family in the steps after spending time with Terri Edwards at bedside.  Chaplain affirmed Terri Edwards and Terri Edwards's grief and offered care until they left the main entrance.  Chaplain is available for follow-up.

## 2019-05-19 NOTE — Anesthesia Procedure Notes (Addendum)
Procedure Name: Intubation Date/Time: June 04, 2019 7:51 AM Performed by: Inda Coke, CRNA Pre-anesthesia Checklist: Patient identified, Emergency Drugs available, Suction available and Patient being monitored Patient Re-evaluated:Patient Re-evaluated prior to induction Oxygen Delivery Method: Circle System Utilized Preoxygenation: Pre-oxygenation with 100% oxygen Induction Type: IV induction Ventilation: Mask ventilation without difficulty Laryngoscope Size: Miller and 2 Grade View: Grade II Tube type: Oral Tube size: 8.0 mm Number of attempts: 1 Airway Equipment and Method: Stylet and Oral airway Placement Confirmation: ETT inserted through vocal cords under direct vision,  positive ETCO2 and breath sounds checked- equal and bilateral Secured at: 22 cm Tube secured with: Tape Dental Injury: Dental damage  Comments: DL x 1 with MAC  3 by SRNA and grade IV view. DL X 2 with Sabra Heck 2 by Dr. Tobias Alexander with esophageal intubation, -etco2. DL X 3 with Miller 2 by Dr. Tobias Alexander and tracheal intubation, + etco2, +BBS. Right front tooth with small chip noted.

## 2019-05-19 NOTE — Op Note (Signed)
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  Normal Left Ventricular Systolic Function  Moderate Left Ventricular Hypertrophy  Moderate calcification of aortic root and ascending thoracic aorta                BRIEF CLINICAL NOTE AND INDICATIONS FOR SURGERY  Patient is a 69 year old female with history of bicuspid aortic valve status post aortic valve replacement using a stented bovine pericardial tissue valve in 2013, hypertension, postoperative atrial fibrillation, pulmonary embolism, and hyperlipidemia whoreturns for follow up regarding prosthetic valve dysfunction with 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  Patient's cardiac history dates back to 2013 when she underwent elective aortic valve replacement using a 21 mm Edwards magna ease stented bovine pericardial tissue valve for bicuspid aortic valve with severe symptomatic aortic stenosis. The patient's surgical procedure and postoperative recovery was initially uneventful.She was briefly readmitted approximately 1  month postoperatively with atrial fibrillation and a small pulmonary embolism. She was anticoagulated using warfarin and has remained on warfarin ever since. She has been seen regularly on an annual basis by Dr. Stanford Breed, and most recent transthoracic echocardiogram performed February 05, 2017 revealed normal left ventricular systolic function with normal functioning bioprosthetic tissue valve in the aortic position. Peak velocity across the aortic valve and mean transvalvular gradient reported 3.3 m/s and 20 mmHg, respectively, unchanged from echocardiograms performed immediately following her surgery in 2013. 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 This revealed a 2.3 x 3.1 x 2.1 cm "extension of the left ventricular cavity" that in retrospect was felt to be present but smaller on the CT angiogram of the chest performed July 17, 2011 when the patient was diagnosed with acute pulmonary embolism. In follow-up a cardiac gated CT angiogram of the heart was performed March 14, 2018, confirming the presence of what appeared to be a pseudoaneurysm involving the left ventricular outflow tract just beneath the prosthetic valve along the noncoronary sinus of Valsalva. Cardiothoracic surgical consultation was requestedandI saw the patient in consultation on April 15, 2018. At that time the patient was essentially asymptomatic and did not wish to proceed with elective surgical intervention.  Over the past year the patient has developed gradual worsening of exertional shortness of breath. She was hospitalized from March 30, 2019 to April 01, 2019 with acute exacerbation of shortness of breath. She was noticed to be anemic with hemoglobin 9.6, down 3 g from her previous baseline. She was also noted to have Hemoccult positive stool. Follow-up transthoracic echocardiogram revealed  increased gradient across the bioprosthetic tissue valve in the aortic position with severe  aortic stenosis. Peak velocity across aortic valve measured close to 4.5 m/s corresponding to mean transvalvular gradient estimated 50 mmHg and aortic valve area calculated 0.68 cm. The DVI was reported 0.37 and left ventricular systolic function remain normal. 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 Diagnostic cardiac catheterization was performed and notable for the absence of significant coronary artery disease. Patient had hyperdynamic cardiac output consistent with anemia. Upper GI endoscopy was performed and notable for the presence of a nonbleeding ulcer in the prepyloric region that measured 5 mm in its largest dimension. H. pylori testing was subsequently negative. The patient was placed on twice daily Protonix and has been seen in follow-up in the GI clinic. Hemoglobin has come up slightly to 10.0 and the patient underwent follow-up EGD which revealed that her ulcer had healed.  Follow-up gated CT angiogram of the heart revealed significant increase in size of the false aneurysm emanating from the LV outflow tract with new development of clot within the false aneurysm.  The patient was seen in consultation and counseled at length regarding the indications, risks and potential benefits of surgery.  All questions have been answered, and the patient provides full informed consent for the operation as described.     DETAILS OF THE OPERATIVE PROCEDURE  Preparation:  The patient is brought to the operating room on the above mentioned date and central monitoring was established by the anesthesia team including placement of Swan-Ganz catheter and radial arterial line. The patient is placed in the supine position on the operating table.  Intravenous antibiotics are administered. General endotracheal anesthesia is induced uneventfully. A Foley catheter is placed.  Baseline  transesophageal echocardiogram was performed.  Findings were notable for normal left ventricular systolic function with moderate left ventricular hypertrophy.  There was moderate calcification and somewhat decreased mobility of the 3 leaflets of the bioprosthetic tissue valve in the aortic position.  However, by planimetry valve area was greater than 1.0 cm.  The large false aneurysm in the left ventricular outflow tract was identified.  There is no sign of communication through the interventricular septum.  There was mild central mitral regurgitation.  No other abnormalities were noted.  The patient's chest, abdomen, both groins, and both lower extremities are prepared and draped in a sterile manner. A time out procedure is performed.   Percutaneous Vascular Access:  Percutaneous venous access were obtained on the right side.  Using ultrasound guidance the right common femoral vein was cannulated using the Seldinger technique and a pair of Perclose vascular closure devises were placed, after which time an 8 French sheath inserted.   The right internal jugular vein was cannulated  using ultrasound guidance and an 8 French sheath inserted.    Surgical Approach:  A redo median sternotomy incision was performed.  All the sternal wires were removed.  The sternum was divided with an oscillating saw.  Sternal reentry is uneventful.  Dissection is begun using electrocautery and sharp dissection to separate the posterior table of the sternum from the structures of the anterior mediastinum.  Dissection is continued in both directions until both the right and left pleural spaces were opened widely.  Dissection is now performed in the anterior mediastinum.  The ascending aorta is identified and dissection continued until the innominate vein is identified.    Extracorporeal Cardiopulmonary Bypass and Myocardial Protection:  The patient was heparinized systemically.  The transverse aortic arch is cannulated  using a 20 French femoral arterial cannula placed with  the Seldinger technique.  The right common femoral vein is cannulated through the venous sheath and a guidewire advanced into the right atrium using TEE guidance.  The femoral vein cannulated using a 22 Fr long femoral venous cannula.  The right internal jugular vein is cannulated through the venous sheath and a guidewire advanced into the right atrium.  The internal jugular vein is cannulated using a 14 French pediatric femoral venous cannula.   Adequate heparinization is verified.   Adequate heparinization is verified.   Cardiopulmonary bypass is begun.  Sharp dissection and electrocautery are now utilized to free up the structures of the mediastinum circumferentially.  Initially the entire aorta is dissected away from the pulmonary artery and the superior vena cava.  There is moderate calcification involving the ascending aorta.  The right atrium is now separated from adjacent structures.  A retrograde cardioplegia cannula is placed through the right atrium into the coronary sinus.  The intra-atrial groove was dissected and a left ventricular vent placed through the right superior pulmonary vein.  The operative field was continuously flooded with carbon dioxide gas.   A cardioplegia cannula is placed in the distal ascending aorta.    The patient is cooled to 28C systemic temperature.  The aortic cross clamp is applied and cardioplegia is delivered initially in an antegrade fashion through the aortic root using modified [place] cold blood cardioplegia ([place] blood cardioplegia protocol).   The initial cardioplegic arrest is rapid with early diastolic arrest.  Repeat doses of cardioplegia are administered at 90 minutes and every 30 minutes thereafter through the coronary sinus catheter in order to maintain completely flat electrocardiogram.  Myocardial protection was felt to be excellent.   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  This is technically challenging due to severe calcification surrounding the entire aortic root.  A transverse aortotomy incision is performed above the level of the aortotomy from the previous surgery.  The bioprosthetic valve was inspected.  There is moderate leaflet deterioration but no perforations.  There is no sign of infection.  The prosthetic valve is removed using sharp dissection.  Once the valve has been removed all pledget material and any remaining material from the valve skirt is excised sharply.  Beneath the level of the aortic annulus one can now appreciate the false aneurysm extending beneath the commissure between the left and right sinuses of Valsalva.  There is clot within this false aneurysm.  The clot is evacuated and the entire LV outflow tract is irrigated with copious warm saline solution.  Attention is now directed to mobilization of the coronary arteries for aortic root replacement.  Mobilization of the left main coronary artery is technically challenging due to calcification surrounding the orifice as well as close proximity to the aortic annulus.  Ultimately the left main coronary arteries successfully mobilized with adequate length for reimplantation.  The right coronary artery ostium is notably extremely close to the old suture line, surrounded in calcium, and essentially plastered to the aortic annulus.  Mobilization of the right coronary artery is absolutely not possible and a decision is made to ligate the orifice with plans for coronary artery bypass grafting.  After completion of mobilization of the left main coronary artery and debridement of the aortic annulus, the orifice of the false aneurysm is inspected and a plan is made to repair the false aneurysm by simple exclusion with implantation of  human allograft aortic root graft.  The aortic root is sized to accept a  23 mm human allograft aortic root graft.  A LifeNet HVA-L Human Aortic Valve (size 23 mm, ID # [id number]) is thawed and rinsed per protocol.  The proximal suture line is performed using interrupted 4-0 Ethibond simple sutures.  The proximal suture line is placed low in the left ventricular outflow tract, intentionally excluding the false aneurysm.  Once the human allograft has been completely thawed, the proximal end of the root graft is trimmed of unnecessary tissue and prepared for implantation.  The proximal suture line is completed and a total of 3 Cor-knot clips are placed to mark each of the 3 commissures with radiopaque structures.  The root graft seats appropriately and without difficulty.  The left main coronary artery is reimplanted into the corresponding left sinus of Valsalva of the aortic root graft with running 5-0 Prolene suture.  The distal end of the aortic root graft and the remaining ascending thoracic aorta are trimmed and beveled to appropriate lengths and the distal anastomosis is performed using running 4-0 Prolene suture.  Several additional pledgeted sutures are placed due to calcification in the native aortic wall.   Coronary Artery Bypass Grafting:  While subsequent steps for aortic root replacement are continued, the greater saphenous vein is removed from the patient's right thigh by the surgical assistants (TC) using endoscopic vein harvest technique.  The saphenous vein is notably good quality conduit.  After the saphenous vein has been removed, small surgical incisions are closed in routine fashion and the saphenous vein is prepared for bypass grafting.  The distal right coronary artery is grafted using a reversed greater saphenous vein graft in end-to-side fashion using running 7-0 Prolene suture.  At the site of distal anastomosis the right coronary artery measured 2.0 mm in diameter and is good quality.  The proximal end of the vein is trimmed to an appropriate  length and sewn directly to the distal portion of the human allograft aortic graft prior to removal of the aortic cross-clamp.   Procedure Completion:  One final dose of warm retrograde "reanimation dose" cardioplegia was administered retrograde through the coronary sinus catheter while all air was evacuated through the aortic root.  The aortic cross clamp was removed after a cross clamp time of 203 minutes.  Epicardial pacing wires are fixed to the right ventricular outflow tract and to the right atrial appendage. The patient is rewarmed to 37C temperature. The aortic and left ventricular vents are removed.  The patient is weaned and disconnected from cardiopulmonary bypass.  The patient's rhythm at separation from bypass was sinus bradycardia.  The patient was weaned from cardiopulmonary bypass without any inotropic support. Total cardiopulmonary bypass time for the operation was 246 minutes.  Followup transesophageal echocardiogram performed after separation from bypass revealed a normal appearing aortic valve with no aortic insufficiency.  Left ventricular function was unchanged from preoperatively.  The false aneurysm of the left ventricular outflow tract could no longer be visualized.  No other abnormalities were noted.  Minor bleeding is noted from the distal anastomosis between the human allograft aortic root graft and the native ascending thoracic aorta.  Attempts to directly repair the bleeding results in significant tearing of the native aorta.  Cardiopulmonary bypass is resumed to facilitate repair.  A cardioplegia cannula was replaced in the proximal allograft root graft and antegrade cardioplegia readministered through the aortic root.  The area of laceration of the native aorta is repaired  using a patch of human allograft tissue sewn circumferentially with running 4-0 Prolene suture.  The aortic cross-clamp was removed after a second cross-clamp time of 24 minutes such that the grand total  cross-clamp time for the entire operation is 227 minutes.  The patient is rested on cardiopulmonary bypass for several minutes and subsequently weaned and separated from cardiopulmonary bypass uneventfully.  The second cardiopulmonary bypass run duration was 45 minutes such that grand total for the entire operation was 291 minutes.  Follow-up transesophageal echocardiogram again reveals normal-appearing aortic valve with no aortic insufficiency.  Left ventricular function appears normal with no wall motion abnormalities.  The aortic and venous cannula were removed uneventfully. Protamine was administered to reverse the anticoagulation. The mediastinum and pleural space were inspected for hemostasis and irrigated with saline solution.  Significant coagulopathy was identified.  The patient was transfused 1 pack adult platelets and 2 units fresh frozen plasma due to coagulopathy with thrombocytopenia after reversal of heparin.  The patient received 2 units packed red blood cells during the procedure due to acute blood loss anemia and hemodilution during cardiopulmonary bypass.  Coagulopathy improves.  The mediastinum and both pleural spaces were drained using 4 chest tubes placed through separate stab incisions inferiorly.  The soft tissues anterior to the aorta were reapproximated loosely. The sternum is closed with double strength sternal wire. The soft tissues anterior to the sternum were closed in multiple layers and the skin is closed with a running subcuticular skin closure.  The post-bypass portion of the operation was notable for stable rhythm and hemodynamics.    Disposition:  The patient tolerated the procedure well and is transported to the surgical intensive care in stable condition. There are no intraoperative complications. All sponge instrument and needle counts are verified correct at completion of the operation.    Valentina Gu. Roxy Manns MD 2019-05-29 4:12 PM

## 2019-05-19 NOTE — Anesthesia Postprocedure Evaluation (Signed)
Anesthesia Post Note  Patient: Terri Edwards  Procedure(s) Performed: REDO AORTIC VALVE REPLACEMENT (AVR) (N/A Chest) HUMAN ALLOGRAFT AORTIC ROOT REPLACEMENT USING SIZE 23MM (N/A Chest) REPAIR OF FALSE ANEURYSM LV OUTFLOW TRACT (N/A ) TRANSESOPHAGEAL ECHOCARDIOGRAM (TEE) (N/A ) Coronary Artery Bypass Grafting (Cabg) x One, using right leg greater saphenous vein to RCA (N/A Chest) Reimplantation of Left Main Coronary Artery (N/A Chest)     Patient location during evaluation: ICU Anesthesia Type: General Level of consciousness: patient remains intubated per anesthesia plan Vital Signs Assessment: vitals unstable Respiratory status: patient remains intubated per anesthesia plan Cardiovascular status: unstable Anesthetic complications: no Comments: Pt developed VF arrest post in the ICU.  Despite ACLS, and emergent exploration in the ICU, the pt did not survive.                 Carrolyn Hilmes DANIEL

## 2019-05-19 NOTE — Transfer of Care (Signed)
Immediate Anesthesia Transfer of Care Note  Patient: TZIPPORAH JURICK  Procedure(s) Performed: REDO AORTIC VALVE REPLACEMENT (AVR) (N/A Chest) HUMAN ALLOGRAFT AORTIC ROOT REPLACEMENT USING SIZE 23MM (N/A Chest) REPAIR OF FALSE ANEURYSM LV OUTFLOW TRACT (N/A ) TRANSESOPHAGEAL ECHOCARDIOGRAM (TEE) (N/A ) Coronary Artery Bypass Grafting (Cabg) x One, using right leg greater saphenous vein to RCA (N/A Chest) Reimplantation of Left Main Coronary Artery (N/A Chest)  Patient Location: SICU  Anesthesia Type:General  Level of Consciousness: Patient remains intubated per anesthesia plan  Airway & Oxygen Therapy: Patient remains intubated per anesthesia plan and Patient placed on Ventilator (see vital sign flow sheet for setting)  Post-op Assessment: Report given to RN and Post -op Vital signs reviewed and stable  Post vital signs: Reviewed and stable  Last Vitals:  Vitals Value Taken Time  BP    Temp 35.2 C 05/17/2019 1703  Pulse 78 05/17/19 1703  Resp 17 May 17, 2019 1703  SpO2 96 % 17-May-2019 1703  Vitals shown include unvalidated device data.  Last Pain:  Vitals:   2019-05-17 0606  TempSrc:   PainSc: 0-No pain      Patients Stated Pain Goal: 3 ([id number])  Complications: No apparent anesthesia complications

## 2019-05-19 NOTE — Progress Notes (Signed)
  Echocardiogram Echocardiogram Transesophageal has been performed.  Terri Edwards A Terri Edwards 05-29-2019, 8:59 AM

## 2019-05-19 NOTE — Progress Notes (Signed)
[facility] SICU CODE DOCUMENTATION AND DEATH NOTE  Day of Surgery  S/P Procedure(s) (LRB): REDO AORTIC VALVE REPLACEMENT (AVR) (N/A) HUMAN ALLOGRAFT AORTIC ROOT REPLACEMENT USING SIZE 23MM (N/A) REPAIR OF FALSE ANEURYSM LV OUTFLOW TRACT (N/A) TRANSESOPHAGEAL ECHOCARDIOGRAM (TEE) (N/A) Coronary Artery Bypass Grafting (Cabg) x One, using right leg greater saphenous vein to RCA (N/A) Reimplantation of Left Main Coronary Artery (N/A)   Upon my arrival at bedside for routine early post-op rounds patient hypotensive w/ AV paced rhythm and frequent PVC's and runs of V-tach.  O2 sats 95-100% on vent.  Minimal chest tube output in either Pleur-evac cannister.  Pacer mode placed from DOO to DDD and patient immediately went into AF arrest.  CODE called and patient promptly cardioverted back into AV paced rhythm but she remained profoundly hypotensive.  CPR commenced. Multiple amps epinephrine and sodium bicarbonate given.  Massive volume resuscitation with colloid volume expanders given.  Patient had multiple recurrent episodes VF requiring cardioversion and could never maintain a pulse.  Sternotomy reopened at bedside in ICU.  No evidence of tamponade nor kinking of saphenous vein graft.  Despite open chest CPR with continued resuscitation efforts patient could not maintain rhythm or blood pressure.  Patient pronounced dead at 17:34.  Patient's husband immediately notified.  Rexene Alberts, MD June 01, 2019 6:00 PM

## 2019-05-19 NOTE — Progress Notes (Addendum)
Verbal order received from Dr. Roxy Manns to HOLD 12.5mg  dose of PO metoprolol prior to surgery.   Jacqlyn Larsen, RN

## 2019-05-19 NOTE — Death Summary Note (Signed)
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 Active Problems:   HYPERTENSION, BENIGN ESSENTIAL   Aortic valve disorder   Obesity (BMI 30.0-34.9)   DYSPNEA   AF (paroxysmal atrial fibrillation) (HCC)   Sleep apnea   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)   Pseudoaneurysm of left ventricle of heart   Prosthetic valve dysfunction   Severe aortic stenosis   S/P CABG x 1   Status post combined aortic root and valve replacement using stentless bioprosthetic aortic valve   Brief Hospital Course (including significant findings, care, treatment, and services provided and events leading to death)   Patient is a 69 year old female with history of bicuspid aortic valve status post aortic valve replacement using a stented bovine pericardial tissue valve in 2013, hypertension, postoperative atrial fibrillation, pulmonary embolism, and hyperlipidemia whoreturns for follow up regarding prosthetic valve dysfunction with 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  Patient's cardiac history dates back to 2013 when she underwent elective aortic valve replacement using a 21 mm Edwards magna ease stented bovine pericardial tissue valve for bicuspid aortic valve with severe symptomatic aortic stenosis. The patient's surgical procedure and postoperative recovery was initially uneventful.She was briefly readmitted approximately 1 month  postoperatively with atrial fibrillation and a small pulmonary embolism. She was anticoagulated using warfarin and has remained on warfarin ever since. She has been seen regularly on an annual basis by Dr. Stanford Breed, and most recent transthoracic echocardiogram performed February 05, 2017 revealed normal left ventricular systolic function with normal functioning bioprosthetic tissue valve in the aortic position. Peak velocity across the aortic valve and mean transvalvular gradient reported 3.3 m/s and 20 mmHg, respectively, unchanged from echocardiograms performed immediately following her surgery in 2013. 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 This revealed a 2.3 x 3.1 x 2.1 cm "extension of the left ventricular cavity" that in retrospect was felt to be present but smaller on the CT angiogram of the chest performed July 17, 2011 when the patient was diagnosed with acute pulmonary embolism. In follow-up a cardiac gated CT angiogram of the heart was performed March 14, 2018, confirming the presence of what appeared to be a pseudoaneurysm involving the left ventricular outflow tract just beneath the prosthetic valve along the noncoronary sinus of Valsalva. Cardiothoracic surgical consultation was requestedandI saw the patient in consultation on April 15, 2018. At that time the patient was essentially asymptomatic and did not wish to proceed with elective surgical intervention.  Over the past year the patient has developed gradual worsening of exertional shortness of breath. She was hospitalized from March 30, 2019 to April 01, 2019 with acute exacerbation of shortness of breath. She was noticed to be anemic with hemoglobin 9.6, down 3 g from her previous baseline. She was also noted to have Hemoccult positive stool. Follow-up transthoracic echocardiogram revealed increased  gradient across the bioprosthetic tissue valve in the aortic  position with severe aortic stenosis. Peak velocity across aortic valve measured close to 4.5 m/s corresponding to mean transvalvular gradient estimated 50 mmHg and aortic valve area calculated 0.68 cm. The DVI was reported 0.37 and left ventricular systolic function remain normal. 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 Diagnostic cardiac catheterization was performed and notable for the absence of significant coronary artery disease. Patient had hyperdynamic cardiac output consistent with anemia. Upper GI endoscopy was performed and notable for the presence of a nonbleeding ulcer in the prepyloric region that measured 5 mm in its largest dimension. H. pylori testing was subsequently negative. The patient was placed on twice daily Protonix and has been seen in follow-up in the GI clinic. Hemoglobin has come up slightly to 10.0 and the patient underwent follow-up EGD which revealed that her ulcer had healed.  Follow-up gated CT angiogram of the heart revealed significant increase in size of the false aneurysm emanating from the LV outflow tract with new development of clot within the false aneurysm.  The patient was seen in consultation and counseled at length regarding the indications, risks and potential benefits of surgery.  All questions have been answered, and the patient provides full informed consent for the operation as described.  Patient was admitted to the hospital for elective surgery on the morning of 2019/05/07.  Patient underwent redo aortic valve replacement using human allograft aortic root graft with exclusion repair of false aneurysm of the left ventricular outflow tract, reimplantation of left main coronary artery, and coronary artery bypass grafting x1 using reversed greater saphenous vein graft to the right coronary artery.  The native right coronary artery could not be reimplanted and  was oversewn.  The patient's operative procedure was challenging but uncomplicated and the patient was brought from the operating room to the surgical intensive care unit in stable condition.  Within the first hour after return from the operating room the patient developed hemodynamic lability with swings in blood pressure from hypertensive to hypotensive states.  This was associated with relatively low filling pressures and relatively low amount of output from chest tubes.  Patient began to developed frequent PVCs and runs of ventricular tachycardia.  Upon my arrival at the bedside the patient was notably hypotensive and within moments immediately went into ventricular fibrillation arrest.  Details of the patient's arrest and subsequent attempts to resuscitate the patient are documented separately.  The patient was cardioverted on multiple occasions, administered intravenous epinephrine and sodium bicarbonate, and resuscitated with volume as much as possible.  Despite this the patient could not maintain rhythm and blood pressure.  The patient's median sternotomy was reopened at the bedside in the ICU.  Findings were notable for the absence of clot or other signs of pericardial tamponade.  Saphenous vein graft to the right coronary artery appeared intact.  Despite open chest massage and continued aggressive measures, the patient could not be resuscitated.  The patient expired at 17:34 on 05-07-2019.  The patient's husband was present in the hospital and immediately notified.   Pertinent Labs and Studies  Significant Diagnostic Studies DG Chest 2 View  Result Date: 05/02/2019 CLINICAL DATA:  Preoperative exam. EXAM: CHEST - 2 VIEW COMPARISON:  March 30, 2019 FINDINGS: Stable sternotomy wires. Mild cardiomegaly. The hila and mediastinum are unchanged. No pneumothorax. No nodules or masses. Mild atelectasis in the lingula. IMPRESSION: No active cardiopulmonary disease. Electronically Signed   By:  Dorise Bullion III M.D   On:  05/02/2019 13:52   CT CORONARY MORPH W/CTA COR W/SCORE W/CA W/CM &/OR WO/CM  Addendum Date: 04/29/2019   ADDENDUM REPORT: 04/29/2019 12:51 CLINICAL DATA:  AVR Planning Redo Pseudo Aneurysm EXAM: Gated Cardiac CTA TECHNIQUE: The patient was scanned on a Siemens Force [id number] slice scanner. A 120 kV retrospective scan was triggered in the descending thoracic aorta at 111 HU's. Gantry rotation speed was 270 msecs and collimation was .9 mm. No beta blockade or nitro were given. The 3D data set was reconstructed in 5% intervals of the R-R cycle. Systolic and diastolic phases were analyzed on a dedicated work station using MPR, MIP and VRT modes. The patient received 80 cc of contrast. FINDINGS: Aortic Valve: A 21 mm Edwards Magna Ease stented bovine bioprosthetic valve is present. Leaflets are thickened and mildly calcified at base. Planimetry shows AVA 1.5 cm2. Sewing ring intact Aorta: Moderate calcified atherosclerosis with normal arch vessels Sinotubular Junction: 25 mm Ascending Thoracic Aorta: 31 mm Aortic Arch: 23 mm Descending Thoracic Aorta: 24 mm Sinus of Valsalva Measurements: Non-coronary: 25.2 mm Right - coronary: 24.2 mm Left - coronary: 24 mm AVR Annulus: 21 mm x 19 mm Pseudo Aneurysm: There is a chronic pseudo aneurysm present with a calcified rim. There is a 12 mm communication from the LVOT to the pseudo aneurysm beginning below the non coronary cusp. The pseudo aneurysm has increased in size since previous CT 03/14/18 In coronal plane measures 3.7 cm x 2.3 cm and over 4 cm in axial plane. There is a large thrombus burden and non circumferential calcified rim. The pseudo aneurysm extends into the RA and involves both the non and right coronary sinuses. It appears to be pushing the proximal RCA outward. The pseudo aneurysm extends superiorly along the anterior greater curvature of the ascending aortic root approximately 3.5 cm There is clear persistent systolic communication  and expansion of the sac. IMPRESSION: 1. 21 mm Edwards Magna Ease stented bovine bioprosthetic valve with intact sewing ring, thickened leaflets and AVA by planimetry 1.5 cm2 2. 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 Size has increased since CT done 03/14/18 Measures 3.7 cm x 2.3 cm in coronal plane and extends 3.5 cm above annulus 3. Surgical considerations should consider aortic root replacement and protection of the RCA with possible grafting or re-implantation as the RCA is intimately involved with the aneurysmal sac Jenkins Rouge Electronically Signed   By: Jenkins Rouge M.D.   On: 04/29/2019 12:51   Result Date: 04/29/2019 EXAM: OVER-READ INTERPRETATION  CT CHEST The following report is an over-read performed by radiologist Dr. Salvatore Marvel of [facility], [place] on 04/29/2019. This over-read does not include interpretation of cardiac or coronary anatomy or pathology. The coronary CTA interpretation by the cardiologist is attached. COMPARISON:  03/08/2018 chest CT angiogram. FINDINGS: Please see the separate concurrent chest CT angiogram report for details. IMPRESSION: Please see the separate concurrent chest CT angiogram report for details. Electronically Signed: By: Ilona Sorrel M.D. On: 04/29/2019 10:15   CT ANGIO CHEST AORTA W/CM & OR WO/CM  Result Date: 04/29/2019 CLINICAL DATA:  History of aortic valve replacement with left ventricular outflow tract pseudoaneurysm, now presenting for TAVR evaluation. EXAM: CT ANGIOGRAPHY CHEST, ABDOMEN AND PELVIS TECHNIQUE: Multidetector CT imaging through the chest, abdomen and pelvis was performed using the standard protocol during bolus administration of intravenous contrast. Multiplanar reconstructed images and MIPs were obtained and reviewed to evaluate the  vascular anatomy. CONTRAST:  122mL OMNIPAQUE IOHEXOL 350 MG/ML SOLN COMPARISON:  03/08/2018 chest CT  angiogram. 03/11/2018 MRI abdomen. FINDINGS: CTA CHEST FINDINGS Cardiovascular: Mild-to-moderate cardiomegaly. No significant pericardial effusion/thickening. Aortic valve prosthesis is in place. 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 Left anterior descending coronary atherosclerosis. Atherosclerotic nonaneurysmal thoracic aorta. Normal caliber pulmonary arteries. No central pulmonary emboli. Mediastinum/Nodes: No discrete thyroid nodules. Unremarkable esophagus. No axillary adenopathy. Enlarged 1.4 cm right paratracheal node (series 5/image 32), unchanged. No new pathologically enlarged mediastinal nodes. No pathologically enlarged hilar nodes. Lungs/Pleura: No pneumothorax. No pleural effusion. No acute consolidative airspace disease, lung masses or significant pulmonary nodules. Stable diffuse bronchial wall thickening. No proximal airway stenoses. Mild osteophyte related fibrosis in the medial right lower lobe. Small parenchymal bands in the mid to lower lungs, compatible with mild scarring or atelectasis. Musculoskeletal: No aggressive appearing focal osseous lesions. Intact sternotomy wires. Moderate thoracic spondylosis. There is a 3.2 x 2.2 cm mass in lateral left back posterior to the body of the scapula with peripheral hyperdensity, appearing intramuscular (series 5/image 23), not appreciably changed since 03/08/2018 chest CT. CTA ABDOMEN AND PELVIS FINDINGS Hepatobiliary: Normal liver with no liver mass. Normal gallbladder with no radiopaque cholelithiasis. No biliary ductal dilatation. Pancreas: Normal, with no mass or duct dilation. Spleen: Normal size. No mass. Adrenals/Urinary Tract: Heterogeneously hyperdense 2.5 cm left adrenal nodule, stable in size since 03/08/2018 CT. Right adrenal 2.0 cm nodule with density 66 HU, stable. No  hydronephrosis. Subcentimeter hypodense posterior lower left renal cortical lesion is too small to characterize and requires no follow-up. Otherwise no contour deforming renal lesions. Normal bladder. Stomach/Bowel: Small hiatal hernia. Otherwise normal nondistended stomach. Normal caliber small bowel with no small bowel wall thickening. Normal appendix. Normal large bowel with no diverticulosis, large bowel wall thickening or pericolonic fat stranding. Vascular/Lymphatic: Atherosclerotic nonaneurysmal abdominal aorta. No pathologically enlarged lymph nodes in the abdomen or pelvis. Reproductive: Grossly normal uterus. Minimally complex 2.3 cm left adnexal cyst with focus of coarse mural calcification (series 5/image 165). No right adnexal mass. Other: No pneumoperitoneum, ascites or focal fluid collection. Musculoskeletal: No aggressive appearing focal osseous lesions. Mild lumbar spondylosis. VASCULAR MEASUREMENTS PERTINENT TO TAVR: AORTA: Minimal Aortic Diameter-14.6 x 13.1 mm Severity of Aortic Calcification-severe RIGHT PELVIS: Right Common Iliac Artery - Minimal Diameter-9.4 x 4.4 mm Tortuosity-mild Calcification-severe Right External Iliac Artery - Minimal Diameter-7.3 x 7.1 mm Tortuosity-mild Calcification-mild Right Common Femoral Artery - Minimal Diameter-7.2 x 5.7 mm Tortuosity-mild Calcification-moderate LEFT PELVIS: Left Common Iliac Artery - Minimal Diameter-7.1 x 4.9 mm Tortuosity-mild Calcification-severe Left External Iliac Artery - Minimal Diameter-7.5 x 6.4 mm Tortuosity-mild Calcification-mild Left Common Femoral Artery - Minimal Diameter-6.7 x 5.7 mm Tortuosity-mild Calcification-moderate Review of the MIP images confirms the above findings. IMPRESSION: 1. Vascular findings and measurements pertinent to potential TAVR procedure, as detailed. 2. Aortic valve prosthesis in place. Left ventricular outflow tract 3.9 x 3.2 cm pseudoaneurysm with peripheral calcification and partial thrombosis, not  substantially changed in size since 03/08/2018 CT, with increased internal thrombus. 3. Mild-to-moderate cardiomegaly. Coronary atherosclerosis. 4. Stable mild mediastinal lymphadenopathy, nonspecific, probably reactive. 5. Stable indeterminate bilateral adrenal nodules, presumably benign such as due to adenomas. 6. Minimally complex 2.3 cm left adnexal cyst with focus of coarse mural calcification. Suggest initial follow-up pelvic ultrasound in 3 months. 7. Indeterminate 3.2 cm soft tissue mass in the lateral left back posterior to the  body of the scapula, stable in size since 03/08/2018 chest CT, presumably benign. 8. Small hiatal hernia. 9.  Aortic Atherosclerosis (ICD10-I70.0). Electronically Signed   By: Ilona Sorrel M.D.   On: 04/29/2019 11:15   ECHO INTRAOPERATIVE TEE  Result Date: 05-27-2019  *INTRAOPERATIVE TRANSESOPHAGEAL REPORT *  Patient Name:   Terri Edwards Date of Exam: May 27, 2019 Medical Rec #:  [id number]          Height:       62.0 in Accession #:    [id number]         Weight:       172.0 lb Date of Birth:  [birth year]          BSA:          1.79 m Patient Age:    31 years           BP:           151/53 mmHg Patient Gender: F                  HR:           71 bpm. Exam Location:  Anesthesiology Transesophogeal exam was perform intraoperatively during surgical procedure. Patient was closely monitored under general anesthesia during the entirety of examination. Indications:     Prosthetic valve dysfunction Performing Phys: Maiden Shem Plemmons Diagnosing Phys: Duane Boston MD Complications: No known complications during this procedure. POST-OP IMPRESSIONS - Left Ventricle: has normal systolic function. - Aortic Valve: A homograft bioprosthetic valve was placed, leaflets are not freely mobile and leaflets thin. No regurgitation post repair. No perivalvular leak noted. - Mitral Valve: The mitral valve appears unchanged from pre-bypass. - Tricuspid Valve: The tricuspid valve appears unchanged from  pre-bypass. - Comments: LVOT false aneurysm no longer visible. Graft and valve in good position. No AI or AS noted. PRE-OP FINDINGS  Left Ventricle: The left ventricle has normal systolic function, with an ejection fraction of 60-65%. The cavity size was normal. There is moderately increased left ventricular wall thickness. False aneurysm of the LV outflowtrack visualized just below the prosthetic valve in the rejoin of the right/noncoronary cusp, pushing into the Right atria/ventricle region. There appeared to be flow into the false aneurysm. Right Ventricle: The right ventricle has normal systolic function. The cavity was normal. There is no increase in right ventricular wall thickness. Left Atrium: Left atrial size was dilated. The left atrial appendage is well visualized and there is no evidence of thrombus present. Right Atrium: Right atrial size was dilated. Right atrial pressure is estimated at 10 mmHg. Interatrial Septum: No atrial level shunt detected by color flow Doppler. Pericardium: There is no evidence of pericardial effusion. Mitral Valve: The mitral valve is normal in structure. Mild thickening of the mitral valve leaflet. Mild calcification of the mitral valve leaflet. Mitral valve regurgitation is mild by color flow Doppler. The MR jet is centrally-directed. There is no evidence of mitral valve vegetation. Tricuspid Valve: The tricuspid valve was normal in structure. Tricuspid valve regurgitation is trivial by color flow Doppler. The tricuspid valve is mildly thickened. The tricuspid valve is mildly calcified. Aortic Valve: Bioprosthetic There is moderate thickening of the aortic valve and There is moderate calcification of the aortic valve Aortic valve regurgitation was not visualized by color flow Doppler. There is moderate stenosis of the aortic valve. Pulmonic Valve: The pulmonic valve was normal in structure. Pulmonic valve regurgitation is trivial by color flow Doppler. Aorta: There is  evidence  of layered plaque in the descending aorta; Grade III, measuring 3-8mm in size. +-------------+---------++ AORTIC VALVE           +-------------+---------++ AV Mean Grad:28.0 mmHg +-------------+---------++  Duane Boston MD Electronically signed by Duane Boston MD Signature Date/Time: 05-17-2019/5:48:58 PM    Final    VAS US DOPPLER PRE CABG  Result Date: 05/02/2019 PREOPERATIVE VASCULAR EVALUATION  Comparison Study: Prior carotid 01/04/16 - not available to view. Performing Technologist: Oda Cogan Chief Tech  Examination Guidelines: A complete evaluation includes B-mode imaging, spectral Doppler, color Doppler, and power Doppler as needed of all accessible portions of each vessel. Bilateral testing is considered an integral part of a complete examination. Limited examinations for reoccurring indications may be performed as noted.  Right Carotid Findings: +----------+--------+--------+--------+--------+--------+           PSV cm/sEDV cm/sStenosisDescribeComments +----------+--------+--------+--------+--------+--------+ CCA Prox  69      17                               +----------+--------+--------+--------+--------+--------+ CCA Distal55      19                               +----------+--------+--------+--------+--------+--------+ ICA Prox  69      20                               +----------+--------+--------+--------+--------+--------+ ICA Distal94      23                               +----------+--------+--------+--------+--------+--------+ ECA       50      12                               +----------+--------+--------+--------+--------+--------+ Portions of this table do not appear on this page. +----------+--------+-------+----------------+------------+           PSV cm/sEDV cmsDescribe        Arm Pressure +----------+--------+-------+----------------+------------+ Subclavian95             Multiphasic, WNL              +----------+--------+-------+----------------+------------+ +---------+--------+--+--------+--+---------+ VertebralPSV cm/s53EDV cm/s17Antegrade +---------+--------+--+--------+--+---------+ Left Carotid Findings: +----------+--------+--------+--------+--------+------------------+           PSV cm/sEDV cm/sStenosisDescribeComments           +----------+--------+--------+--------+--------+------------------+ CCA Prox  95      24                                         +----------+--------+--------+--------+--------+------------------+ CCA Distal73      20                                         +----------+--------+--------+--------+--------+------------------+ ICA Prox  75      31                      intimal thickening +----------+--------+--------+--------+--------+------------------+ ICA Distal115     35  tortuous           +----------+--------+--------+--------+--------+------------------+ ECA       60      15                                         +----------+--------+--------+--------+--------+------------------+ +----------+--------+--------+----------------+------------+ SubclavianPSV cm/sEDV cm/sDescribe        Arm Pressure +----------+--------+--------+----------------+------------+           140             Multiphasic, WNL             +----------+--------+--------+----------------+------------+ +---------+--------+--+--------+--+---------+ VertebralPSV cm/s43EDV cm/s15Antegrade +---------+--------+--+--------+--+---------+  ABI Findings: +--------+------------------+-----+---------+--------+ Right   Rt Pressure (mmHg)IndexWaveform Comment  +--------+------------------+-----+---------+--------+ Brachial                       triphasic         +--------+------------------+-----+---------+--------+ +--------+------------------+-----+---------+-------+ Left    Lt Pressure (mmHg)IndexWaveform Comment  +--------+------------------+-----+---------+-------+ Brachial                       triphasic        +--------+------------------+-----+---------+-------+  Right Doppler Findings: +--------+--------+-----+---------+--------+ Site    PressureIndexDoppler  Comments +--------+--------+-----+---------+--------+ Brachial             triphasic         +--------+--------+-----+---------+--------+ Radial               triphasic         +--------+--------+-----+---------+--------+ Ulnar                biphasic          +--------+--------+-----+---------+--------+  Left Doppler Findings: +--------+--------+-----+---------+--------+ Site    PressureIndexDoppler  Comments +--------+--------+-----+---------+--------+ Brachial             triphasic         +--------+--------+-----+---------+--------+ Radial               triphasic         +--------+--------+-----+---------+--------+ Ulnar                biphasic          +--------+--------+-----+---------+--------+  Summary: Right Carotid: The extracranial vessels were near-normal with only minimal wall                thickening or plaque. Left Carotid: Velocities in the left ICA are consistent with a 1-39% stenosis. Vertebrals:  Bilateral vertebral arteries demonstrate antegrade flow. Subclavians: Normal flow hemodynamics were seen in bilateral subclavian              arteries. Bilateral Extremity: Doppler waveforms remain within normal limits with compression bilaterally for the radial arteries. Doppler waveforms remain within normal limits with compression bilaterally for the ulnar arteries.  Electronically signed by Monica Martinez MD on 05/02/2019 at 11:07:00 AM.    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 EXAM: CT ANGIOGRAPHY CHEST, ABDOMEN AND PELVIS  TECHNIQUE: Multidetector CT imaging through the chest, abdomen and pelvis was performed using the standard protocol during bolus administration of intravenous contrast. Multiplanar reconstructed images and MIPs were obtained and reviewed to evaluate the vascular anatomy. CONTRAST:  162mL OMNIPAQUE IOHEXOL 350 MG/ML SOLN COMPARISON:  03/08/2018 chest CT angiogram. 03/11/2018 MRI  abdomen. FINDINGS: CTA CHEST FINDINGS Cardiovascular: Mild-to-moderate cardiomegaly. No significant pericardial effusion/thickening. Aortic valve prosthesis is in place. 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 Left anterior descending coronary atherosclerosis. Atherosclerotic nonaneurysmal thoracic aorta. Normal caliber pulmonary arteries. No central pulmonary emboli. Mediastinum/Nodes: No discrete thyroid nodules. Unremarkable esophagus. No axillary adenopathy. Enlarged 1.4 cm right paratracheal node (series 5/image 32), unchanged. No new pathologically enlarged mediastinal nodes. No pathologically enlarged hilar nodes. Lungs/Pleura: No pneumothorax. No pleural effusion. No acute consolidative airspace disease, lung masses or significant pulmonary nodules. Stable diffuse bronchial wall thickening. No proximal airway stenoses. Mild osteophyte related fibrosis in the medial right lower lobe. Small parenchymal bands in the mid to lower lungs, compatible with mild scarring or atelectasis. Musculoskeletal: No aggressive appearing focal osseous lesions. Intact sternotomy wires. Moderate thoracic spondylosis. There is a 3.2 x 2.2 cm mass in lateral left back posterior to the body of the scapula with peripheral hyperdensity, appearing intramuscular (series 5/image 23), not appreciably changed since 03/08/2018 chest CT. CTA ABDOMEN AND PELVIS FINDINGS Hepatobiliary: Normal liver with no  liver mass. Normal gallbladder with no radiopaque cholelithiasis. No biliary ductal dilatation. Pancreas: Normal, with no mass or duct dilation. Spleen: Normal size. No mass. Adrenals/Urinary Tract: Heterogeneously hyperdense 2.5 cm left adrenal nodule, stable in size since 03/08/2018 CT. Right adrenal 2.0 cm nodule with density 66 HU, stable. No hydronephrosis. Subcentimeter hypodense posterior lower left renal cortical lesion is too small to characterize and requires no follow-up. Otherwise no contour deforming renal lesions. Normal bladder. Stomach/Bowel: Small hiatal hernia. Otherwise normal nondistended stomach. Normal caliber small bowel with no small bowel wall thickening. Normal appendix. Normal large bowel with no diverticulosis, large bowel wall thickening or pericolonic fat stranding. Vascular/Lymphatic: Atherosclerotic nonaneurysmal abdominal aorta. No pathologically enlarged lymph nodes in the abdomen or pelvis. Reproductive: Grossly normal uterus. Minimally complex 2.3 cm left adnexal cyst with focus of coarse mural calcification (series 5/image 165). No right adnexal mass. Other: No pneumoperitoneum, ascites or focal fluid collection. Musculoskeletal: No aggressive appearing focal osseous lesions. Mild lumbar spondylosis. VASCULAR MEASUREMENTS PERTINENT TO TAVR: AORTA: Minimal Aortic Diameter-14.6 x 13.1 mm Severity of Aortic Calcification-severe RIGHT PELVIS: Right Common Iliac Artery - Minimal Diameter-9.4 x 4.4 mm Tortuosity-mild Calcification-severe Right External Iliac Artery - Minimal Diameter-7.3 x 7.1 mm Tortuosity-mild Calcification-mild Right Common Femoral Artery - Minimal Diameter-7.2 x 5.7 mm Tortuosity-mild Calcification-moderate LEFT PELVIS: Left Common Iliac Artery - Minimal Diameter-7.1 x 4.9 mm Tortuosity-mild Calcification-severe Left External Iliac Artery - Minimal Diameter-7.5 x 6.4 mm Tortuosity-mild Calcification-mild Left Common Femoral Artery - Minimal Diameter-6.7 x 5.7 mm  Tortuosity-mild Calcification-moderate Review of the MIP images confirms the above findings. IMPRESSION: 1. Vascular findings and measurements pertinent to potential TAVR procedure, as detailed. 2. Aortic valve prosthesis in place. 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 3. Mild-to-moderate cardiomegaly. Coronary atherosclerosis. 4. Stable mild mediastinal lymphadenopathy, nonspecific, probably reactive. 5. Stable indeterminate bilateral adrenal nodules, presumably benign such as due to adenomas. 6. Minimally complex 2.3 cm left adnexal cyst with focus of coarse mural calcification. Suggest initial follow-up pelvic ultrasound in 3 months. 7. Indeterminate 3.2 cm soft tissue mass in the lateral left back posterior to the body of the scapula, stable in size since 03/08/2018 chest CT, presumably benign. 8. Small hiatal hernia. 9.  Aortic Atherosclerosis (ICD10-I70.0). Electronically Signed   By: Ilona Sorrel M.D.   On: 04/29/2019 11:15    Microbiology Recent Results (from the past 240 hour(s))  SARS CORONAVIRUS 2 (TAT 6-24 HRS) Nasopharyngeal Nasopharyngeal Swab     Status: None   Collection Time: 05/02/19  8:31 AM   Specimen: Nasopharyngeal Swab  Result Value Ref Range Status   SARS Coronavirus 2 NEGATIVE NEGATIVE Final    Comment: (NOTE) SARS-CoV-2 target nucleic acids are NOT DETECTED. The SARS-CoV-2 RNA is generally detectable in upper and lower respiratory specimens during the acute phase of infection. Negative results do not preclude SARS-CoV-2 infection, do not rule out co-infections with other pathogens, and should not be used as the sole basis for treatment or other patient management decisions. Negative results must be combined with clinical observations, patient history, and epidemiological information. The expected result is  Negative. Fact Sheet for Patients: SugarRoll.be Fact Sheet for Healthcare Providers: https://www.woods-mathews.com/ This test is not yet approved or cleared by the [place] FDA and  has been authorized for detection and/or diagnosis of SARS-CoV-2 by FDA under an Emergency Use Authorization (EUA). This EUA will remain  in effect (meaning this test can be used) for the duration of the COVID-19 declaration under Section 56 4(b)(1) of the Act, 21 U.S.C. section 360bbb-3(b)(1), unless the authorization is terminated or revoked sooner. Performed at [facility], [place] [address], [place], [place] [address]   Surgical pcr screen     Status: None   Collection Time: 05/02/19 11:11 AM   Specimen: Nasal Mucosa; Nasal Swab  Result Value Ref Range Status   MRSA, PCR NEGATIVE NEGATIVE Final   Staphylococcus aureus NEGATIVE NEGATIVE Final    Comment: (NOTE) The Xpert SA Assay (FDA approved for NASAL specimens in patients 10 years of age and older), is one component of a comprehensive surveillance program. It is not intended to diagnose infection nor to guide or monitor treatment. Performed at [facility], [place] [address] [place], [place] [address]     Lab Basic Metabolic Panel: Recent Labs  Lab 05/02/19 1110 05/25/2019 0813 05/25/2019 1117 05-25-2019 1117 2019/05/25 1219 May 25, 2019 1219 05/25/19 1318 05/25/19 1423 25-May-2019 1428 05/25/19 1506 May 25, 2019 1509  NA 140   < > 136   < > 138   < > 136 139 139 141 140  K 4.2   < > 3.9   < > 4.1   < > 3.9 4.7 4.7 4.5 4.5  CL 104   < > 97*  --  99  --  99  --  101  --  105  CO2 25  --   --   --   --   --   --   --   --   --   --   GLUCOSE 96   < > 133*  --  120*  --  114*  --  105*  --  113*  BUN 6*   < > 9  --  8  --  7*  --  7*  --  7*  CREATININE 0.78   < > 0.50  --  0.50  --  0.40*  --  0.40*  --  0.40*  CALCIUM 9.2  --   --   --   --   --   --   --   --   --   --    < > = values in this  interval not displayed.  Liver Function Tests: Recent Labs  Lab 05/02/19 1110  AST 24  ALT 21  ALKPHOS 77  BILITOT 0.8  PROT 6.8  ALBUMIN 3.7   No results for input(s): LIPASE, AMYLASE in the last 168 hours. No results for input(s): AMMONIA in the last 168 hours. CBC: Recent Labs  Lab 05/02/19 1110 05/20/2019 0813 May 20, 2019 1324 May 20, 2019 1324 May 20, 2019 1423 2019-05-20 1428 05-20-19 1506 2019-05-20 1509 20-May-2019 1551  WBC 8.9  --   --   --   --   --   --   --  13.6*  HGB 13.5   < > 8.9*   < > 7.8* 8.2* 8.2* 8.8* 9.6*  HCT 44.8   < > 28.8*   < > 23.0* 24.0* 24.0* 26.0* 30.7*  MCV 95.9  --   --   --   --   --   --   --  96.5  PLT 241  --  91*  --   --   --   --   --  81*   < > = values in this interval not displayed.   Cardiac Enzymes: No results for input(s): CKTOTAL, CKMB, CKMBINDEX, TROPONINI in the last 168 hours. Sepsis Labs: Recent Labs  Lab 05/02/19 1110 05/20/2019 1551  WBC 8.9 13.6*    Procedures/Operations    Redo Aortic Valve Replacement using Human Allograft Aortic Root Graft             [employer] HVA-L Human Aortic Valve (size 23 mm, ID # [id number])             Exclusion Repair False Aneurysm of Left Ventricular Outflow Tract             Reimplantation of Left Main Coronary Artery             Oversewing of Right Coronary Artery             Coronary Artery Bypass Grafting x1             Reversed Greater Saphenous Vein Graft to Distal Right Coronary Artery             Endoscopic Vein Harvest from Right Thigh   Re-exploration of Median Sternotomy at Bedside in ICU   Rexene Alberts, MD 05/20/2019, 6:59 PM

## 2019-05-19 NOTE — Brief Op Note (Signed)
06/03/2019  1:43 PM  PATIENT:  Terri Edwards  69 y.o. 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  POST-OPERATIVE DIAGNOSIS: same  PROCEDURE:  Procedure(s): REDO AORTIC VALVE REPLACEMENT (AVR) (N/A) HUMAN ALLOGRAFT AORTIC ROOT REPLACEMENT USING (N/A) REPAIR OF FALSE ANEURYSM LV OUTFLOW TRACT (N/A) TRANSESOPHAGEAL ECHOCARDIOGRAM (TEE) (N/A)  CABG x 1  SVG to RCA  SURGEON:  Surgeon(s) and Role:    Rexene Alberts, MD - Primary  PHYSICIAN ASSISTANT:  Nicholes Rough, PA-C   ANESTHESIA:   general  EBL:  Per perfusion record   BLOOD ADMINISTERED: 2 u PRBCs 2 u FFP 1 pack platelets  DRAINS: ROUTINE   LOCAL MEDICATIONS USED:  NONE  SPECIMEN:  Source of Specimen:  aortic tissue valve  DISPOSITION OF SPECIMEN:  PATHOLOGY  COUNTS:  YES  DICTATION: .Dragon Dictation  PLAN OF CARE: Admit to inpatient   PATIENT DISPOSITION:  PACU - hemodynamically stable.   Delay start of Pharmacological VTE agent (>24hrs) due to surgical blood loss or risk of bleeding: yes

## 2019-05-19 NOTE — Anesthesia Procedure Notes (Signed)
Arterial Line Insertion Start/End03/02/2020 7:48 AM, 2019/05/30 7:58 AM Performed by: Duane Boston, MD, anesthesiologist  Patient location: OR. Preanesthetic checklist: patient identified, IV checked, site marked, risks and benefits discussed, surgical consent, monitors and equipment checked, pre-op evaluation, timeout performed and anesthesia consent Lidocaine 1% used for infiltration Left, brachial was placed Catheter size: 20 Fr Hand hygiene performed  and maximum sterile barriers used   Attempts: 2 Procedure performed using ultrasound guided technique. Ultrasound Notes:anatomy identified, needle tip was noted to be adjacent to the nerve/plexus identified, no ultrasound evidence of intravascular and/or intraneural injection and image(s) printed for medical record Following insertion, dressing applied and Biopatch. Post procedure assessment: normal and unchanged  Patient tolerated the procedure well with no immediate complications.

## 2019-05-19 NOTE — Progress Notes (Signed)
Patient arrived to unit from OR at 1640. Upon arrival, Patient was moderately stable, but quickly became hypertensive. Medications were titrated in attempt to regain hemodynamic stability. Patient quickly went from hypertensive to hypotensive despite titration of drips in effort to maintain adequate blood pressures within set parameters. See ABG results. MD arrived at bedside. Patient rapidly declined. Code blue called. See code sheet for further documentation.     Alma Friendly, RN / Penny Pia, RN

## 2019-05-19 NOTE — Anesthesia Procedure Notes (Signed)
Central Venous Catheter Insertion Performed by: anesthesiologist Start/End02-28-21 6:48 AM, 2019-05-18 7:58 AM Patient location: Pre-op. Preanesthetic checklist: patient identified, IV checked, site marked, risks and benefits discussed, surgical consent, monitors and equipment checked, pre-op evaluation, timeout performed and anesthesia consent Hand hygiene performed  and maximum sterile barriers used  Catheter size: 8 Fr PA cath was placed.Swan type:thermodilution Procedure performed without using ultrasound guided technique. Ultrasound Notes:anatomy identified, needle tip was noted to be adjacent to the nerve/plexus identified, no ultrasound evidence of intravascular and/or intraneural injection and image(s) printed for medical record Attempts: 1 Following insertion, Biopatch, line sutured and dressing applied. Post procedure assessment: blood return through all ports, free fluid flow and no air  Patient tolerated the procedure well with no immediate complications.

## 2019-05-19 NOTE — Interval H&P Note (Signed)
History and Physical Interval Note:  05/17/19 7:17 AM  Terri Edwards  has presented today for surgery, with the diagnosis of PROSTHETIC VALVE DYSFUNCTION FALSE ANEURYSM LV OUTFLOW TRACT.  The various methods of treatment have been discussed with the patient and family. After consideration of risks, benefits and other options for treatment, the patient has consented to  Procedure(s): REDO AORTIC VALVE REPLACEMENT (AVR) (N/A) POSSIBLE HUMAN ALLOGRAFT AORTIC ROOT REPLACEMENT (N/A) REPAIR OF FALSE ANEURYSM LV OUTFLOW TRACT (N/A) TRANSESOPHAGEAL ECHOCARDIOGRAM (TEE) (N/A) as a surgical intervention.  The patient's history has been reviewed, patient examined, no change in status, stable for surgery.  I have reviewed the patient's chart and labs.  Questions were answered to the patient's satisfaction.     Rexene Alberts

## 2019-05-19 DEATH — deceased

## 2019-05-26 ENCOUNTER — Other Ambulatory Visit: Payer: Self-pay | Admitting: Cardiology

## 2019-05-26 DIAGNOSIS — I480 Paroxysmal atrial fibrillation: Secondary | ICD-10-CM

## 2019-05-26 DIAGNOSIS — F17200 Nicotine dependence, unspecified, uncomplicated: Secondary | ICD-10-CM

## 2019-05-26 DIAGNOSIS — Z953 Presence of xenogenic heart valve: Secondary | ICD-10-CM

## 2019-05-26 DIAGNOSIS — E7800 Pure hypercholesterolemia, unspecified: Secondary | ICD-10-CM

## 2019-06-18 ENCOUNTER — Other Ambulatory Visit: Payer: Self-pay | Admitting: Physician Assistant

## 2019-06-18 DIAGNOSIS — E7800 Pure hypercholesterolemia, unspecified: Secondary | ICD-10-CM

## 2019-06-18 DIAGNOSIS — Z953 Presence of xenogenic heart valve: Secondary | ICD-10-CM

## 2019-06-18 DIAGNOSIS — F17200 Nicotine dependence, unspecified, uncomplicated: Secondary | ICD-10-CM

## 2019-06-18 DIAGNOSIS — I480 Paroxysmal atrial fibrillation: Secondary | ICD-10-CM

## 2019-07-14 ENCOUNTER — Ambulatory Visit: Payer: Medicare Other | Admitting: Cardiology

## 2020-06-21 IMAGING — CT CT HEART MORP W/ CTA COR W/ SCORE W/ CA W/CM &/OR W/O CM
2 of 7 series · 11 of 20 positions shown, 13 images · IV contrast (APPLIED)
Comparison: 03/08/2018 chest CT angiogram.
COMPARISON: 03/08/2018 chest CT angiogram.

Addendum:
EXAM:
OVER-READ INTERPRETATION  CT CHEST

The following report is an over-read performed by radiologist Dr.
Nya Jumper [REDACTED] on 04/29/2019. This over-read
does not include interpretation of cardiac or coronary anatomy or
pathology. The coronary CTA interpretation by the cardiologist is
attached.
CLINICAL DATA: AVR Planning Redo Pseudo Aneurysm
Gated Cardiac CTA
TECHNIQUE: The patient was scanned on a Siemens Force [REDACTED]ice scanner. A 120
kV retrospective scan was triggered in the descending thoracic aorta
at 111 HU's. Gantry rotation speed was 270 msecs and collimation was
.9 mm. No beta blockade or nitro were given. The 3D data set was
reconstructed in 5% intervals of the R-R cycle. Systolic and
diastolic phases were analyzed on a dedicated work station using
MPR, MIP and VRT modes. The patient received 80 cc of contrast.

[Series 7: 0-90% · axial · 0.39mm/px · z∈[+1132,+1293]mm · 5 of 4030 slices shown]
[im 672/4030  vessel]
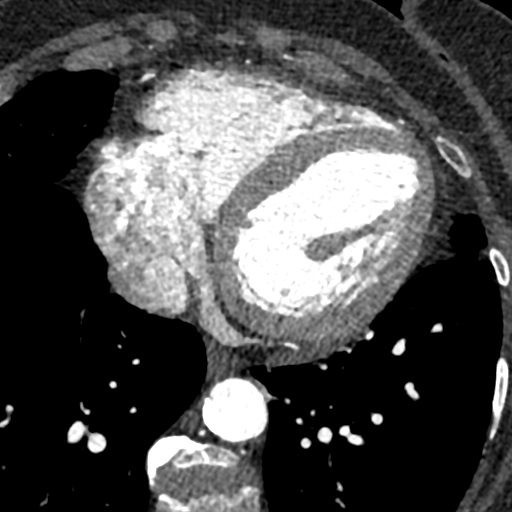
[im 1344/4030  vessel]
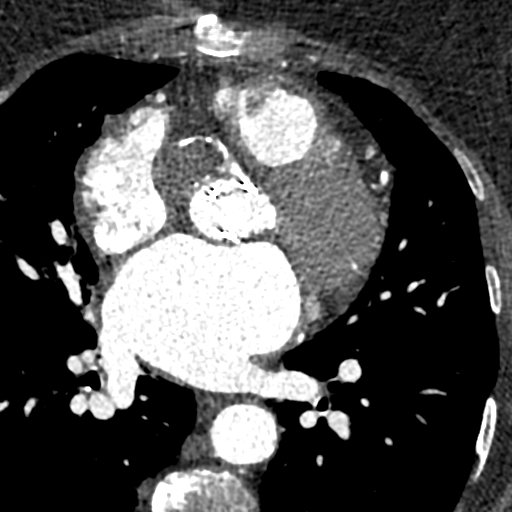
[im 2015/4030  vessel]
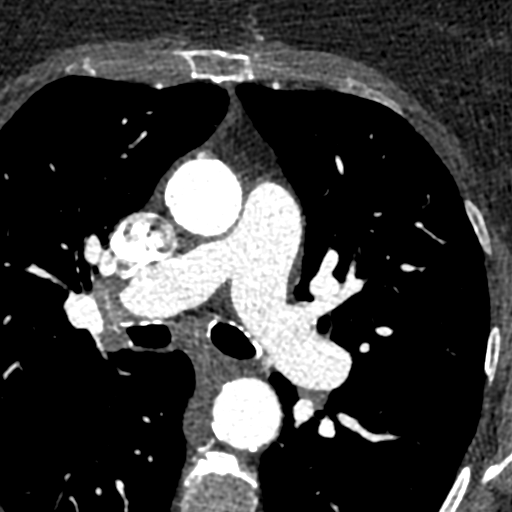
[im 2687/4030  vessel]
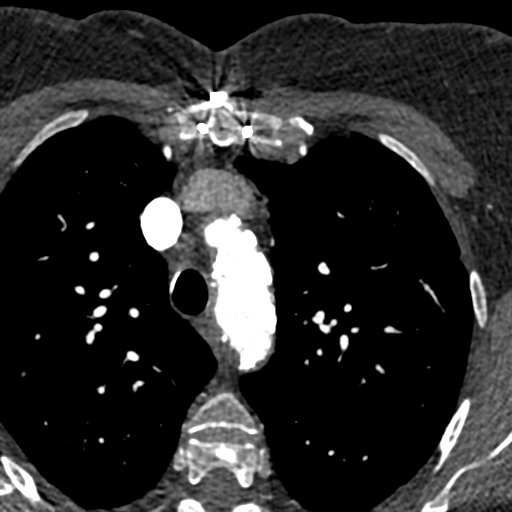
[im 3358/4030  vessel]
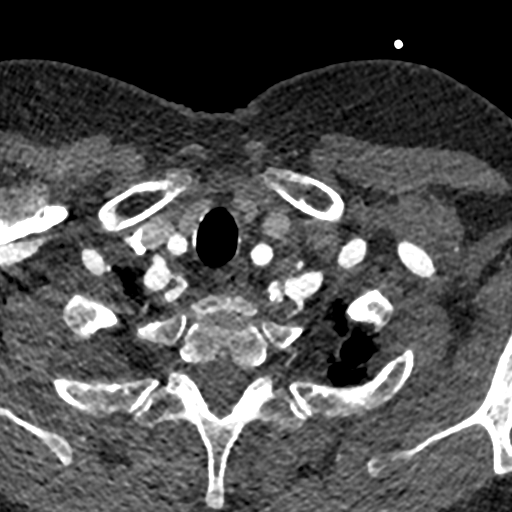

[Series 8: 5-95% · axial · 0.39mm/px · z∈[+1126,+1299]mm · 6 of 4030 slices shown, 8 images]
[im 576/4030  vessel]
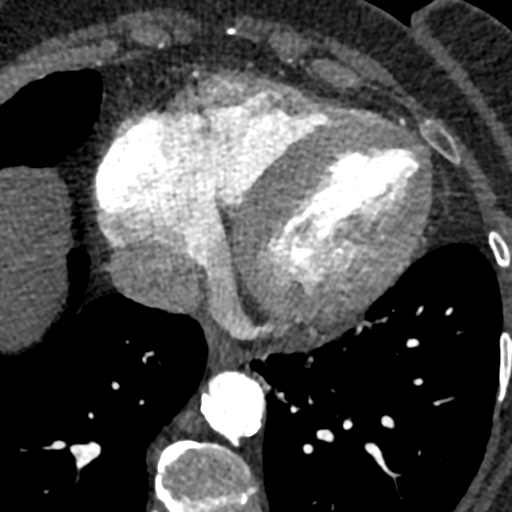
[im 576/4030  lung]
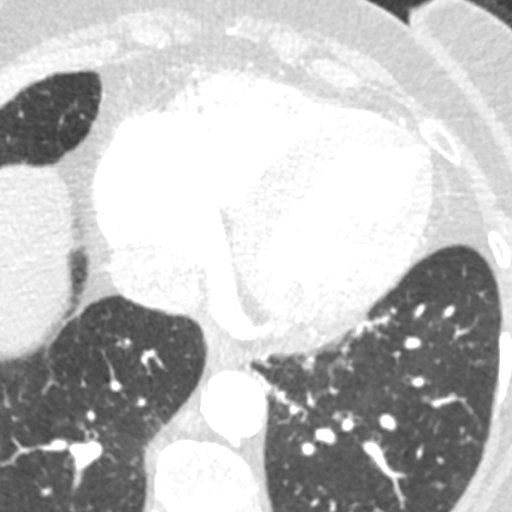
[im 1152/4030  vessel]
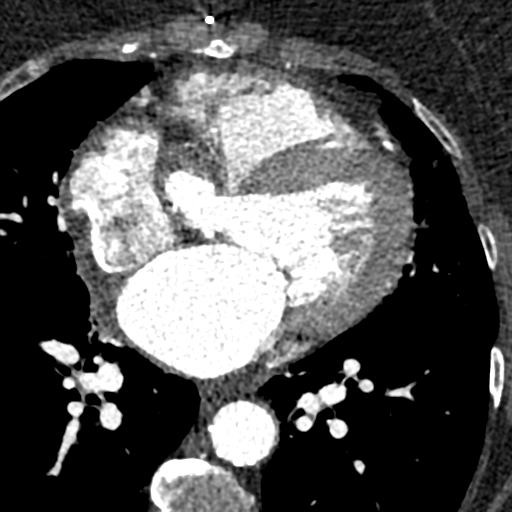
[im 1727/4030  vessel]
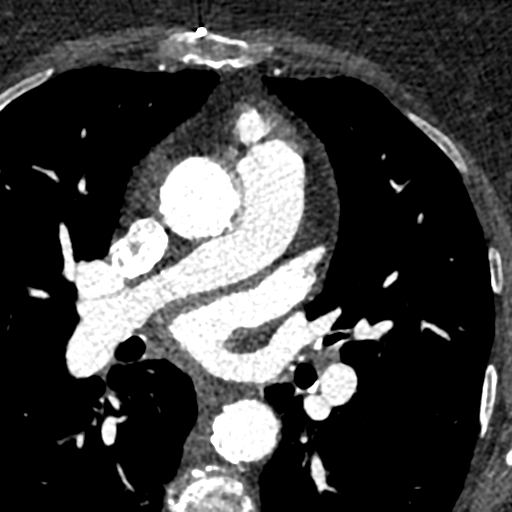
[im 2303/4030  vessel]
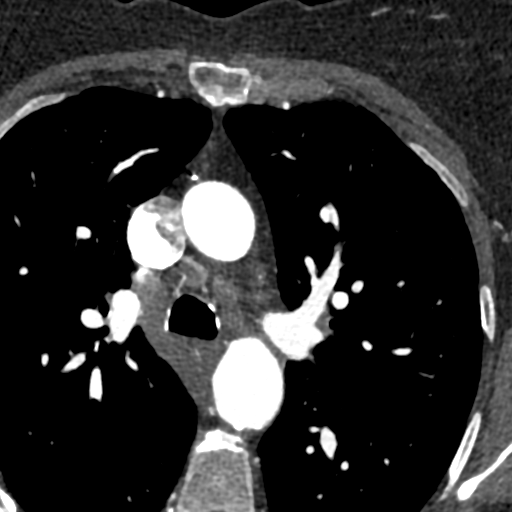
[im 2878/4030  vessel]
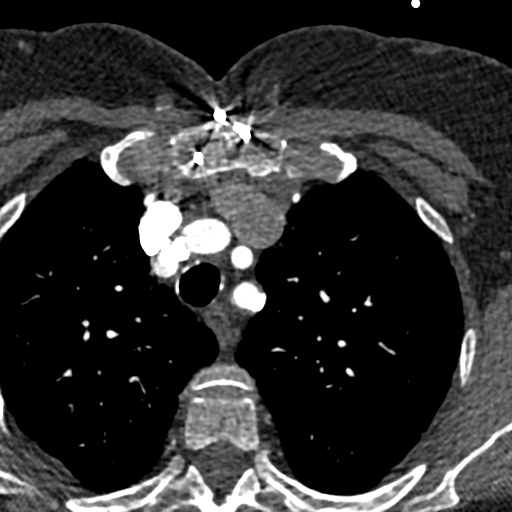
[im 2878/4030  lung]
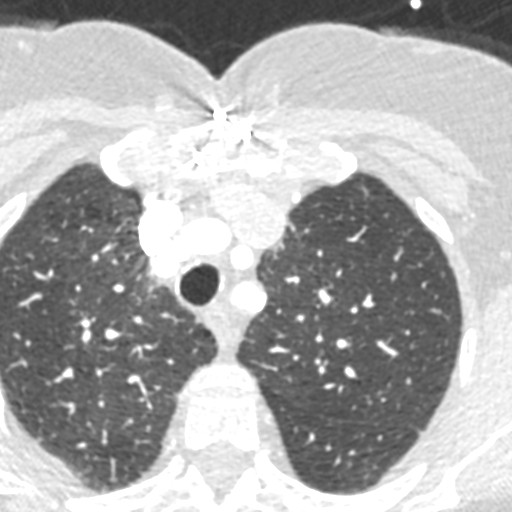
[im 3454/4030  vessel]
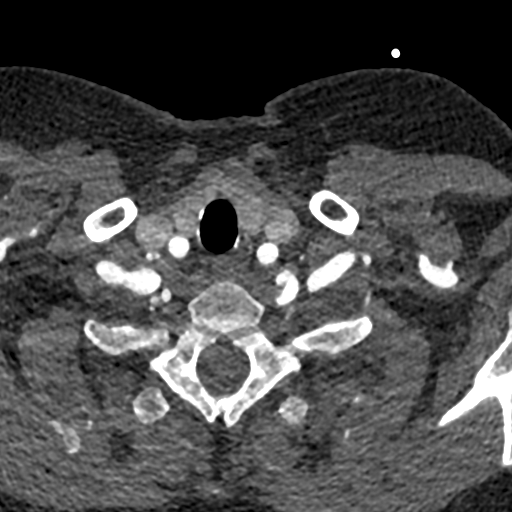

[11 of 20 positions shown; findings below may reference images not displayed]

FINDINGS: Please see the separate concurrent chest CT angiogram report for
details.
IMPRESSION: Please see the separate concurrent chest CT angiogram report for
details.
FINDINGS: Aortic Valve: A 21 mm Tomasa Magna Ease stented bovine
bioprosthetic valve is present. Leaflets are thickened and mildly
calcified at base. Planimetry shows ANENKO 1.5 cm2. Sewing ring intact

Aorta: Moderate calcified atherosclerosis with normal arch vessels

Sinotubular Junction: 25 mm

Ascending Thoracic Aorta: 31 mm

Aortic Arch: 23 mm

Descending Thoracic Aorta: 24 mm

Sinus of Valsalva Measurements:

Non-coronary: 25.2 mm

Right - coronary: 24.2 mm

Left - coronary: 24 mm

AVR Annulus: 21 mm x 19 mm

Pseudo Aneurysm: There is a chronic pseudo aneurysm present with a
calcified rim. There is a 12 mm communication from the LVOT to the
pseudo aneurysm beginning below the non coronary cusp. The pseudo
aneurysm has increased in size since previous CT 03/14/18 In coronal
plane measures 3.7 cm x 2.3 cm and over 4 cm in axial plane. There
is a large thrombus burden and non circumferential calcified rim.
The pseudo aneurysm extends into the RA and involves both the non
and right coronary sinuses. It appears to be pushing the proximal
RCA outward. The pseudo aneurysm extends superiorly along the
anterior greater curvature of the ascending aortic root
approximately 3.5 cm There is clear persistent systolic
communication and expansion of the sac.
IMPRESSION: 1. 21 mm Tomasa Magna Ease stented bovine bioprosthetic valve with
intact sewing ring, thickened leaflets and ANENKO by planimetry 1.5 cm2

2. Large pseudo aneurysm emanating from the LVOT near the non
coronary sinus with large clot burden and non circumferential
calcified ring. Size has increased since CT done 03/14/18 Measures
3.7 cm x 2.3 cm in coronal plane and extends 3.5 cm above annulus

3. Surgical considerations should consider aortic root replacement
and protection of the RCA with possible grafting or re-implantation
as the RCA is intimately involved with the aneurysmal sac

Danii Aujla

*** End of Addendum ***
EXAM:
OVER-READ INTERPRETATION  CT CHEST

The following report is an over-read performed by radiologist Dr.
Nya Jumper [REDACTED] on 04/29/2019. This over-read
does not include interpretation of cardiac or coronary anatomy or
pathology. The coronary CTA interpretation by the cardiologist is
attached.
FINDINGS: Please see the separate concurrent chest CT angiogram report for
details.
IMPRESSION: Please see the separate concurrent chest CT angiogram report for
details.

## 2020-06-21 IMAGING — CT CT ANGIO CHEST
1 of 3 series · 1 of 28 positions shown · IV contrast (omnipaque)
Comparison: 03/08/2018 chest CT angiogram. 03/11/2018 MRI abdomen.

CLINICAL DATA: History of aortic valve replacement with left
ventricular outflow tract pseudoaneurysm, now presenting for TAVR
evaluation.

EXAM:
CT ANGIOGRAPHY CHEST, ABDOMEN AND PELVIS
TECHNIQUE: Multidetector CT imaging through the chest, abdomen and pelvis was
performed using the standard protocol during bolus administration of
intravenous contrast. Multiplanar reconstructed images and MIPs were
obtained and reviewed to evaluate the vascular anatomy.
CONTRAST:  100mL OMNIPAQUE IOHEXOL 350 MG/ML SOLN

[Series 357: — · 0.31mm/px · 1 of 5 slices shown]
[im 3/5]
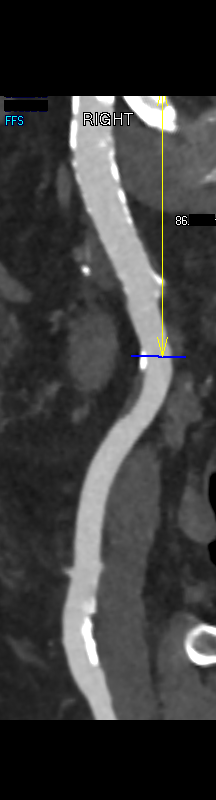

[1 of 28 positions shown; findings below may reference images not displayed]

FINDINGS: CTA CHEST FINDINGS

Cardiovascular: Mild-to-moderate cardiomegaly. No significant
pericardial effusion/thickening. Aortic valve prosthesis is in
place. Left ventricular outflow tract 3.9 x 3.2 cm pseudoaneurysm
with peripheral calcification and partial thrombosis (series 5/image
54), not substantially changed in size, with increased internal
thrombus. Left anterior descending coronary atherosclerosis.
Atherosclerotic nonaneurysmal thoracic aorta. Normal caliber
pulmonary arteries. No central pulmonary emboli.

Mediastinum/Nodes: No discrete thyroid nodules. Unremarkable
esophagus. No axillary adenopathy. Enlarged 1.4 cm right
paratracheal node (series 5/image 32), unchanged. No new
pathologically enlarged mediastinal nodes. No pathologically
enlarged hilar nodes.

Lungs/Pleura: No pneumothorax. No pleural effusion. No acute
consolidative airspace disease, lung masses or significant pulmonary
nodules. Stable diffuse bronchial wall thickening. No proximal
airway stenoses. Mild osteophyte related fibrosis in the medial
right lower lobe. Small parenchymal bands in the mid to lower lungs,
compatible with mild scarring or atelectasis.

Musculoskeletal: No aggressive appearing focal osseous lesions.
Intact sternotomy wires. Moderate thoracic spondylosis. There is a
3.2 x 2.2 cm mass in lateral left back posterior to the body of the
scapula with peripheral hyperdensity, appearing intramuscular
(series 5/image 23), not appreciably changed since 03/08/2018 chest
CT.

CTA ABDOMEN AND PELVIS FINDINGS

Hepatobiliary: Normal liver with no liver mass. Normal gallbladder
with no radiopaque cholelithiasis. No biliary ductal dilatation.

Pancreas: Normal, with no mass or duct dilation.

Spleen: Normal size. No mass.

Adrenals/Urinary Tract: Heterogeneously hyperdense 2.5 cm left
adrenal nodule, stable in size since 03/08/2018 CT. Right adrenal
2.0 cm nodule with density 66 HU, stable. No hydronephrosis.
Subcentimeter hypodense posterior lower left renal cortical lesion
is too small to characterize and requires no follow-up. Otherwise no
contour deforming renal lesions. Normal bladder.

Stomach/Bowel: Small hiatal hernia. Otherwise normal nondistended
stomach. Normal caliber small bowel with no small bowel wall
thickening. Normal appendix. Normal large bowel with no
diverticulosis, large bowel wall thickening or pericolonic fat
stranding.

Vascular/Lymphatic: Atherosclerotic nonaneurysmal abdominal aorta.
No pathologically enlarged lymph nodes in the abdomen or pelvis.

Reproductive: Grossly normal uterus. Minimally complex 2.3 cm left
adnexal cyst with focus of coarse mural calcification (series
5/image 165). No right adnexal mass.

Other: No pneumoperitoneum, ascites or focal fluid collection.

Musculoskeletal: No aggressive appearing focal osseous lesions. Mild
lumbar spondylosis.

VASCULAR MEASUREMENTS PERTINENT TO TAVR:

AORTA:

Minimal Aortic Fiameter-VP.P x 13.1 mm

Severity of Aortic Calcification-severe

RIGHT PELVIS:

Right Common Iliac Artery -

Minimal 6iameter-4.Y x 4.4 mm

Tortuosity-mild

Calcification-severe

Right External Iliac Artery -

Minimal 8iameter-A.6 x 7.1 mm

Tortuosity-mild

Calcification-mild

Right Common Femoral Artery -

Minimal Kiameter-J.9 x 5.7 mm

Tortuosity-mild

Calcification-moderate

LEFT PELVIS:

Left Common Iliac Artery -

Minimal Wiameter-P.5 x 4.9 mm

Tortuosity-mild

Calcification-severe

Left External Iliac Artery -

Minimal Uiameter-N.6 x 6.4 mm

Tortuosity-mild

Calcification-mild

Left Common Femoral Artery -

Minimal Tiameter-5.J x 5.7 mm

Tortuosity-mild

Calcification-moderate

Review of the MIP images confirms the above findings.
IMPRESSION: 1. Vascular findings and measurements pertinent to potential TAVR
procedure, as detailed.
2. Aortic valve prosthesis in place. Left ventricular outflow tract
3.9 x 3.2 cm pseudoaneurysm with peripheral calcification and
partial thrombosis, not substantially changed in size since
03/08/2018 CT, with increased internal thrombus.
3. Mild-to-moderate cardiomegaly. Coronary atherosclerosis.
4. Stable mild mediastinal lymphadenopathy, nonspecific, probably
reactive.
5. Stable indeterminate bilateral adrenal nodules, presumably benign
such as due to adenomas.
6. Minimally complex 2.3 cm left adnexal cyst with focus of coarse
mural calcification. Suggest initial follow-up pelvic ultrasound in
3 months.
7. Indeterminate 3.2 cm soft tissue mass in the lateral left back
posterior to the body of the scapula, stable in size since
03/08/2018 chest CT, presumably benign.
8. Small hiatal hernia.
9.  Aortic Atherosclerosis ([id number]).
